# Patient Record
Sex: Female | Born: 1960
Health system: Southern US, Community
[De-identification: ages and names within clinical notes are randomized; demographics above are authoritative.]

## PROBLEM LIST (undated history)

## (undated) DIAGNOSIS — J302 Other seasonal allergic rhinitis: Secondary | ICD-10-CM

## (undated) DIAGNOSIS — K589 Irritable bowel syndrome without diarrhea: Secondary | ICD-10-CM

## (undated) DIAGNOSIS — T7840XA Allergy, unspecified, initial encounter: Secondary | ICD-10-CM

## (undated) DIAGNOSIS — N289 Disorder of kidney and ureter, unspecified: Secondary | ICD-10-CM

## (undated) DIAGNOSIS — C4490 Unspecified malignant neoplasm of skin, unspecified: Secondary | ICD-10-CM

## (undated) DIAGNOSIS — Z8709 Personal history of other diseases of the respiratory system: Secondary | ICD-10-CM

## (undated) DIAGNOSIS — I10 Essential (primary) hypertension: Secondary | ICD-10-CM

## (undated) DIAGNOSIS — F329 Major depressive disorder, single episode, unspecified: Secondary | ICD-10-CM

## (undated) DIAGNOSIS — R030 Elevated blood-pressure reading, without diagnosis of hypertension: Secondary | ICD-10-CM

## (undated) DIAGNOSIS — Z8742 Personal history of other diseases of the female genital tract: Secondary | ICD-10-CM

## (undated) DIAGNOSIS — R7989 Other specified abnormal findings of blood chemistry: Secondary | ICD-10-CM

## (undated) DIAGNOSIS — S83281A Other tear of lateral meniscus, current injury, right knee, initial encounter: Secondary | ICD-10-CM

## (undated) DIAGNOSIS — Q613 Polycystic kidney, unspecified: Secondary | ICD-10-CM

## (undated) DIAGNOSIS — G35 Multiple sclerosis: Secondary | ICD-10-CM

## (undated) DIAGNOSIS — M7989 Other specified soft tissue disorders: Secondary | ICD-10-CM

## (undated) DIAGNOSIS — K219 Gastro-esophageal reflux disease without esophagitis: Secondary | ICD-10-CM

## (undated) DIAGNOSIS — F419 Anxiety disorder, unspecified: Secondary | ICD-10-CM

## (undated) DIAGNOSIS — E785 Hyperlipidemia, unspecified: Secondary | ICD-10-CM

## (undated) DIAGNOSIS — Z8619 Personal history of other infectious and parasitic diseases: Secondary | ICD-10-CM

## (undated) DIAGNOSIS — G4730 Sleep apnea, unspecified: Secondary | ICD-10-CM

## (undated) DIAGNOSIS — M1990 Unspecified osteoarthritis, unspecified site: Secondary | ICD-10-CM

## (undated) DIAGNOSIS — F32A Depression, unspecified: Secondary | ICD-10-CM

## (undated) DIAGNOSIS — G35D Multiple sclerosis, unspecified: Secondary | ICD-10-CM

## (undated) DIAGNOSIS — G4700 Insomnia, unspecified: Secondary | ICD-10-CM

## (undated) DIAGNOSIS — K760 Fatty (change of) liver, not elsewhere classified: Secondary | ICD-10-CM

## (undated) DIAGNOSIS — M25569 Pain in unspecified knee: Secondary | ICD-10-CM

## (undated) DIAGNOSIS — E559 Vitamin D deficiency, unspecified: Secondary | ICD-10-CM

## (undated) HISTORY — DX: Unspecified malignant neoplasm of skin, unspecified: C44.90

## (undated) HISTORY — DX: Allergy, unspecified, initial encounter: T78.40XA

## (undated) HISTORY — DX: Pain in unspecified knee: M25.569

## (undated) HISTORY — DX: Vitamin D deficiency, unspecified: E55.9

## (undated) HISTORY — DX: Other specified soft tissue disorders: M79.89

## (undated) HISTORY — DX: Gastro-esophageal reflux disease without esophagitis: K21.9

## (undated) HISTORY — DX: Multiple sclerosis, unspecified: G35.D

## (undated) HISTORY — DX: Unspecified osteoarthritis, unspecified site: M19.90

## (undated) HISTORY — DX: Other seasonal allergic rhinitis: J30.2

## (undated) HISTORY — PX: COSMETIC SURGERY: SHX468

## (undated) HISTORY — PX: WISDOM TOOTH EXTRACTION: SHX21

## (undated) HISTORY — DX: Elevated blood-pressure reading, without diagnosis of hypertension: R03.0

## (undated) HISTORY — DX: Polycystic kidney, unspecified: Q61.3

## (undated) HISTORY — PX: COLONOSCOPY: SHX174

## (undated) HISTORY — DX: Hyperlipidemia, unspecified: E78.5

## (undated) HISTORY — DX: Personal history of other diseases of the respiratory system: Z87.09

## (undated) HISTORY — DX: Essential (primary) hypertension: I10

## (undated) HISTORY — DX: Insomnia, unspecified: G47.00

## (undated) HISTORY — DX: Multiple sclerosis: G35

## (undated) HISTORY — DX: Disorder of kidney and ureter, unspecified: N28.9

## (undated) HISTORY — DX: Fatty (change of) liver, not elsewhere classified: K76.0

## (undated) HISTORY — DX: Anxiety disorder, unspecified: F41.9

## (undated) HISTORY — DX: Personal history of other infectious and parasitic diseases: Z86.19

## (undated) HISTORY — DX: Sleep apnea, unspecified: G47.30

## (undated) HISTORY — DX: Irritable bowel syndrome, unspecified: K58.9

## (undated) HISTORY — DX: Personal history of other diseases of the female genital tract: Z87.42

## (undated) HISTORY — PX: UPPER GI ENDOSCOPY: SHX6162

## (undated) HISTORY — PX: OTHER SURGICAL HISTORY: SHX169

---
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
## 1983-01-21 HISTORY — PX: UPPER GI ENDOSCOPY: SHX6162

## 1996-01-21 HISTORY — PX: OTHER SURGICAL HISTORY: SHX169

## 1998-01-20 HISTORY — PX: NASAL SINUS SURGERY: SHX719

## 1998-03-01 ENCOUNTER — Ambulatory Visit (HOSPITAL_COMMUNITY): Admission: RE | Admit: 1998-03-01 | Discharge: 1998-03-01 | Payer: Self-pay | Admitting: Otolaryngology

## 1998-03-01 ENCOUNTER — Encounter: Payer: Self-pay | Admitting: Otolaryngology

## 1998-04-06 ENCOUNTER — Ambulatory Visit (HOSPITAL_BASED_OUTPATIENT_CLINIC_OR_DEPARTMENT_OTHER): Admission: RE | Admit: 1998-04-06 | Discharge: 1998-04-06 | Payer: Self-pay | Admitting: Otolaryngology

## 1999-10-30 ENCOUNTER — Encounter: Payer: Self-pay | Admitting: *Deleted

## 1999-10-30 ENCOUNTER — Ambulatory Visit (HOSPITAL_COMMUNITY): Admission: RE | Admit: 1999-10-30 | Discharge: 1999-10-30 | Payer: Self-pay | Admitting: *Deleted

## 2000-04-23 ENCOUNTER — Other Ambulatory Visit: Admission: RE | Admit: 2000-04-23 | Discharge: 2000-04-23 | Payer: Self-pay | Admitting: Internal Medicine

## 2000-05-08 ENCOUNTER — Encounter: Admission: RE | Admit: 2000-05-08 | Discharge: 2000-05-08 | Payer: Self-pay | Admitting: Internal Medicine

## 2000-05-08 ENCOUNTER — Encounter: Payer: Self-pay | Admitting: Internal Medicine

## 2000-07-01 ENCOUNTER — Encounter: Payer: Self-pay | Admitting: Psychiatry

## 2000-07-01 ENCOUNTER — Ambulatory Visit (HOSPITAL_COMMUNITY): Admission: RE | Admit: 2000-07-01 | Discharge: 2000-07-01 | Payer: Self-pay | Admitting: Psychiatry

## 2001-03-09 ENCOUNTER — Encounter: Payer: Self-pay | Admitting: Psychiatry

## 2001-03-09 ENCOUNTER — Ambulatory Visit (HOSPITAL_COMMUNITY): Admission: RE | Admit: 2001-03-09 | Discharge: 2001-03-09 | Payer: Self-pay | Admitting: Psychiatry

## 2001-04-07 ENCOUNTER — Encounter: Admission: RE | Admit: 2001-04-07 | Discharge: 2001-04-07 | Payer: Self-pay | Admitting: Otolaryngology

## 2001-04-07 ENCOUNTER — Encounter: Payer: Self-pay | Admitting: Otolaryngology

## 2001-05-11 ENCOUNTER — Other Ambulatory Visit: Admission: RE | Admit: 2001-05-11 | Discharge: 2001-05-11 | Payer: Self-pay | Admitting: Internal Medicine

## 2001-11-09 ENCOUNTER — Encounter: Admission: RE | Admit: 2001-11-09 | Discharge: 2001-11-09 | Payer: Self-pay | Admitting: Internal Medicine

## 2001-11-09 ENCOUNTER — Encounter: Payer: Self-pay | Admitting: Internal Medicine

## 2002-05-11 ENCOUNTER — Other Ambulatory Visit: Admission: RE | Admit: 2002-05-11 | Discharge: 2002-05-11 | Payer: Self-pay | Admitting: Internal Medicine

## 2004-01-18 ENCOUNTER — Ambulatory Visit (HOSPITAL_COMMUNITY): Admission: RE | Admit: 2004-01-18 | Discharge: 2004-01-18 | Payer: Self-pay | Admitting: Urology

## 2004-12-31 ENCOUNTER — Encounter: Admission: RE | Admit: 2004-12-31 | Discharge: 2004-12-31 | Payer: Self-pay | Admitting: Internal Medicine

## 2005-10-21 ENCOUNTER — Other Ambulatory Visit: Admission: RE | Admit: 2005-10-21 | Discharge: 2005-10-21 | Payer: Self-pay | Admitting: Internal Medicine

## 2005-10-22 ENCOUNTER — Encounter: Admission: RE | Admit: 2005-10-22 | Discharge: 2005-10-22 | Payer: Self-pay | Admitting: Internal Medicine

## 2006-01-06 ENCOUNTER — Ambulatory Visit (HOSPITAL_BASED_OUTPATIENT_CLINIC_OR_DEPARTMENT_OTHER): Admission: RE | Admit: 2006-01-06 | Discharge: 2006-01-06 | Payer: Self-pay | Admitting: Urology

## 2007-01-21 HISTORY — PX: BLADDER SURGERY: SHX569

## 2008-01-21 HISTORY — PX: VAGINAL HYSTERECTOMY: SUR661

## 2008-01-27 ENCOUNTER — Encounter (HOSPITAL_COMMUNITY): Payer: Self-pay | Admitting: Obstetrics and Gynecology

## 2008-01-27 ENCOUNTER — Inpatient Hospital Stay (HOSPITAL_COMMUNITY): Admission: RE | Admit: 2008-01-27 | Discharge: 2008-01-28 | Payer: Self-pay | Admitting: Obstetrics and Gynecology

## 2008-10-08 ENCOUNTER — Ambulatory Visit (HOSPITAL_BASED_OUTPATIENT_CLINIC_OR_DEPARTMENT_OTHER): Admission: RE | Admit: 2008-10-08 | Discharge: 2008-10-08 | Payer: Self-pay | Admitting: Otolaryngology

## 2008-10-14 ENCOUNTER — Ambulatory Visit: Payer: Self-pay | Admitting: Internal Medicine

## 2009-07-10 ENCOUNTER — Encounter: Admission: RE | Admit: 2009-07-10 | Discharge: 2009-07-10 | Payer: Self-pay | Admitting: Psychiatry

## 2009-07-31 ENCOUNTER — Encounter: Admission: RE | Admit: 2009-07-31 | Discharge: 2009-07-31 | Payer: Self-pay | Admitting: Occupational Medicine

## 2009-09-07 ENCOUNTER — Ambulatory Visit (HOSPITAL_BASED_OUTPATIENT_CLINIC_OR_DEPARTMENT_OTHER): Admission: RE | Admit: 2009-09-07 | Discharge: 2009-09-07 | Payer: Self-pay | Admitting: Orthopedic Surgery

## 2010-02-10 ENCOUNTER — Encounter: Payer: Self-pay | Admitting: Internal Medicine

## 2010-05-06 LAB — CBC
HCT: 31.5 % — ABNORMAL LOW (ref 36.0–46.0)
HCT: 38.8 % (ref 36.0–46.0)
Hemoglobin: 13 g/dL (ref 12.0–15.0)
MCHC: 33.5 g/dL (ref 30.0–36.0)
MCV: 86 fL (ref 78.0–100.0)
Platelets: 179 10*3/uL (ref 150–400)
RBC: 4.51 MIL/uL (ref 3.87–5.11)
RDW: 13.3 % (ref 11.5–15.5)
RDW: 13.7 % (ref 11.5–15.5)
WBC: 14.5 10*3/uL — ABNORMAL HIGH (ref 4.0–10.5)

## 2010-05-06 LAB — COMPREHENSIVE METABOLIC PANEL
ALT: 36 U/L — ABNORMAL HIGH (ref 0–35)
BUN: 15 mg/dL (ref 6–23)
CO2: 26 mEq/L (ref 19–32)
Calcium: 9 mg/dL (ref 8.4–10.5)
Creatinine, Ser: 0.7 mg/dL (ref 0.4–1.2)
GFR calc non Af Amer: >60 mL/min (ref >60)
Glucose, Bld: 97 mg/dL (ref 70–99)
Total Protein: 6.7 g/dL (ref 6.0–8.3)

## 2010-05-06 LAB — ABO/RH: ABO/RH(D): O POS

## 2010-05-06 LAB — TYPE AND SCREEN
ABO/RH(D): O POS
Antibody Screen: NEGATIVE

## 2010-06-04 NOTE — Op Note (Signed)
Emily Davenport, Emily Davenport              ACCOUNT NO.:  [id number]   MEDICAL RECORD NO.:  [id number]          PATIENT TYPE:  INP   LOCATION:  9316                          FACILITY:  [facility]   PHYSICIAN:  Zelphia Cairo, MD    DATE OF BIRTH:  02/28/1960   DATE OF PROCEDURE:  01/27/2008  DATE OF DISCHARGE:                               OPERATIVE REPORT   PREOPERATIVE DIAGNOSES:  1. Menorrhagia  2. Pelvic pain.   POSTOPERATIVE DIAGNOSES:  1. Menorrhagia  2. Pelvic pain.  3. Endometriosis.   PROCEDURE:  Laparoscopic-assisted vaginal hysterectomy and bilateral  salpingo-oophorectomy.   SURGEON:  Zelphia Cairo, MD   ASSISTANT:  Freddy Finner, MD   ESTIMATED BLOOD LOSS:  250 mL.   COMPLICATIONS:  None.   FINDINGS:  Endometriosis implants noted on anterior surface of the bowel  and patient's left ovarian fossa.  Significant evidence of inflammation  consistent with history of endometriosis was noted throughout the lower  pelvis.   CONDITION:  Stable to recovery room.   URINE OUTPUT:  200 mL of clear urine.   PROCEDURE IN DETAIL:  Emily Davenport was taken to the operating room where  general anesthesia was found to be adequate.  She was placed in the  dorsal lithotomy position using Allen stirrups.  She was prepped and  draped in the sterile fashion and a Foley catheter was inserted  sterilely.  A bivalve speculum was placed in the vagina and a single-  tooth tenaculum was placed on the anterior lip of the cervix.  Hulka  clamp was placed for uterine manipulation.  Speculum and tenaculum were  removed and our attention was then turned to the abdomen.  An  infraumbilical skin incision was made with the scalpel and extended to  the level of the fascia using a Kelly clamp bluntly.  Optical trocar was  used to penetrate the fascia and peritoneum under direct visualization.  Once intraperitoneal placement was confirmed, CO2 was turned on and a  survey of the abdomen and pelvis were performed.   Liver and gallbladder  appeared normal.  Bilateral ovaries appeared normal.  She did have  evidence of endometriosis with implants noted along the bowel and left  ovarian fossa.  The tissue within the pelvis also appeared somewhat  thickened consistent with a history of endometriosis and inflammation.  A suprapubic incision was made with a scalpel and a 5-mm trocar was  inserted under direct visualization.  A blunt probe was placed and the  patient was placed in Trendelenburg position.  The bowel was swept free  and the cul-de-sac was found to be clear.  Our attention was turned to  the right adnexa.  A blunt grasper was used to grab the right adnexa and  tented medially.  Right ureter was visualized and free from our  operative field.  The EnSeal device was used to cauterize and cut the  infundibulopelvic ligament.  Hemostasis was assured.  Mesosalpinx and  broad ligament were dissected staying just adjacent to the fallopian  tube and ovary down to the level of the round ligament.  The round  ligament was grasped, cut, and cauterized using the EnSeal device.  Once  hemostasis was assured, our attention was then turned to the left  adnexa.  The left adnexa was grasped and tented upward and medially.  Infundibulopelvic ligament was grasped, cauterized, and cut using the  EnSeal.  This was extended down the level of the mesosalpinx and broad  ligament to the level of the round ligament.  The round ligament was  then grasped, cauterized, and cut using EnSeal.  Hemostasis was assured  and our attention was then turned to the vagina.  CO2 was turned off and  all instruments were removed from the abdomen.   Hulka clamp was removed.  A weighted speculum was placed in the  posterior cul-de-sac and a Deaver was placed in the anterior vagina.  The cervix was grasped with a toothed tenaculum and tented upwards.  The  posterior cul-de-sac was entered sharply using Mayo scissors.  A long  weighted  speculum was then placed in the posterior cul-de-sac.  A  circumferential incision was then made around the cervix.  Bilateral  uterosacral ligaments were clamped with curved Heaney clamps, cut with  Mayo scissors, and suture ligated using 0 Vicryl.  Once hemostasis was  assured, cardinal ligaments were then clamped, transected, and suture  ligated bilaterally.  The uterine arteries and broad ligaments were then  serially clamped with the Heaney clamps, transected and suture ligated.  Anterior cul-de-sac was entered sharply using Mayo scissors.  The Deaver  was placed anteriorly.  The uterus was grasped with a thyroid tenaculum  and delivered through the posterior cul-de-sac.  Cornua were clamped  with Heaney clamps, transected, and the uterus was delivered and passed  off to be sent to Pathology.  Vicryl was used to suture ligate the  remaining pedicles.  Clamps were removed.  The posterior vaginal cuff  was then reapproximated using a running locked stitch of Vicryl.  The  remainder of the vaginal cuff was closed with figure-of-eight stitches  using 0 Monocryl in an interrupted fashion.  Once hemostasis was  assured, our attention was then returned to the abdomen.   Hysteroscope was reinserted into the abdomen.  All pedicles were  inspected and found to be hemostatic.  The pelvis was irrigated and  again vaginal cuff and pedicles appeared hemostatic.  All trocars and  instruments were then removed from the abdomen.  The fascia of the  infraumbilical incision was reapproximated with a figure-of-eight stitch  of 0 Vicryl.  The skin was reapproximated with 3-0 Vicryl.  The patient  was extubated and taken to the recovery room in stable condition.      Zelphia Cairo, MD  Electronically Signed     GA/MEDQ  D:  01/27/2008  T:  01/28/2008  Job:  [id number]

## 2010-06-07 NOTE — Op Note (Signed)
[name], Emily Davenport              ACCOUNT NO.:  [id number]   MEDICAL RECORD NO.:  [id number]          PATIENT TYPE:  AMB   LOCATION:  [facility]C                         FACILITY:  [facility]   PHYSICIAN:  Jamison Neighbor, M.D.  DATE OF BIRTH:  06/02/1960   DATE OF PROCEDURE:  01/06/2006  DATE OF DISCHARGE:                               OPERATIVE REPORT   SERVICE:  Urology.   PREOPERATIVE DIAGNOSIS:  Stress urinary incontinence.   POSTOPERATIVE DIAGNOSIS:  Stress urinary incontinence.   PROCEDURE:  Cystoscopy and transobturator sling Conservation officer, historic buildings).   SURGEON:  Dr. Marcelyn Bruins.   ANESTHESIA:  General.   COMPLICATIONS:  None.   DRAINS:  None.   BRIEF HISTORY:  This 50 year old female underwent urodynamic testing  because of urinary incontinence. She has clear cut stress incontinence  without any evidence of urgency or frequency.  The patient had no  evidence of cystocele or significant rectocele requiring additional  reconstruction. She is now to undergo placement of a sling.  The patient  understands the risks and benefits of the procedure and gave full  informed consent.   DESCRIPTION OF PROCEDURE:  After successful induction of general  anesthesia, the patient was placed in the dorsal position, prepped with  Betadine and draped in the usual sterile fashion.  A Foley catheter was  inserted and the bladder was placed to straight drainage.  A weighted  vaginal speculum was placed.  The area over the mid urethra was  infiltrated with local anesthetic and an incision was made over the mid  urethra extending back towards the bladder neck. Flaps were raised  bilaterally going back to but not through the endopelvic fascia. An  incision was made in the groin crease at the level of the clitoris and a  curved needle was passed from the skin incision to the vaginal opening  bilaterally.  Care was taken to ensure that this did not penetrate the  posterior sulcus. Cystoscopy was  performed first with the 70 degree lens  and then with the 12 degree which showed there was no injury to the  bladder.  The sling was then placed and brought out through the two  incisions. The tension was set by keeping a right angled clamp  underneath the sling. Inspection showed that the bladder neck was well  coapted and with a full bladder and Crede maneuver, there was a straight  flow of urine without deviation. The sheath was cut away and the sling  tension was set. The ends were trimmed at that the skin and the skin  incisions were closed with Dermabond.  The vaginal area was irrigated  with antibiotic solution and then closed with a 2-0 Vicryl stitch. The  patient tolerated the procedure well and was taken to the recovery room  in good condition.  Packing was left in place, Foley catheter  was removed.  The patient will be given the opportunity to urinate and  if she urinates normally she will be sent home without a catheter.  She  has a prescription for Tylox and doxycycline. She will return to the  office in 2-3 weeks time.           ______________________________  Jamison Neighbor, M.D.  Electronically Signed     RJE/MEDQ  D:  01/06/2006  T:  01/06/2006  Job:  [id number]

## 2010-06-14 ENCOUNTER — Encounter: Payer: Self-pay | Admitting: Family Medicine

## 2010-06-14 ENCOUNTER — Ambulatory Visit (INDEPENDENT_AMBULATORY_CARE_PROVIDER_SITE_OTHER): Payer: Worker's Compensation | Admitting: Family Medicine

## 2010-06-14 DIAGNOSIS — S93401A Sprain of unspecified ligament of right ankle, initial encounter: Secondary | ICD-10-CM

## 2010-06-14 DIAGNOSIS — M7671 Peroneal tendinitis, right leg: Secondary | ICD-10-CM | POA: Insufficient documentation

## 2010-06-14 DIAGNOSIS — S93409A Sprain of unspecified ligament of unspecified ankle, initial encounter: Secondary | ICD-10-CM

## 2010-06-14 DIAGNOSIS — M7750 Other enthesopathy of unspecified foot: Secondary | ICD-10-CM

## 2010-06-14 DIAGNOSIS — M25571 Pain in right ankle and joints of right foot: Secondary | ICD-10-CM | POA: Insufficient documentation

## 2010-06-14 MED ORDER — NITROGLYCERIN 0.2 MG/HR TD PT24: MEDICATED_PATCH | TRANSDERMAL | Status: DC

## 2010-06-14 NOTE — Assessment & Plan Note (Signed)
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 - Reviewed home exercise program as noted above and initiated nitroglycerin protocol - Should use her ASO brace for added support with activity. Feel her work boots will give her ankle reasonable stability. - Followup with [place] in 6 weeks for reevaluation.

## 2010-06-14 NOTE — Assessment & Plan Note (Signed)
Chronic right peroneal tendinitis status post right inversion ankle sprain 06/30/09 - MSK ultrasound today showed significant amount of fluid surrounding the peroneal tendons, but no visible signs of tendon tear. Does also appear to have small tear in syndesmosis consistent with a high ankle sprain which likely occurred during injury. - Given chronic nature of symptoms will start her on nitroglycerin protocol - explain common side effects to patient and she expressed understanding. - Reviewed home exercise program to focus on strengthening - handout given, should do these exercises daily. - Would avoid any heavy running or impact activities for the next 3-4 weeks, it is okay to use stationary bike. - Should ice after activity. - May continue work without any restrictions. - Followup with [place] in 6 weeks for reevaluation

## 2010-06-14 NOTE — Progress Notes (Signed)
  Subjective:    Patient ID: Emily Davenport, female    DOB: 06-06-1960, 50 y.o.   MRN: [id number]  HPI 50 year old female referred to the office by Dr. Lucia Gaskins for evaluation of right ankle pain. 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  She had immediate pain and swelling and this has persisted for the past several months. Initial x-rays from 07/31/09 showed no signs of fracture or other abnormality. Completed extensive physical therapy without significant improvement. She has returned to work at a full duty capacity but notices increasing pain and lateral ankle swelling at the end of the day. She will have occasional snapping and popping in the anterior lateral aspect of her ankle. He recently completed 6-8 sessions of physical therapy with home exercise program, range of motion, balance exercises, but she was not making any progress. She's not currently taking any medications. Was initially using an ASO brace, but is not doing so at this time. Denies any previous injury to her foot/ankle.  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  All records from [facility] reviewed during visit today & will be scanned into patient chart.  Review of Systems  per history of present illness, otherwise negative     Objective:   Physical Exam  GENERAL: Alert and oriented x3, no acute chest, pleasant, well-developed, well-nourished SKIN: No rashes or lesions MSK:  - Ankle: Right ankle with full range of motion with mild pain with resisted inversion and eversion. Noticeable soft tissue swelling anterior laterally over the ATFL and peroneal tendons posterior to the lateral malleolus. She is tender to palpation over the  ATFL, syndesmosis, peroneal tendons posterior to the lateral malleolus. She has no tenderness over the lateral or medial malleolus or the navicular. 1+ anterior drawer and 1+ talar tilt. Positive Kleiger, negative Squeeze.  +4/5 weakness of the ankle with inversion, eversion, dorsiflexion, plantar flexion. No tenderness over the calcaneus or the Achilles. Left ankle with full range of motion without pain, swelling, tenderness, weakness, instability. - Gait: Normal gait without a limp  Neurovascularly intact distally   MSK ultrasound: Right ankle with normal-appearing medial and lateral talar domes. Normal-appearing ATFL. Appears to have hypoechoic change with in syndesmosis consistent with small tear in the syndesmosis.  Peroneal tendons are well visualized in long and transverse views with large amounts of hypoechoic fluid surrounding tendons and area posterior to lateral malleolus extending to the insertion on the base of the fifth metatarsal. No signs of peroneal tearing. No significant increased Doppler flow appreciated. Images saved.        Assessment & Plan:

## 2010-06-14 NOTE — Patient Instructions (Addendum)
1) You have chronic peroneal tendonitis & small tear of your ankle syndesmosis. 2) Start using Nitroglycerin patch apply 1/4 patch to right ankle daily.  Change every 24-hours.  Most common side effects are rash and/or headache.  If headache occurs will usually be within 1st 2-weeks, but should respond to tylenol or ibuprofen.  Call with any questions. 3) Ice at end of the day 4) Ok to use stationary bike 5) Do exercises as shown - stretches, heel raises, balance, & strengthening exercises 6) Follow-up in 6-weeks for repeat ultrasound 7) Call with any questions/concerns.

## 2010-07-26 ENCOUNTER — Ambulatory Visit: Payer: Worker's Compensation | Admitting: Family Medicine

## 2010-09-16 ENCOUNTER — Other Ambulatory Visit: Payer: Self-pay | Admitting: Psychiatry

## 2010-09-16 DIAGNOSIS — G35 Multiple sclerosis: Secondary | ICD-10-CM

## 2010-09-24 ENCOUNTER — Ambulatory Visit
Admission: RE | Admit: 2010-09-24 | Discharge: 2010-09-24 | Disposition: A | Discharge status: Home / Self Care | Payer: 59 | Source: Ambulatory Visit | Attending: Psychiatry | Admitting: Psychiatry

## 2010-09-24 DIAGNOSIS — G35 Multiple sclerosis: Secondary | ICD-10-CM

## 2010-09-24 MED ORDER — GADOBENATE DIMEGLUMINE 529 MG/ML IV SOLN
20.0000 mL | Freq: Once | INTRAVENOUS | Status: AC | PRN
  Administered 2010-09-24: 20 mL via INTRAVENOUS

## 2010-10-02 ENCOUNTER — Encounter: Payer: 59 | Admitting: Sports Medicine

## 2010-10-08 ENCOUNTER — Encounter: Payer: Self-pay | Admitting: Sports Medicine

## 2010-10-08 ENCOUNTER — Ambulatory Visit (INDEPENDENT_AMBULATORY_CARE_PROVIDER_SITE_OTHER): Payer: 59 | Admitting: Sports Medicine

## 2010-10-08 VITALS — BP 120/70

## 2010-10-08 DIAGNOSIS — M25579 Pain in unspecified ankle and joints of unspecified foot: Secondary | ICD-10-CM

## 2010-10-08 DIAGNOSIS — M25571 Pain in right ankle and joints of right foot: Secondary | ICD-10-CM

## 2010-10-08 MED ORDER — MELOXICAM 15 MG PO TABS: 15.0000 mg | ORAL_TABLET | Freq: Every day | ORAL | Status: AC

## 2010-10-08 NOTE — Progress Notes (Addendum)
  Subjective:    Patient ID: Emily Davenport, female    DOB: [birth year], 50 y.o.   MRN: [id number]  HPI The patient comes in with complaints of right ankle pain that she has had since June 11 of 2011. Her employer is [employer] city [employer]. She initially remembers an inversion injury, while at work, and initial x-rays were negative. Since then she has been undergoing extensive physical therapy, has been seen here once in the past, and was prescribed nitroglycerin patches. Ultrasound at that point showed a peroneal tendinitis. She returns today, approximately 4 months after her last visit, no better. She has been doing the rehabilitation exercises, as well as using nitroglycerin as prescribed. She describes the pain or dominantly over the medial and lateral shoulders of the tibialis on the right ankle. She gets swelling, catching, freezing, and popping.     Review of Systems    no fevers, chills, night sweats, weight loss. Objective:   Physical Exam General: Well-developed, overweight Caucasian female in no acute distress. Skin: Warm and dry. Neuro: Alert and oriented x3, extraocular muscles intact. Respiratory: No use of accessory muscles. MSK: Right Ankle: No visible erythema and minimal swelling. Range of motion is full in all directions. Strength is 5/5 in all directions. She does have some pain with resisted eversion Stable lateral and medial ligaments; squeeze test and kleiger test unremarkable; Talar dome very tender to palpation over medial and lateral shoulders No pain at base of 5th MT; No tenderness over cuboid; No tenderness over N spot or navicular prominence No tenderness on posterior aspects of lateral and medial malleolus No sign of peroneal tendon subluxations or tenderness to palpation Negative tarsal tunnel tinel's Able to walk 4 steps.  MSK ultrasound: Imaged the posterior tibial tendons, peroneal tendons, extensor tendons, ankle joint. I still noted some fluid  around her peroneal tendons, however I was able to follow them down to their insertions. There were no signs of the tear noted and there is no tenderness on palpation of the tendons. Posterior tibialis, flexor hallucis, flexor digitorum tendons were all intact without halos. There was a small amount of fluid in the ankle joint itself. The lateral talar dome was unremarkable, however the medial talar dome did show some cortical irregularity.  Real-time Ultrasound Guided Injection of: Right Ankle joint Verbal Informed Consent obtained. Time-out conducted. Noted no overlying erythema, induration, or other signs of local infection. Prepped in a sterile fashion with alcohol. Local anesthesia: None With sterile technique and under real time ultrasound guidance: The needle was seen in a short axis advancing into the joint space. I took an image of the needle in the joint space, as well as after injection of medication. Completed without difficulty Meds:  1 cc Depo-Medrol, 2 cc lidocaine 1%. Needle: 25 g Pain almost completely resolved. Advised to call if fevers/chills, erythema, induration, drainage, or persistent bleeding.                    Assessment & Plan:

## 2010-10-08 NOTE — Assessment & Plan Note (Addendum)
I suspect that her peroneal tendinitis has resolved. The nitroglycerin patches were ineffective in this patient and will be discontinued. With her symptoms of catching, as well as with pain at the talar domes, as well as a cortical irregularity on ultrasound I do have a worry of a chondral lesion of the talar dome. Ultrasound guided corticosteroid injection of the ankle joint was performed today for diagnostic as well as therapeutic benefit. She did have a significant improvement in her pain after the injection suggesting an intra-articular process. She will take it easy, with no walking or standing more than 30 minutes for a week. She will also take meloxicam 15 mg daily for pain. I will see her back in a period of 2 weeks, and if better we'll release her, if no better will MRI ankle.

## 2010-10-23 ENCOUNTER — Encounter: Payer: 59 | Admitting: Sports Medicine

## 2010-11-05 ENCOUNTER — Ambulatory Visit (INDEPENDENT_AMBULATORY_CARE_PROVIDER_SITE_OTHER): Payer: Self-pay | Admitting: Sports Medicine

## 2010-11-05 VITALS — BP 124/84

## 2010-11-05 DIAGNOSIS — M25571 Pain in right ankle and joints of right foot: Secondary | ICD-10-CM

## 2010-11-05 DIAGNOSIS — M25579 Pain in unspecified ankle and joints of unspecified foot: Secondary | ICD-10-CM

## 2010-11-05 NOTE — Patient Instructions (Signed)
Great to see you, I am glad you are feeling significantly better. Continue wearing sports insoles. We will do an MRI of your ankle. I will let you know what the results are, and what we need to do after that. Come back to see me in 6 weeks.  Ihor Austin. Benjamin Stain, M.D. [facility] [address], [place] [address] [phone]

## 2010-11-05 NOTE — Assessment & Plan Note (Addendum)
Improved significantly since intra-articular ankle injection at last visit.  However the persistent pain and last visit's US findings make me concerned for a chondral defect of the medial talar dome .  Will order an MRI of the ankle to confirm any chondral defect. If Chondral defect found, will likely need ankle arthroscopy. Patient is to continue to tame Meloxicam for the pain.  If MRI negative, patient likely meets MMI.    I agree with the above documentation and evaluation by the resident. Ihor Austin. Benjamin Stain, M.D.

## 2010-11-05 NOTE — Progress Notes (Signed)
  Subjective:    Patient ID: Emily Davenport, female    DOB: 11-13-1960, 50 y.o.   MRN: [id number]  HPI  Emily Davenport comes in for followup of her right ankle injury. She says that since the injection she here ankle it is feeling 75% better. She says that she had that pain in the middle of her ankle as well as on the medial side, and it feels better in the middle, where the injection was placed. She feels like on a medial side it is still a little swollen. She says that it hurts particularly when she tries to wear high heels and when she does her home exercises inversion exercises hurt.  She is taking meloxicam for the pain, which she says helps. She has also been using the green sports and soles and says they have helped a lot. She is still doing a home exercise program for her ankle.  She is working her regular job right now, and she is a Emergency planning/management officer at [employer].    Review of Systems  per history of present illness.     Objective:   Physical Exam BP 124/84 General appearance: alert, cooperative and no distress MSK: Right ankle tenderness anterior to the medial malleolus Right ankle pain with inversion and resistance Negative drawer test of right ankle and no ligamentous laxity noted.       Assessment & Plan:

## 2010-11-05 NOTE — Progress Notes (Signed)
Of note she is significant tenderness to palpation of the medial talar dome. Is no signs of an ankle effusion. She no longer has tenderness to palpation over her peroneal muscles. Negative Kleiger, negative squeeze, negative anterior drawer. She ambulates with a nonantalgic gait.

## 2010-11-19 ENCOUNTER — Encounter: Payer: Self-pay | Admitting: Sports Medicine

## 2010-11-19 ENCOUNTER — Ambulatory Visit (INDEPENDENT_AMBULATORY_CARE_PROVIDER_SITE_OTHER): Payer: Worker's Compensation | Admitting: Sports Medicine

## 2010-11-19 VITALS — BP 130/84

## 2010-11-19 DIAGNOSIS — M25571 Pain in right ankle and joints of right foot: Secondary | ICD-10-CM

## 2010-11-19 DIAGNOSIS — M25579 Pain in unspecified ankle and joints of unspecified foot: Secondary | ICD-10-CM

## 2010-11-19 NOTE — Progress Notes (Signed)
  Subjective:    Patient ID: Emily Davenport, female    DOB: 01/30/1960, 50 y.o.   MRN: [id number]  HPI R ankle injected 6 wks ago. 75% better, however this seems to have plateaued. MRI was obtained of the ankle, which shows some sinus tarsi syndrome, as well as a small amount of fluid in the joint. Otherwise negative. We did however notice what may be a small synovial cyst at the medial tibiotalar joint.  Social history: No alcohol, tobacco, drugs. Works as a Emergency planning/management officer. Review of Systems    fevers, chills, night sweats, weight loss. Objective:   Physical Exam General: Well-developed, overweight Caucasian female in no acute distress. Skin: Warm and dry. Neuro: Alert and oriented x3, extraocular muscles intact. Respiratory: No use of accessory muscles. MSK: Right Ankle: No visible erythema and minimal swelling. Range of motion is full in all directions. Strength is 5/5 in all directions. She does have some pain with resisted eversion Stable lateral and medial ligaments; squeeze test and kleiger test unremarkable; Talar dome very tender to palpation over medial shoulder No pain at base of 5th MT; No tenderness over cuboid; No tenderness over N spot or navicular prominence No tenderness on posterior aspects of lateral and medial malleolus No sign of peroneal tendon subluxations or tenderness to palpation Negative tarsal tunnel tinel's Able to walk 4 steps.  Real-time Ultrasound Guided Injection of: Right Ankle joint Verbal Informed Consent obtained. Time-out conducted. Noted no overlying erythema, induration, or other signs of local infection. Prepped in a sterile fashion with alcohol. Local anesthesia: None With sterile technique and under real time ultrasound guidance: The needle was seen in a short axis advancing into the joint space medially. I took an image of the needle in the joint space, as well as after injection of medication. Completed without difficulty Meds:  1 cc  Depo-Medrol, 1 cc lidocaine 1%. Needle: 25 g Pain almost completely resolved. Advised to call if fevers/chills, erythema, induration, drainage, or persistent bleeding.     Assessment & Plan:   1. Ankle pain: Status post injection #2 today. I would like her to call us back in about a week and a half to 2 weeks, if better great, if no better it would be time to proceed with ankle arthroscopy and we would do a referral at that time to [facility] for orthopedics, Dr. Luiz Blare.

## 2010-11-27 ENCOUNTER — Encounter: Payer: Self-pay | Admitting: Sports Medicine

## 2010-12-17 ENCOUNTER — Encounter: Payer: 59 | Admitting: Sports Medicine

## 2010-12-17 ENCOUNTER — Telehealth: Payer: Self-pay | Admitting: Sports Medicine

## 2010-12-17 DIAGNOSIS — M25579 Pain in unspecified ankle and joints of unspecified foot: Secondary | ICD-10-CM

## 2010-12-17 NOTE — Telephone Encounter (Addendum)
Please see prior notes, spoke with patient she has not improved despite 2 injections, and has a negative MRI. She needs an ankle arthroscopy. This necessitates a referral to [facility], Dr. Luiz Blare.

## 2010-12-18 NOTE — Telephone Encounter (Signed)
Spoke with Noralee Chars @ city [facility] [place].  Faxed over records, and recommendations from Dr. Karie Schwalbe for referral to Dr. Luiz Blare @ [facility].  She states she will arrange appt and let us know.

## 2012-01-21 HISTORY — PX: COLONOSCOPY: SHX174

## 2012-01-21 LAB — HM COLONOSCOPY: HM Colonoscopy: NORMAL

## 2012-05-31 ENCOUNTER — Other Ambulatory Visit: Payer: Self-pay | Admitting: Internal Medicine

## 2012-05-31 ENCOUNTER — Ambulatory Visit
Admission: RE | Admit: 2012-05-31 | Discharge: 2012-05-31 | Disposition: A | Discharge status: Home / Self Care | Payer: Self-pay | Source: Ambulatory Visit | Attending: Internal Medicine | Admitting: Internal Medicine

## 2012-05-31 DIAGNOSIS — M549 Dorsalgia, unspecified: Secondary | ICD-10-CM

## 2012-06-09 ENCOUNTER — Other Ambulatory Visit: Payer: Self-pay | Admitting: Internal Medicine

## 2012-06-09 DIAGNOSIS — R194 Change in bowel habit: Secondary | ICD-10-CM

## 2012-06-09 DIAGNOSIS — R109 Unspecified abdominal pain: Secondary | ICD-10-CM

## 2012-06-09 DIAGNOSIS — R5383 Other fatigue: Secondary | ICD-10-CM

## 2012-06-09 DIAGNOSIS — R634 Abnormal weight loss: Secondary | ICD-10-CM

## 2012-06-10 ENCOUNTER — Ambulatory Visit
Admission: RE | Admit: 2012-06-10 | Discharge: 2012-06-10 | Disposition: A | Discharge status: Home / Self Care | Payer: 59 | Source: Ambulatory Visit | Attending: Internal Medicine | Admitting: Internal Medicine

## 2012-06-10 ENCOUNTER — Other Ambulatory Visit: Payer: Self-pay | Admitting: Internal Medicine

## 2012-06-10 DIAGNOSIS — R109 Unspecified abdominal pain: Secondary | ICD-10-CM

## 2012-06-10 DIAGNOSIS — R634 Abnormal weight loss: Secondary | ICD-10-CM

## 2012-06-10 DIAGNOSIS — R194 Change in bowel habit: Secondary | ICD-10-CM

## 2012-06-10 DIAGNOSIS — R5383 Other fatigue: Secondary | ICD-10-CM

## 2012-06-10 MED ORDER — IOHEXOL 300 MG/ML  SOLN
100.0000 mL | Freq: Once | INTRAMUSCULAR | Status: AC | PRN
  Administered 2012-06-10: 100 mL via INTRAVENOUS

## 2012-09-10 ENCOUNTER — Telehealth: Payer: Self-pay | Admitting: Hematology & Oncology

## 2012-09-10 NOTE — Telephone Encounter (Signed)
Pt called wants to see Dr. Myna Hidalgo. She said her MD yesterday told her she may have multiple myeloma. I told her to have her MD fax notes and labs. She said she would

## 2012-09-10 NOTE — Telephone Encounter (Signed)
Pt aware Dr. Myna Hidalgo reviewed her chart and said PCP should work her up and that he felt he didn't need to see her at this time.

## 2012-09-13 ENCOUNTER — Telehealth: Payer: Self-pay | Admitting: Hematology & Oncology

## 2012-09-13 NOTE — Telephone Encounter (Signed)
Pt called was seen in ER. She faxed those results. MD look at chart again and said she needs to see PCP. Pt is aware.

## 2012-09-17 ENCOUNTER — Telehealth: Payer: Self-pay | Admitting: Hematology & Oncology

## 2012-09-17 NOTE — Telephone Encounter (Signed)
Pt aware of 9-15 appointment and that she have to wait an hour before she see's MD

## 2012-09-22 ENCOUNTER — Telehealth: Payer: Self-pay | Admitting: Hematology & Oncology

## 2012-09-22 NOTE — Telephone Encounter (Signed)
Pt left message checking if we have any openings to be seen sooner. I left message that we dont at this time.

## 2012-10-04 ENCOUNTER — Ambulatory Visit: Payer: 59

## 2012-10-04 ENCOUNTER — Ambulatory Visit (HOSPITAL_BASED_OUTPATIENT_CLINIC_OR_DEPARTMENT_OTHER): Payer: 59 | Admitting: Hematology & Oncology

## 2012-10-04 ENCOUNTER — Other Ambulatory Visit (HOSPITAL_BASED_OUTPATIENT_CLINIC_OR_DEPARTMENT_OTHER): Payer: 59 | Admitting: Lab

## 2012-10-04 DIAGNOSIS — M549 Dorsalgia, unspecified: Secondary | ICD-10-CM

## 2012-10-04 DIAGNOSIS — D649 Anemia, unspecified: Secondary | ICD-10-CM

## 2012-10-04 LAB — CBC WITH DIFFERENTIAL (CANCER CENTER ONLY)
BASO%: 0.3 % (ref 0.0–2.0)
EOS%: 0.6 % (ref 0.0–7.0)
Eosinophils Absolute: 0.1 10*3/uL (ref 0.0–0.5)
LYMPH%: 34.9 % (ref 14.0–48.0)
MCH: 29.4 pg (ref 26.0–34.0)
MCHC: 33.5 g/dL (ref 32.0–36.0)
MCV: 88 fL (ref 81–101)
MONO%: 6.6 % (ref 0.0–13.0)
NEUT#: 4.6 10*3/uL (ref 1.5–6.5)
Platelets: 235 10*3/uL (ref 145–400)
RDW: 13.1 % (ref 11.1–15.7)

## 2012-10-04 LAB — CHCC SATELLITE - SMEAR

## 2012-10-04 LAB — CMP (CANCER CENTER ONLY)
ALT(SGPT): 15 U/L (ref 10–47)
BUN, Bld: 14 mg/dL (ref 7–22)
CO2: 30 mEq/L (ref 18–33)
Creat: 1 mg/dl (ref 0.6–1.2)
Total Bilirubin: 0.7 mg/dl (ref 0.20–1.60)

## 2012-10-04 NOTE — Progress Notes (Signed)
This office note has been dictated.

## 2012-10-06 LAB — KAPPA/LAMBDA LIGHT CHAINS
Kappa free light chain: 1.05 mg/dL (ref 0.33–1.94)
Kappa:Lambda Ratio: 1.31 (ref 0.26–1.65)
Lambda Free Lght Chn: 0.8 mg/dL (ref 0.57–2.63)

## 2012-10-06 LAB — PROTEIN ELECTROPHORESIS, SERUM, WITH REFLEX
Albumin ELP: 55.9 % (ref 55.8–66.1)
Alpha-1-Globulin: 7.2 % — ABNORMAL HIGH (ref 2.9–4.9)
Alpha-2-Globulin: 9.5 % (ref 7.1–11.8)
Beta 2: 4.5 % (ref 3.2–6.5)
Beta Globulin: 6.1 % (ref 4.7–7.2)
Gamma Globulin: 16.8 % (ref 11.1–18.8)
Total Protein, Serum Electrophoresis: 7.2 g/dL (ref 6.0–8.3)

## 2012-10-06 LAB — IGG, IGA, IGM
IgA: 189 mg/dL (ref 69–380)
IgG (Immunoglobin G), Serum: 1340 mg/dL (ref 690–1700)
IgM, Serum: 81 mg/dL (ref 52–322)

## 2012-10-06 NOTE — Progress Notes (Signed)
I left a message that the blood work dose NOT show any myeloma at all!!  I told her not to worry!!!  Cindee Lame

## 2012-10-13 ENCOUNTER — Other Ambulatory Visit: Payer: Self-pay | Admitting: Hematology & Oncology

## 2012-10-13 DIAGNOSIS — R300 Dysuria: Secondary | ICD-10-CM

## 2012-10-14 ENCOUNTER — Other Ambulatory Visit: Payer: 59 | Admitting: Lab

## 2012-10-18 ENCOUNTER — Other Ambulatory Visit: Payer: Self-pay | Admitting: Lab

## 2012-10-18 DIAGNOSIS — R300 Dysuria: Secondary | ICD-10-CM

## 2012-10-19 NOTE — Progress Notes (Signed)
CC:   Emily Housekeeper, MD  DIAGNOSIS:  Hypercalcemia -- transient.  HISTORY OF PRESENT ILLNESS:  Emily Davenport is a very nice 52 year old white female.  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  She, unfortunately, suffers from severe anxiety.  She is on a few medications for this.  She has been having some issues over the summertime.  She is having some abdominal pain.  There is some weight loss.  She is having some nausea.  She subsequently underwent an MRI of the abdomen.  She was told that she may have __________ pancreatic cancer.  A CA19-9 was normal at 11.  She had an MRI of the abdomen.  This was done in August.  She had a CT scan previously in May.  The MRI shows some hepatomegaly.  The pancreas looked normal.  There was no lymphadenopathy.  An addendum was reported on the MRI and CT scan.  The addendum showed no evidence of any lytic lesions within the viewed bones.  Apparently, the CT of the chest, done back in I think May, again did into show anything that was unremarkable.  She has had some issues with some kidney stones.  She has had some blood in her urine.  She stated that she sees a urologist.  She was then told that she may have myeloma.  From what she said, this is based on a calcium level that was high.  From the labs that I have seen, her calcium level really was not high at all.  The highest that I saw her calcium level was 10.7.  However, her albumin was 4.4, so when corrected, her calcium was normal.  She is worried about myeloma.  She said that she is suffering immensely, not knowing if she has myeloma or not. Other calcium levels that she has had have been normal.  She has had no problems with anemia.  There has been no change in bowel or bladder habits.  There has been no cough.  She has had no headache. There has been no visual issues.  She has not noted any kind of rashes.  Again, she just is incredibly  anxious and worried that she has myeloma.  PAST MEDICAL HISTORY:  Remarkable for: 1. Nephrolithiasis. 2. GERD. 3. Insomnia. 4. Anxiety. 5. Hysterectomy.  ALLERGIES:  Penicillin.  MEDICATIONS:  Catapres 0.1 mg p.o. q. day.  Vitamin B12, 1 mg p.o. q. day.  Lexapro 30 mg p.o. q. day.  Vivelle patch daily.  I think Lunesta 3 mg p.o. q.h.s.  Ativan 0.5 mg p.o. t.i.d.  Remeron 15 mg p.o. q.h.s. Vitamin B6 100 mg p.o. q. day. Vitamin E daily.  Iron daily.  SOCIAL HISTORY:  Negative for tobacco use.  She has rare alcohol use. There are no obvious occupational exposures.  FAMILY HISTORY:  Unremarkable for any type of blood disorder.  There is no cancer.  REVIEW OF SYSTEMS:  Is as stated in the history of present illness.  No additional findings are noted on a 12-system review.  PHYSICAL EXAMINATION:  General:  This is a well-developed, well- nourished white female in no obvious distress.  Vital Signs: Temperature of 97.7, pulse 70, respiratory rate 14, blood pressure 142/92.  Weight is 162 pounds.  Head/Neck:  Shows a normocephalic, atraumatic skull.  There are no ocular or oral lesions.  She has no scleral icterus.  There is no adenopathy in the neck.  Her thyroid is  nonpalpable.  Her pupils react appropriately.  Lungs:  Clear bilaterally.  Cardiac:  Regular rate and rhythm with a normal S1 and S2. There are no murmurs, rubs or bruits.  Abdomen:  Soft.  She has good bowel sounds.  There is no fluid wave.  There is no palpable abdominal mass.  There is no palpable hepatosplenomegaly.  Back:  Exam shows no kyphosis or osteoporotic changes.  There is no tenderness over the spine, ribs or hips.  Extremities:  Show no clubbing, cyanosis or edema. She has good range of motion of her joints.  She has good muscle strength bilaterally.  No venous cord is noted in her legs.  Skin: Shows no rashes, ecchymosis or petechia.  Neurological:  Shows no focal neurological deficits.  LABORATORY  STUDIES:  Show a white cell count of 7.9, hemoglobin 13.4, hematocrit 40, platelet count 235.  MCV is 88.  Total protein is 7.7 with an albumin of 3.9.  Alkaline phosphatase is 104.  Sodium is 141, potassium 3.8, BUN 14, creatinine 1.0.  Calcium is 9.7.  Peripheral smear, which I reviewed, shows a normochromic, normocytic population of red blood cells.  There are no nucleated red blood cells. There are no teardrop cells.  I see no rouleaux formation.  White cells appear normal in morphology and maturation.  There are no immature myeloid or lymphoid forms.  There are no atypical lymphocytes.  I see no blasts.  There are no hypersegmented polys.  Platelets are adequate in number and size.  IMPRESSION:  Emily Davenport is a 52 year old white female.  She had transient hypercalcemia.  The calcium really was not even high when corrected for her albumin.  I just do not see any lab results that would suggest that she has myeloma.  She does not have a low albumin.  Her protein is not high. She is not anemic.  She has had an MRI and CAT scans, which did not show any lytic bone lesions.  Emily Davenport suffers from severe anxiety.  Her life has been taken over by the fear of her having myeloma. I spent a good 1-1/2 hours with her, trying to convince her that I just did not see any evidence that she had myeloma.  Again, I still am awaiting her actual protein studies.  She could not figure out or understand why her lab work would vary.  I told that it depends on the lab that ran the tests and that variants can be normal.  I told her that the ultimate test for myeloma would be a bone marrow biopsy.  However, I just do not see that she would need this.  I suppose that she may have a monoclonal gammopathies of undetermined significance.  If so, this would not be surprising given her health issues and stress, and medications.  I told her that I would call her when I get the results  back. Hopefully, we can give her some peace of mind knowing that she does not have any kind of blood problem.  Again, from the lab work that I have seen, I just do not see any evidence of any kind of marrow disorder.  Hopefully, we will not have to see Emily Davenport back in the office.  She is very nice.  She probably will be the healthiest person I see all week.  I prayed with her in the office.  She wanted me to.  She does have a strong faith, but this is being tested right  now.  Again, I will call her when I have the rest of the lab results back.  I do not see that we need to do a bone survey, a 24-hour urine or a bone marrow biopsy.    ______________________________ Josph Macho, M.D. PRE/MEDQ  D:  10/04/2012  T:  10/19/2012  Job:  [id number]

## 2012-10-20 LAB — UIFE/LIGHT CHAINS/TP QN, 24-HR UR
Albumin, U: DETECTED
Free Lambda Excretion/Day: 0.9 mg/d
Free Lambda Lt Chains,Ur: <0.03 mg/dL (ref 0.02–0.67)
Total Protein, Urine-Ur/day: 30 mg/d (ref 10–140)

## 2012-10-21 ENCOUNTER — Encounter: Payer: Self-pay | Admitting: Hematology & Oncology

## 2012-10-21 ENCOUNTER — Telehealth: Payer: Self-pay | Admitting: *Deleted

## 2012-10-21 NOTE — Telephone Encounter (Signed)
I left a message for her and told her that her 24hr urine was negative for Bence-Jones protein and that she was not putting out any abnormal level of protein!!! (transcribed from Message from Dr. Myna Hidalgo)

## 2012-10-21 NOTE — Telephone Encounter (Signed)
Message copied by Anselm Jungling on Thu Oct 21, 2012 11:12 AM ------      Message from: Arlan Organ R      Created: Wed Oct 20, 2012 12:38 PM       I left a message for her and told her that her 24hr urine was negative for Bence-Jones protein and that she was not putting out any abnormal level of protein!!!  Cindee Lame ------

## 2012-11-25 ENCOUNTER — Other Ambulatory Visit: Payer: Self-pay

## 2012-12-27 NOTE — Progress Notes (Signed)
CC:   Emily Housekeeper, MD  DIAGNOSIS:  Hypercalcemia -- transient.  HISTORY OF PRESENT ILLNESS:  Emily Davenport is a very nice 52 year old white female.  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  She, unfortunately, suffers from severe anxiety.  She is on a few medications for this.  She has been having some issues over the summertime.  She is having some abdominal pain.  There is some weight loss.  She is having some nausea.  She subsequently underwent an MRI of the abdomen.  She was told that she may have pancreatic cancer.  CA 19-9 was normal at 11.  She had an MRI of the abdomen.  This was done in August.  She had a CT scan previously in May.  The MRI showed some hepatomegaly.  Pancreas looked normal.  There was no lymphadenopathy.  An addendum was reported on the MRI and CT scan.  The addendum showed no evidence of any lytic lesions within the viewed bones.  CT of the chest done back in, I think May, again did not show anything that was unremarkable.  She has had some issues with some kidney stones.  She has had some blood in the urine.  She states she sees a urologist.  She was then told that she may have myeloma.  From what she says, this was based on a calcium level that was high.  From the labs that I have seen, her calcium level really was not high at all.  The highest that I saw her calcium level was 10.7.  However, her albumin was 4.4, so when corrected, her calcium was normal.  She is worried about myeloma.  She said that she is suffering immensely, not knowing if she has myeloma or not.  Other calcium levels that she has had have been normal.  She has had no problems with anemia.  There has been no change in bowel or bladder habits.  There has been no cough.  She has had no headache. There have been no visual issues.  She does not have any kind of rashes.  Again, she just is incredibly anxious and worried that she has  myeloma.  PAST MEDICAL HISTORY:  Remarkable for: 1. Nephrolithiasis. 2. GERD. 3. Insomnia. 4. Anxiety. 5. Hysterectomy.  ALLERGIES:  Penicillin.  MEDICATIONS:  Catapres 0.1 mg p.o. daily, vitamin B12 at 1 mg p.o. daily, Lexapro 30 mg p.o. daily, Vivelle patch daily, Lunesta 3 mg p.o. at bedtime, Ativan 0.5 mg p.o. t.i.d., Remeron 15 mg p.o. at bedtime, vitamin B6 at 100 mg p.o. daily, vitamin E daily, iron daily.  SOCIAL HISTORY:  Negative for tobacco use.  She has rare alcohol use. There are no obvious occupational exposures.  FAMILY HISTORY:  Unremarkable for any type of blood disorder.  There is no cancer.  REVIEW OF SYSTEMS:  As stated in history of present illness.  No additional findings are noted on a 12-system review.  PHYSICAL EXAMINATION:  General:  This is a well-developed, well- nourished white female, in no obvious distress.  Vital Signs: Temperature of 97.7, pulse 70, respiratory rate 14, blood pressure 142/92, weight is 162 pounds.  Head and Neck:  Normocephalic, atraumatic skull.  There are no ocular or oral lesions.  She has no scleral icterus.  There is no adenopathy in the neck.  Her thyroid is nonpalpable.  Her pupils react appropriately.  Lungs:  Clear bilaterally.  Cardiac:  Regular rate  and rhythm with a normal S1 and S2. There are no murmurs, rubs, or bruits.  Abdomen:  Soft.  She has good bowel sounds.  There is no fluid wave.  There is no palpable abdominal mass.  There is no palpable hepatosplenomegaly.  Back:  No kyphosis or osteoporotic changes.  There is no tenderness over the spine, ribs, or hips.  Extremities:  No clubbing, cyanosis, or edema.  She has good range motion of her joints.  She has good muscle strength bilaterally. No venous cord is noted in her legs.  Skin:  No rashes, ecchymoses, or petechiae.  Neurologic:  No focal neurological deficits.  LABORATORY STUDIES:  White cell count is 7.9, hemoglobin 13.4, hematocrit 40, platelet  count 235, MCV is 88.  Total protein is 7.7, with an albumin of 3.9, alkaline phosphatase is 104.  Sodium is 141, potassium 3.8, BUN 14, creatinine 1.0.  Calcium is 9.7.  Peripheral smear, which I reviewed, shows a normochromic, normocytic population of red blood cells.  There are no nucleated red blood cells. There are no teardrop cells.  I see no rouleaux formation.  White cells appear normal in morphology and maturation.  There are no immature myeloid or lymphoid forms.  There are no atypical lymphocytes.  I see no blasts.  There are no hypersegmented polys.  Platelets are adequate in number and size.  IMPRESSION:  Emily Davenport is a 52 year old white female.  She had transient hypercalcemia.  The calcium really was not even high, when corrected for her albumin.  I just do not see any lab results that would suggest that she has myeloma.  She does not have a low albumin.  Her protein is not high. She is not anemic.  She has had  MRI and CAT scans which did not show any lytic bone lesions.  Emily Davenport suffers from severe anxiety.  Her life has been taken over by the fear of her having myeloma.  I spent a good hour and a half with her, trying to convince her that I just did not see any evidence that she had myeloma.  Again, I still am awaiting her actual protein studies.  She could not figure out or understand why her lab work would vary.  I told her that it depends on the lab that ran the tests and that variance could be normal.  I told her that the ultimate test for myeloma would be a bone-marrow biopsy.  However, I just do not see that she would need this.  I suppose that she may have a monoclonal gammopathy of unknown significance.  If so, this would not be surprising, given her health issues and stress and medications.  I told her that I would call her when I get the results back. Hopefully, we can give her some peace of mind, knowing that she does not have any kind of  blood problem.  Again from the lab work that I have seen, I just do not see any evidence of any kind of marrow disorder.  Hopefully, we will not have to see Emily Davenport back in the office.  She is very nice.  She probably will be the healthiest person that I see all week.  I prayed with her in the office.  She wanted me to.  She does have a strong faith, but this is being tested right now.  Again, I will call her when I have the rest of the lab results back.  I do not  see that we need to do a bone survey, a 24-hour urine, or a bone marrow biopsy.    ______________________________ Josph Macho, M.D. PRE/MEDQ  D:  10/04/2012  T:  12/26/2012  Job:  [id number]

## 2013-02-16 ENCOUNTER — Other Ambulatory Visit: Payer: Self-pay | Admitting: Gastroenterology

## 2013-02-16 ENCOUNTER — Ambulatory Visit
Admission: RE | Admit: 2013-02-16 | Discharge: 2013-02-16 | Disposition: A | Discharge status: Home / Self Care | Payer: 59 | Source: Ambulatory Visit | Attending: Gastroenterology | Admitting: Gastroenterology

## 2013-02-16 DIAGNOSIS — R1011 Right upper quadrant pain: Secondary | ICD-10-CM

## 2013-03-15 ENCOUNTER — Ambulatory Visit: Payer: 59 | Admitting: Internal Medicine

## 2013-03-28 ENCOUNTER — Encounter: Payer: Self-pay | Admitting: *Deleted

## 2013-03-28 ENCOUNTER — Ambulatory Visit (INDEPENDENT_AMBULATORY_CARE_PROVIDER_SITE_OTHER): Payer: 59 | Admitting: Internal Medicine

## 2013-03-28 ENCOUNTER — Encounter: Payer: Self-pay | Admitting: Internal Medicine

## 2013-03-28 VITALS — BP 120/81 | HR 89 | Temp 97.6°F | Resp 18 | Ht 64.5 in | Wt 187.0 lb

## 2013-03-28 DIAGNOSIS — Z90710 Acquired absence of both cervix and uterus: Secondary | ICD-10-CM

## 2013-03-28 DIAGNOSIS — G35 Multiple sclerosis: Secondary | ICD-10-CM | POA: Insufficient documentation

## 2013-03-28 DIAGNOSIS — N809 Endometriosis, unspecified: Secondary | ICD-10-CM

## 2013-03-28 DIAGNOSIS — E785 Hyperlipidemia, unspecified: Secondary | ICD-10-CM

## 2013-03-28 DIAGNOSIS — N200 Calculus of kidney: Secondary | ICD-10-CM

## 2013-03-28 DIAGNOSIS — K589 Irritable bowel syndrome without diarrhea: Secondary | ICD-10-CM

## 2013-03-28 DIAGNOSIS — F411 Generalized anxiety disorder: Secondary | ICD-10-CM

## 2013-03-28 DIAGNOSIS — Z8742 Personal history of other diseases of the female genital tract: Secondary | ICD-10-CM | POA: Insufficient documentation

## 2013-03-28 DIAGNOSIS — K5790 Diverticulosis of intestine, part unspecified, without perforation or abscess without bleeding: Secondary | ICD-10-CM | POA: Insufficient documentation

## 2013-03-28 DIAGNOSIS — K5730 Diverticulosis of large intestine without perforation or abscess without bleeding: Secondary | ICD-10-CM

## 2013-03-28 DIAGNOSIS — Z9079 Acquired absence of other genital organ(s): Secondary | ICD-10-CM

## 2013-03-28 DIAGNOSIS — K219 Gastro-esophageal reflux disease without esophagitis: Secondary | ICD-10-CM

## 2013-03-28 DIAGNOSIS — G35D Multiple sclerosis, unspecified: Secondary | ICD-10-CM

## 2013-03-28 DIAGNOSIS — F419 Anxiety disorder, unspecified: Secondary | ICD-10-CM | POA: Insufficient documentation

## 2013-03-28 DIAGNOSIS — Z90722 Acquired absence of ovaries, bilateral: Secondary | ICD-10-CM

## 2013-03-28 HISTORY — DX: Personal history of other diseases of the female genital tract: Z87.42

## 2013-03-28 NOTE — Progress Notes (Signed)
Subjective:    Patient ID: Emily Davenport, female    DOB: [birth year], 53 y.o.   MRN: [id number]  HPI  Emily Davenport is a new pt here for first visit to establish primary care.   PMH of severe anxiety, GERD, Hyperlipidemia, IBS, Multiple sclerosis (diagnosed in 2002 - no progression),  Transient Hypercalcemia,  Renal Calculus, hematuria, and endometriosis.  Emily Davenport has seen 8 different specialists for various reasons and has had multiple diagnostic MRI, CT's and Ultrasound.    When I asked her to prioritize her concerns  pt states  "I think I'm dying"    She knows she has anxiety but tells me she is constantly on Internet searching for a new medical diagnosis.   Most recently had see Dr. Marin Olp for concern of MM but he felt she had transient minimal hypercalcemia.  Someone mentioned she had hepatomegaly on imaging and she went home to look this up on computer and is now convinced she has amyloidosis.   She is worried that her ALk phos is 118  - an normal level  Of note her U/S of 02/16/2013 and abd CT of 06/10/2012 makes no mention of hepatomegaly.  For her anxiety she takes Ativan TID.   She tells me she has been on Remeron  In the past.  Her psychiatrist is Dr. Corena Pilgrim in [place].   She repeatedly states during interview  "Please don't think I am crazy."  Of note her most recent labs done1/28/2015 are all normal with normal calcium and normal  Hepatic profile  See scanned labs Dr. Lorenda Hatchet  Allergies  Allergen Reactions  . Augmentin [Amoxicillin-Pot Clavulanate]     diarrhea   Past Medical History  Diagnosis Date  . Insomnia   . Anxiety   . Multiple sclerosis    Past Surgical History  Procedure Laterality Date  . Bladder surgery    . Nasal sinus surgery    . Rhinoplasty    . Abdominal hysterectomy    . Planterfaciitis     History   Social History  . Marital Status: Married    Spouse Name: N/A    Number of Children: N/A  . Years of Education: N/A   Occupational History    . Not on file.   Social History Main Topics  . Smoking status: Never Smoker   . Smokeless tobacco: Never Used  . Alcohol Use: Yes     Comment: ocasionally  . Drug Use: No  . Sexual Activity: Yes   Other Topics Concern  . Not on file   Social History Narrative  . No narrative on file   Family History  Problem Relation Age of Onset  . Kidney disease Mother   . Osteoporosis Mother   . Hypertension Father   . Cancer Father     throat  . Hypertension Sister    Patient Active Problem List   Diagnosis Date Noted  . Anxiety 03/28/2013  . GERD (gastroesophageal reflux disease) 03/28/2013  . Renal calculi 03/28/2013  . Hyperlipidemia 03/28/2013  . IBS (irritable bowel syndrome) 03/28/2013  . Endometriosis 03/28/2013  . S/P total hysterectomy and BSO (bilateral salpingo-oophorectomy) 03/28/2013  . Multiple sclerosis 03/28/2013  . Diverticulosis 03/28/2013  . Right ankle pain 06/14/2010   Current Outpatient Prescriptions on File Prior to Visit  Medication Sig Dispense Refill  . cloNIDine (CATAPRES) 0.1 MG tablet Take 0.1 mg by mouth every morning.      . Cyanocobalamin (VITAMIN B 12 PO) Take 1,000 mg by  mouth every morning.      . Estradiol (VIVELLE-DOT TD) Place onto the skin every morning.       . Eszopiclone 3 MG TABS Take 3 mg by mouth at bedtime. Take immediately before bedtime      . LORazepam (ATIVAN) 0.5 MG tablet Take 0.5 mg by mouth 3 (three) times daily.        Marland Kitchen pyridOXINE (VITAMIN B-6) 100 MG tablet Take 100 mg by mouth daily.      . Vitamin E 200 UNITS TABS Take by mouth every morning.      Marland Kitchen FERROUS GLUCONATE PO Take by mouth every morning.      . Melatonin 10 MG TABS Take by mouth every morning.       No current facility-administered medications on file prior to visit.      Review of Systems See HPI    Objective:   Physical Exam Physical Exam  Nursing note and vitals reviewed.  Constitutional: She is oriented to person, place, and time. She appears  well-developed and well-nourished.  HENT:  Head: Normocephalic and atraumatic.  Cardiovascular: Normal rate and regular rhythm. Exam reveals no gallop and no friction rub.  No murmur heard.  Pulmonary/Chest: Breath sounds normal. She has no wheezes. She has no rales.  Neurological: She is alert and oriented to person, place, and time.  Skin: Skin is warm and dry.  Psychiatric: She has a normal mood and affect. Her behavior is normal.          Assessment & Plan:  Anxiety  /  Probable OCD/ Probable hypochondriasis or other psychiatric disorder:   She compulsively uses Internet to search for medical illnesses and I do not think Ativan is controlling this very well.   She has been on Remeron in the past.  Will need old records.    Renal calculus left kidney  Multiple sclerosis   See Brain MRI  Initially diagnosed in 2002 but most recent MRI's do not show any progression.   She sees Dr. Gorden Harms  ?? Hepatomegaly  I see no mention of hepatomegaly on most recent CT and U/S (01/2013)  I told pt I see no clinical evidence that she has amyloidosis.  She  IBS  GERD  See me in 2-3 weeks

## 2013-03-28 NOTE — Patient Instructions (Signed)
30 minute appt in 2 weeks

## 2013-03-29 ENCOUNTER — Telehealth: Payer: Self-pay | Admitting: Internal Medicine

## 2013-03-29 NOTE — Telephone Encounter (Signed)
  Emily Davenport  Call pt and let her know that I do not think she has amyloidosis . Advise her to keep her next appt with me and bring her husband with her.   I will check her cholesterol at that visit.

## 2013-03-31 NOTE — Telephone Encounter (Signed)
Notified pt of lab results and Dr DDS findings

## 2013-04-01 ENCOUNTER — Other Ambulatory Visit: Payer: Self-pay | Admitting: *Deleted

## 2013-04-11 ENCOUNTER — Ambulatory Visit (INDEPENDENT_AMBULATORY_CARE_PROVIDER_SITE_OTHER): Payer: 59 | Admitting: Internal Medicine

## 2013-04-11 ENCOUNTER — Encounter: Payer: Self-pay | Admitting: Internal Medicine

## 2013-04-11 VITALS — BP 123/83 | HR 78 | Temp 97.4°F | Resp 16 | Wt 184.0 lb

## 2013-04-11 DIAGNOSIS — G4700 Insomnia, unspecified: Secondary | ICD-10-CM | POA: Insufficient documentation

## 2013-04-11 DIAGNOSIS — F419 Anxiety disorder, unspecified: Secondary | ICD-10-CM

## 2013-04-11 DIAGNOSIS — F411 Generalized anxiety disorder: Secondary | ICD-10-CM

## 2013-04-11 MED ORDER — ESZOPICLONE 3 MG PO TABS: 3.0000 mg | ORAL_TABLET | Freq: Every day | ORAL | Status: DC

## 2013-04-11 MED ORDER — CLONAZEPAM 1 MG PO TABS: 1.0000 mg | ORAL_TABLET | Freq: Two times a day (BID) | ORAL | Status: DC

## 2013-04-11 MED ORDER — CLONIDINE HCL 0.1 MG PO TABS: ORAL_TABLET | ORAL | Status: DC

## 2013-04-11 NOTE — Progress Notes (Signed)
Subjective:    Patient ID: Emily Davenport, female    DOB: May 10, 1960, 53 y.o.   MRN: [id number]  HPI Emily Davenport is here for follow up .  She is with her husband Emily Davenport.    She still reports lots of anxiety .  Pt reports she "wakes up every day and wishes the day was over so she could go back to sleep"  Denies depression but has obssesive thougts about potential illness.  Husband states if she is not worried about her health she will start on the children's health.    Using Ativan 3-4 times a day and Lunesta at nigh to help her sleep.  She denies hallucinations, delusional thinking or other psychotic features.   Pt.  Never been hospitalized for psychiatric reasons  Allergies  Allergen Reactions  . Augmentin [Amoxicillin-Pot Clavulanate]     diarrhea   Past Medical History  Diagnosis Date  . Insomnia   . Anxiety   . Multiple sclerosis    Past Surgical History  Procedure Laterality Date  . Bladder surgery    . Nasal sinus surgery    . Rhinoplasty    . Abdominal hysterectomy    . Planterfaciitis     History   Social History  . Marital Status: Married    Spouse Name: N/A    Number of Children: N/A  . Years of Education: N/A   Occupational History  . Not on file.   Social History Main Topics  . Smoking status: Never Smoker   . Smokeless tobacco: Never Used  . Alcohol Use: Yes     Comment: ocasionally  . Drug Use: No  . Sexual Activity: Yes   Other Topics Concern  . Not on file   Social History Narrative  . No narrative on file   Family History  Problem Relation Age of Onset  . Kidney disease Mother   . Osteoporosis Mother   . Hypertension Father   . Cancer Father     throat  . Hypertension Sister    Patient Active Problem List   Diagnosis Date Noted  . Anxiety 03/28/2013  . GERD (gastroesophageal reflux disease) 03/28/2013  . Renal calculi 03/28/2013  . Hyperlipidemia 03/28/2013  . IBS (irritable bowel syndrome) 03/28/2013  . Endometriosis 03/28/2013  .  S/P total hysterectomy and BSO (bilateral salpingo-oophorectomy) 03/28/2013  . Multiple sclerosis 03/28/2013  . Diverticulosis 03/28/2013  . Right ankle pain 06/14/2010   Current Outpatient Prescriptions on File Prior to Visit  Medication Sig Dispense Refill  . Cyanocobalamin (VITAMIN B 12 PO) Take 1,000 mg by mouth every morning.      . Estradiol (VIVELLE-DOT TD) Place onto the skin every morning.       Marland Kitchen. FERROUS GLUCONATE PO Take by mouth every morning.      Marland Kitchen. LORazepam (ATIVAN) 0.5 MG tablet Take 0.5 mg by mouth 3 (three) times daily.        . Melatonin 10 MG TABS Take by mouth every morning.      . pyridOXINE (VITAMIN B-6) 100 MG tablet Take 100 mg by mouth daily.      Marland Kitchen. saccharomyces boulardii (FLORASTOR) 250 MG capsule Take 250 mg by mouth daily.      . Vitamin E 200 UNITS TABS Take by mouth every morning.       No current facility-administered medications on file prior to visit.      Review of Systems    see HPI Objective:   Physical Exam  Physical  Exam  Nursing note and vitals reviewed.   Tearful during interview.   Constitutional: She is oriented to person, place, and time. She appears well-developed and well-nourished.  HENT:  Head: Normocephalic and atraumatic.  Cardiovascular: Normal rate and regular rhythm. Exam reveals no gallop and no friction rub.  No murmur heard.  Pulmonary/Chest: Breath sounds normal. She has no wheezes. She has no rales.  Neurological: She is alert and oriented to person, place, and time.  Skin: Skin is warm and dry.  Psychiatric: She has a normal mood and affect. Her behavior is normal.       Assessment & Plan:  GAD/?? OCD/ hypochondriasis:     I gave pt. Number to Dr. Nolen Mu and Dr. Evelene Croon for diagnostic eval and treamment.  I also gave number to two therapists  C.W.  And N.G    Will give longer acting  BZP.  Klonopin 1 mg bid .  Hold Ativan. When starting klonopin.  Advised not to drive when starting klonopin  If too much drowsiness  she is to call me in office  HTN  Refill clonidine 0.1 mg bid  INsomnia due to above  Refill lunesta OK to take nightly

## 2013-04-11 NOTE — Patient Instructions (Signed)
Dr.  Emerson Monte:  [phone] psychiatrist  Dr. Fae Pippin :  [phone]  Psychiatrist  Therapist  Quentin Mulling Cristela Felt  Therapist:  Vonna Kotyk    Take Klonopin  Every twelve hours beginning in am.  HOld Ativan after starting Klonopin  -  Call me if too sleepy  See me in 4 weeks   30 mins

## 2013-04-13 ENCOUNTER — Ambulatory Visit: Payer: 59 | Admitting: Internal Medicine

## 2013-04-13 ENCOUNTER — Encounter: Payer: Self-pay | Admitting: Internal Medicine

## 2013-04-15 ENCOUNTER — Other Ambulatory Visit: Payer: Self-pay | Admitting: *Deleted

## 2013-04-15 ENCOUNTER — Telehealth: Payer: Self-pay | Admitting: *Deleted

## 2013-04-15 NOTE — Telephone Encounter (Signed)
Sent My Chart message to pt regarding her request for advice about appt with psychiatrist

## 2013-04-27 ENCOUNTER — Encounter: Payer: Self-pay | Admitting: Internal Medicine

## 2013-04-28 ENCOUNTER — Other Ambulatory Visit: Payer: Self-pay | Admitting: *Deleted

## 2013-04-28 ENCOUNTER — Telehealth: Payer: Self-pay | Admitting: *Deleted

## 2013-04-28 DIAGNOSIS — E785 Hyperlipidemia, unspecified: Secondary | ICD-10-CM

## 2013-04-28 NOTE — Telephone Encounter (Signed)
Notified pt that referral for nutrition has been completed and that she should be receiving a phone call from them

## 2013-05-09 ENCOUNTER — Ambulatory Visit: Payer: 59 | Admitting: Internal Medicine

## 2013-06-08 ENCOUNTER — Telehealth: Payer: Self-pay | Admitting: *Deleted

## 2013-06-16 ENCOUNTER — Encounter: Payer: Self-pay | Admitting: Dietician

## 2013-06-16 ENCOUNTER — Encounter: Payer: 59 | Attending: Internal Medicine | Admitting: Dietician

## 2013-06-16 VITALS — Ht 64.0 in | Wt 203.5 lb

## 2013-06-16 DIAGNOSIS — E669 Obesity, unspecified: Secondary | ICD-10-CM | POA: Insufficient documentation

## 2013-06-16 DIAGNOSIS — Z6834 Body mass index (BMI) 34.0-34.9, adult: Secondary | ICD-10-CM | POA: Insufficient documentation

## 2013-06-16 DIAGNOSIS — E785 Hyperlipidemia, unspecified: Secondary | ICD-10-CM | POA: Insufficient documentation

## 2013-06-16 DIAGNOSIS — Z713 Dietary counseling and surveillance: Secondary | ICD-10-CM | POA: Insufficient documentation

## 2013-06-16 NOTE — Patient Instructions (Signed)
Try to walk 10-15 minutes 2 x week to start.  Plan to eat something small in the morning with protein and carbohydrate (yogurt, boiled egg or cheese and toast or fruit and nuts).  Aim to add vegetables to lunch and dinner (pre-washed veggies or microwave). If you use canned vegetables rinse off the salt.  Limit saturated fats (high fat meat, high fat dairy, butter) and trans fats (pastries, fried foods, packaged cookies). When going out to eat, consider boxing up half of your meal at the beginning and choose one starch with meal (load up on vegetables). Pay attention to hunger/fullness feelings. Eat a snack if you hungry. Don't eat at night if you aren't hungry.

## 2013-06-16 NOTE — Progress Notes (Signed)
  Medical Nutrition Therapy:  Appt start time: 1115 end time:  1215.   Assessment:  Primary concerns today: Emily Davenport is here today since her cholesterol is high and she is concerned about her weight. In 01/2013 total cholesterol was 256 mg/dl and LDL was 485 mg/dl. Triglycerides and HDL are normal range. Not currently taking medication for cholesterol. Had lost weight (50 lbs) last year due to stress and regained the same amount back since she had her anxiety medication adjusted. States she lost weight since she was not eating. Not feeling as much stress as she was before.  Would like to be at "normal" weight for her height but does not have a specific weight loss goal in mind. Lives with her husband and son and states that she splits the food preparation with her husband. [name] in not currently working.   Working on not skipping breakfast, but is not hungry in the morning. Skips 4-5 x week. Eats about 5-6 meals per week.  Feels more hungry after dinner and may have "junk". States she is not a good planner of meals. Working on using less condiments. Stays up late and gets up around 9:30 AM.   States that she doesn't like to do physical activity. Has some issues with her knees. May be willing to walk.   Preferred Learning Style:   No preference indicated   Learning Readiness:   Ready  MEDICATIONS: see list   DIETARY INTAKE:  Usual eating pattern includes 2 meals and 1-2 snacks per day.  24-hr recall:  B ( AM): skips 5-6 times per week, 2 pieces of cheese toast or 2 pop tarts  Snk ( AM): none L ( PM): sandwich with ham and cheese or [place] or peanut butter Snk ( PM): sometimes may have a yogurt D ( PM): variety of foods, eats out a lot, may have cheeseburgers, chicken Snk ( PM): yogurt or swiss rolls Beverages: water with flavor packet, water, Diet Coke at [employer], or half sweet tea  Usual physical activity: occaisionally will walk dogs  Estimated energy needs: 1600 calories 180 g  carbohydrates 120 g protein 44 g fat  Progress Towards Goal(s):  In progress.   Nutritional Diagnosis:  Arroyo-3.3 Overweight/obesity As related to hx of meal skipping and high intake of fast foods.  As evidenced by BMI of 34.9 and hyperlipidemia.    Intervention:  Nutrition counseling provided. Plan: Try to walk 10-15 minutes 2 x week to start.  Plan to eat something small in the morning with protein and carbohydrate (yogurt, boiled egg or cheese and toast or fruit and nuts).  Aim to add vegetables to lunch and dinner (pre-washed veggies or microwave). If you use canned vegetables rinse off the salt.  Limit saturated fats (high fat meat, high fat dairy, butter) and trans fats (pastries, fried foods, packaged cookies). When going out to eat, consider boxing up half of your meal at the beginning and choose one starch with meal (load up on vegetables). Pay attention to hunger/fullness feelings. Eat a snack if you hungry. Don't eat at night if you aren't hungry.   Teaching Method Utilized:  Visual Auditory Hands on  Handouts given during visit include:  MyPlate Handout  15 g CHO Snacks  Nutrition label  Barriers to learning/adherence to lifestyle change: doesn't like to do physical activity  Demonstrated degree of understanding via:  Teach Back   Monitoring/Evaluation:  Dietary intake, exercise, and body weight in 2 month(s).

## 2013-06-21 ENCOUNTER — Telehealth: Payer: Self-pay | Admitting: *Deleted

## 2013-06-21 NOTE — Telephone Encounter (Signed)
Pt calls stating that she is taking the clonidine for sleep. And not hypertension. She would like this noted on the rx instructions due to life insurance.

## 2013-08-12 ENCOUNTER — Encounter (HOSPITAL_BASED_OUTPATIENT_CLINIC_OR_DEPARTMENT_OTHER): Payer: Self-pay | Admitting: *Deleted

## 2013-08-12 NOTE — Progress Notes (Signed)
No cardiac or resp problems-anxiety causes mild htn-takes meds

## 2013-08-15 ENCOUNTER — Encounter (HOSPITAL_BASED_OUTPATIENT_CLINIC_OR_DEPARTMENT_OTHER): Payer: Self-pay | Admitting: Physician Assistant

## 2013-08-15 ENCOUNTER — Ambulatory Visit (HOSPITAL_BASED_OUTPATIENT_CLINIC_OR_DEPARTMENT_OTHER): Payer: 59 | Admitting: Certified Registered"

## 2013-08-15 ENCOUNTER — Encounter (HOSPITAL_BASED_OUTPATIENT_CLINIC_OR_DEPARTMENT_OTHER)
Admission: RE | Disposition: A | Discharge status: Home / Self Care | Payer: Self-pay | Source: Ambulatory Visit | Attending: Orthopedic Surgery

## 2013-08-15 ENCOUNTER — Ambulatory Visit (HOSPITAL_BASED_OUTPATIENT_CLINIC_OR_DEPARTMENT_OTHER)
Admission: RE | Admit: 2013-08-15 | Discharge: 2013-08-15 | Disposition: A | Discharge status: Home / Self Care | Payer: 59 | Source: Ambulatory Visit | Attending: Orthopedic Surgery | Admitting: Orthopedic Surgery

## 2013-08-15 ENCOUNTER — Encounter (HOSPITAL_BASED_OUTPATIENT_CLINIC_OR_DEPARTMENT_OTHER): Payer: 59 | Admitting: Certified Registered"

## 2013-08-15 DIAGNOSIS — F411 Generalized anxiety disorder: Secondary | ICD-10-CM | POA: Insufficient documentation

## 2013-08-15 DIAGNOSIS — K589 Irritable bowel syndrome without diarrhea: Secondary | ICD-10-CM | POA: Insufficient documentation

## 2013-08-15 DIAGNOSIS — N200 Calculus of kidney: Secondary | ICD-10-CM | POA: Diagnosis not present

## 2013-08-15 DIAGNOSIS — S83281A Other tear of lateral meniscus, current injury, right knee, initial encounter: Secondary | ICD-10-CM

## 2013-08-15 DIAGNOSIS — G35 Multiple sclerosis: Secondary | ICD-10-CM | POA: Insufficient documentation

## 2013-08-15 DIAGNOSIS — M659 Unspecified synovitis and tenosynovitis, unspecified site: Secondary | ICD-10-CM | POA: Insufficient documentation

## 2013-08-15 DIAGNOSIS — F419 Anxiety disorder, unspecified: Secondary | ICD-10-CM | POA: Diagnosis present

## 2013-08-15 DIAGNOSIS — K5790 Diverticulosis of intestine, part unspecified, without perforation or abscess without bleeding: Secondary | ICD-10-CM | POA: Diagnosis present

## 2013-08-15 DIAGNOSIS — M23359 Other meniscus derangements, posterior horn of lateral meniscus, unspecified knee: Secondary | ICD-10-CM | POA: Insufficient documentation

## 2013-08-15 DIAGNOSIS — G4700 Insomnia, unspecified: Secondary | ICD-10-CM | POA: Diagnosis present

## 2013-08-15 DIAGNOSIS — K5730 Diverticulosis of large intestine without perforation or abscess without bleeding: Secondary | ICD-10-CM | POA: Diagnosis not present

## 2013-08-15 DIAGNOSIS — E785 Hyperlipidemia, unspecified: Secondary | ICD-10-CM | POA: Diagnosis not present

## 2013-08-15 DIAGNOSIS — F329 Major depressive disorder, single episode, unspecified: Secondary | ICD-10-CM | POA: Insufficient documentation

## 2013-08-15 DIAGNOSIS — F3289 Other specified depressive episodes: Secondary | ICD-10-CM | POA: Diagnosis not present

## 2013-08-15 DIAGNOSIS — K219 Gastro-esophageal reflux disease without esophagitis: Secondary | ICD-10-CM | POA: Insufficient documentation

## 2013-08-15 DIAGNOSIS — M2240 Chondromalacia patellae, unspecified knee: Secondary | ICD-10-CM | POA: Diagnosis not present

## 2013-08-15 HISTORY — DX: Other tear of lateral meniscus, current injury, right knee, initial encounter: S83.281A

## 2013-08-15 HISTORY — DX: Major depressive disorder, single episode, unspecified: F32.9

## 2013-08-15 HISTORY — PX: KNEE ARTHROSCOPY WITH MEDIAL MENISECTOMY: SHX5651

## 2013-08-15 HISTORY — DX: Depression, unspecified: F32.A

## 2013-08-15 LAB — POCT HEMOGLOBIN-HEMACUE: HEMOGLOBIN: 14.2 g/dL (ref 12.0–15.0)

## 2013-08-15 SURGERY — ARTHROSCOPY, KNEE, WITH MEDIAL MENISCECTOMY
Anesthesia: General | Site: Knee | Laterality: Right

## 2013-08-15 MED ORDER — ONDANSETRON HCL 4 MG/2ML IJ SOLN: 4.0000 mg | Freq: Once | INTRAMUSCULAR | Status: DC | PRN

## 2013-08-15 MED ORDER — CEFAZOLIN SODIUM-DEXTROSE 2-3 GM-% IV SOLR
2.0000 g | INTRAVENOUS | Status: AC
Start: 2013-08-15 — End: 2013-08-15
  Administered 2013-08-15: 2 g via INTRAVENOUS

## 2013-08-15 MED ORDER — ONDANSETRON HCL 4 MG/2ML IJ SOLN
INTRAMUSCULAR | Status: DC | PRN
  Administered 2013-08-15: 4 mg via INTRAVENOUS

## 2013-08-15 MED ORDER — CEFAZOLIN SODIUM-DEXTROSE 2-3 GM-% IV SOLR
INTRAVENOUS | Status: AC
  Filled 2013-08-15: qty 50

## 2013-08-15 MED ORDER — HYDROMORPHONE HCL PF 1 MG/ML IJ SOLN
0.2500 mg | INTRAMUSCULAR | Status: DC | PRN
Start: 2013-08-15 — End: 2013-08-15
  Administered 2013-08-15 (×2): 0.5 mg via INTRAVENOUS

## 2013-08-15 MED ORDER — FENTANYL CITRATE 0.05 MG/ML IJ SOLN
INTRAMUSCULAR | Status: AC
  Filled 2013-08-15: qty 2

## 2013-08-15 MED ORDER — HYDROCODONE-ACETAMINOPHEN 5-325 MG PO TABS: ORAL_TABLET | ORAL | Status: DC

## 2013-08-15 MED ORDER — LIDOCAINE HCL (CARDIAC) 20 MG/ML IV SOLN
INTRAVENOUS | Status: DC | PRN
  Administered 2013-08-15: 50 mg via INTRAVENOUS

## 2013-08-15 MED ORDER — FENTANYL CITRATE 0.05 MG/ML IJ SOLN
INTRAMUSCULAR | Status: AC
  Filled 2013-08-15: qty 6

## 2013-08-15 MED ORDER — DEXAMETHASONE SODIUM PHOSPHATE 4 MG/ML IJ SOLN
INTRAMUSCULAR | Status: DC | PRN
  Administered 2013-08-15: 8 mg via INTRAVENOUS

## 2013-08-15 MED ORDER — MIDAZOLAM HCL 2 MG/2ML IJ SOLN
INTRAMUSCULAR | Status: AC
  Filled 2013-08-15: qty 2

## 2013-08-15 MED ORDER — BUPIVACAINE-EPINEPHRINE (PF) 0.25% -1:200000 IJ SOLN
INTRAMUSCULAR | Status: AC
  Filled 2013-08-15: qty 30

## 2013-08-15 MED ORDER — OXYCODONE HCL 5 MG PO TABS
5.0000 mg | ORAL_TABLET | Freq: Once | ORAL | Status: AC | PRN
  Administered 2013-08-15: 5 mg via ORAL

## 2013-08-15 MED ORDER — HYDROMORPHONE HCL PF 1 MG/ML IJ SOLN
INTRAMUSCULAR | Status: AC
  Filled 2013-08-15: qty 1

## 2013-08-15 MED ORDER — FENTANYL CITRATE 0.05 MG/ML IJ SOLN
50.0000 ug | INTRAMUSCULAR | Status: DC | PRN
  Administered 2013-08-15: 100 ug via INTRAVENOUS

## 2013-08-15 MED ORDER — CHLORHEXIDINE GLUCONATE 4 % EX LIQD: 60.0000 mL | Freq: Once | CUTANEOUS | Status: DC

## 2013-08-15 MED ORDER — MIDAZOLAM HCL 2 MG/2ML IJ SOLN
1.0000 mg | INTRAMUSCULAR | Status: DC | PRN
  Administered 2013-08-15: 2 mg via INTRAVENOUS

## 2013-08-15 MED ORDER — MIDAZOLAM HCL 5 MG/5ML IJ SOLN
INTRAMUSCULAR | Status: DC | PRN
  Administered 2013-08-15: 2 mg via INTRAVENOUS

## 2013-08-15 MED ORDER — EPINEPHRINE HCL 1 MG/ML IJ SOLN
INTRAMUSCULAR | Status: AC
  Filled 2013-08-15: qty 1

## 2013-08-15 MED ORDER — OXYCODONE HCL 5 MG PO TABS
ORAL_TABLET | ORAL | Status: AC
  Filled 2013-08-15: qty 1

## 2013-08-15 MED ORDER — LACTATED RINGERS IV SOLN
INTRAVENOUS | Status: DC
  Administered 2013-08-15: 10 mL/h via INTRAVENOUS
  Administered 2013-08-15: 11:00:00 via INTRAVENOUS

## 2013-08-15 MED ORDER — PROPOFOL 10 MG/ML IV BOLUS
INTRAVENOUS | Status: DC | PRN
  Administered 2013-08-15: 200 mg via INTRAVENOUS

## 2013-08-15 MED ORDER — SODIUM CHLORIDE 0.9 % IR SOLN
Status: DC | PRN
Start: 2013-08-15 — End: 2013-08-15
  Administered 2013-08-15: 6000 mL

## 2013-08-15 MED ORDER — OXYCODONE HCL 5 MG/5ML PO SOLN: 5.0000 mg | Freq: Once | ORAL | Status: AC | PRN

## 2013-08-15 MED ORDER — FENTANYL CITRATE 0.05 MG/ML IJ SOLN
INTRAMUSCULAR | Status: DC | PRN
  Administered 2013-08-15: 100 ug via INTRAVENOUS

## 2013-08-15 MED ORDER — EPINEPHRINE HCL 1 MG/ML IJ SOLN
INTRAMUSCULAR | Status: DC | PRN
  Administered 2013-08-15: 1 mg

## 2013-08-15 SURGICAL SUPPLY — 43 items
BANDAGE ELASTIC 6 VELCRO ST LF (GAUZE/BANDAGES/DRESSINGS) ×3 IMPLANT
BLADE CUTTER GATOR 3.5 (BLADE) ×3 IMPLANT
BLADE GREAT WHITE 4.2 (BLADE) IMPLANT
BLADE GREAT WHITE 4.2MM (BLADE)
BLADE SURG 15 STRL LF DISP TIS (BLADE) IMPLANT
BLADE SURG 15 STRL SS (BLADE)
BNDG COHESIVE 4X5 TAN STRL (GAUZE/BANDAGES/DRESSINGS) IMPLANT
CANISTER SUCT 3000ML (MISCELLANEOUS) IMPLANT
DRAPE ARTHROSCOPY W/POUCH 90 (DRAPES) ×3 IMPLANT
DURAPREP 26ML APPLICATOR (WOUND CARE) ×3 IMPLANT
GAUZE SPONGE 4X4 12PLY STRL (GAUZE/BANDAGES/DRESSINGS) ×3 IMPLANT
GAUZE XEROFORM 1X8 LF (GAUZE/BANDAGES/DRESSINGS) ×3 IMPLANT
GLOVE BIO SURGEON STRL SZ7 (GLOVE) ×3 IMPLANT
GLOVE BIOGEL PI IND STRL 7.0 (GLOVE) ×2 IMPLANT
GLOVE BIOGEL PI IND STRL 7.5 (GLOVE) ×1 IMPLANT
GLOVE BIOGEL PI INDICATOR 7.0 (GLOVE) ×4
GLOVE BIOGEL PI INDICATOR 7.5 (GLOVE) ×2
GLOVE ECLIPSE 6.5 STRL STRAW (GLOVE) ×3 IMPLANT
GLOVE SS BIOGEL STRL SZ 7.5 (GLOVE) ×1 IMPLANT
GLOVE SUPERSENSE BIOGEL SZ 7.5 (GLOVE) ×2
GOWN STRL REUS W/ TWL LRG LVL3 (GOWN DISPOSABLE) ×3 IMPLANT
GOWN STRL REUS W/TWL LRG LVL3 (GOWN DISPOSABLE) ×9
HOLDER KNEE FOAM BLUE (MISCELLANEOUS) ×3 IMPLANT
KNEE WRAP E Z 3 GEL PACK (MISCELLANEOUS) ×3 IMPLANT
MANIFOLD NEPTUNE II (INSTRUMENTS) ×2 IMPLANT
NDL SAFETY ECLIPSE 18X1.5 (NEEDLE) ×1 IMPLANT
NEEDLE HYPO 18GX1.5 SHARP (NEEDLE) ×3
NEEDLE HYPO 22GX1.5 SAFETY (NEEDLE) IMPLANT
PACK ARTHROSCOPY DSU (CUSTOM PROCEDURE TRAY) ×3 IMPLANT
PACK BASIN DAY SURGERY FS (CUSTOM PROCEDURE TRAY) ×3 IMPLANT
PAD ALCOHOL SWAB (MISCELLANEOUS) IMPLANT
SET ARTHROSCOPY TUBING (MISCELLANEOUS) ×3
SET ARTHROSCOPY TUBING LN (MISCELLANEOUS) ×1 IMPLANT
SUCTION FRAZIER TIP 10 FR DISP (SUCTIONS) IMPLANT
SUT ETHILON 4 0 PS 2 18 (SUTURE) ×3 IMPLANT
SUT PROLENE 3 0 PS 2 (SUTURE) IMPLANT
SUT VIC AB 3-0 PS1 18 (SUTURE)
SUT VIC AB 3-0 PS1 18XBRD (SUTURE) IMPLANT
SYRINGE 20CC LL (MISCELLANEOUS) IMPLANT
SYRINGE 6CC (MISCELLANEOUS) ×3 IMPLANT
TOWEL OR 17X24 6PK STRL BLUE (TOWEL DISPOSABLE) ×3 IMPLANT
WAND STAR VAC 90 (SURGICAL WAND) IMPLANT
WATER STERILE IRR 1000ML POUR (IV SOLUTION) ×3 IMPLANT

## 2013-08-15 NOTE — Progress Notes (Signed)
Assisted Dr. Crews with right, knee block. Side rails up, monitors on throughout procedure. See vital signs in flow sheet. Tolerated Procedure well. 

## 2013-08-15 NOTE — Discharge Instructions (Addendum)
Arthroscopic Procedure, Knee °An arthroscopic procedure can find what is wrong with your knee. °PROCEDURE °Arthroscopy is a surgical technique that allows your orthopedic surgeon to diagnose and treat your knee injury with accuracy. They will look into your knee through a small instrument. This is almost like a small (pencil sized) telescope. Because arthroscopy affects your knee less than open knee surgery, you can anticipate a more rapid recovery. Taking an active role by following your caregiver's instructions will help with rapid and complete recovery. Use crutches, rest, elevation, ice, and knee exercises as instructed. The length of recovery depends on various factors including type of injury, age, physical condition, medical conditions, and your rehabilitation. °Your knee is the joint between the large bones (femur and tibia) in your leg. Cartilage covers these bone ends which are smooth and slippery and allow your knee to bend and move smoothly. Two menisci, thick, semi-lunar shaped pads of cartilage which form a rim inside the joint, help absorb shock and stabilize your knee. Ligaments bind the bones together and support your knee joint. Muscles move the joint, help support your knee, and take stress off the joint itself. Because of this all programs and physical therapy to rehabilitate an injured or repaired knee require rebuilding and strengthening your muscles. °AFTER THE PROCEDURE °· After the procedure, you will be moved to a recovery area until most of the effects of the medication have worn off. Your caregiver will discuss the test results with you. °· Only take over-the-counter or prescription medicines for pain, discomfort, or fever as directed by your caregiver. °SEEK MEDICAL CARE IF:  °· You have increased bleeding from your wounds. °· You see redness, swelling, or have increasing pain in your wounds. °· You have pus coming from your wound. °· You have an oral temperature above 102° F (38.9°  C). °· You notice a bad smell coming from the wound or dressing. °· You have severe pain with any motion of your knee. °SEEK IMMEDIATE MEDICAL CARE IF:  °· You develop a rash. °· You have difficulty breathing. °· You have any allergic problems. °Document Released: 01/04/2000 Document Revised: 03/31/2011 Document Reviewed: 07/28/2007 °[employer]® Patient Information ©2015 [employer]. This information is not intended to replace advice given to you by your health care provider. Make sure you discuss any questions you have with your health care provider. ° °Post Anesthesia Home Care Instructions ° °Activity: °Get plenty of rest for the remainder of the day. A responsible adult should stay with you for 24 hours following the procedure.  °For the next 24 hours, DO NOT: °-Drive a car °-Operate machinery °-Drink alcoholic beverages °-Take any medication unless instructed by your physician °-Make any legal decisions or sign important papers. ° °Meals: °Start with liquid foods such as gelatin or soup. Progress to regular foods as tolerated. Avoid greasy, spicy, heavy foods. If nausea and/or vomiting occur, drink only clear liquids until the nausea and/or vomiting subsides. Call your physician if vomiting continues. ° °Special Instructions/Symptoms: °Your throat may feel dry or sore from the anesthesia or the breathing tube placed in your throat during surgery. If this causes discomfort, gargle with warm salt water. The discomfort should disappear within 24 hours. ° °

## 2013-08-15 NOTE — H&P (Signed)
Emily PrimerRobyn S Davenport is an 53 y.o. female.   Chief Complaint: right knee pain  HPI: [id number] you female injured her right knee with repetat ive squatting while painting.  Right knee MRI show patella femoral chondromalacia with a lateral meniscus tear.  Past Medical History  Diagnosis Date  . Insomnia   . Anxiety   . Multiple sclerosis   . GERD (gastroesophageal reflux disease)   . Hyperlipidemia   . Depression   . Tear of lateral meniscus of right knee     Past Surgical History  Procedure Laterality Date  . Bladder surgery  2009    sling  . Nasal sinus surgery  2000  . Rhinoplasty  1979  . Planterfaciitis  1998    right  . Vaginal hysterectomy  2010  . Colonoscopy    . Upper gi endoscopy      Family History  Problem Relation Age of Onset  . Kidney disease Mother   . Osteoporosis Mother   . Hypertension Father   . Cancer Father     throat  . Hypertension Sister    Social History:  reports that she has never smoked. She has never used smokeless tobacco. She reports that she drinks alcohol. She reports that she does not use illicit drugs.  Allergies:  Allergies  Allergen Reactions  . Augmentin [Amoxicillin-Pot Clavulanate]     diarrhea    Medications Prior to Admission  Medication Sig Dispense Refill  . ranitidine (ZANTAC) 150 MG tablet Take 150 mg by mouth as needed for heartburn.      . clonazePAM (KLONOPIN) 1 MG tablet Take 1 tablet (1 mg total) by mouth 2 (two) times daily.  60 tablet  0  . cloNIDine (CATAPRES) 0.1 MG tablet Take one tablet twice a day  60 tablet  1  . Cyanocobalamin (VITAMIN B 12 PO) Take 1,000 mg by mouth every morning.      Marland Kitchen. doxepin (SINEQUAN) 75 MG capsule Take 75 mg by mouth at bedtime.       . Estradiol (VIVELLE-DOT TD) Place onto the skin every morning.       Marland Kitchen. FERROUS GLUCONATE PO Take by mouth every morning.      . Multiple Vitamins-Minerals (MULTIVITAMIN & MINERAL PO) Take by mouth.      . pyridOXINE (VITAMIN B-6) 100 MG tablet Take 100 mg by  mouth daily.      . Vitamin E 200 UNITS TABS Take by mouth every morning.        No results found for this or any previous visit (from the past 48 hour(s)). No results found.  Review of Systems  Constitutional: Negative.   HENT: Negative.   Eyes: Negative.   Respiratory: Negative.   Cardiovascular: Negative.   Gastrointestinal: Negative.   Genitourinary: Negative.   Musculoskeletal: Positive for joint pain.       Right knee  Skin: Negative.   Neurological: Negative.   Endo/Heme/Allergies: Negative.   Psychiatric/Behavioral: Negative.     Height 5\' 4"  (1.626 m), weight 92.08 kg (203 lb). Physical Exam  Constitutional: She is oriented to person, place, and time. She appears well-developed and well-nourished.  HENT:  Head: Normocephalic and atraumatic.  Mouth/Throat: Oropharynx is clear and moist.  Eyes: Conjunctivae and EOM are normal. Pupils are equal, round, and reactive to light.  Neck: Neck supple.  Cardiovascular: Normal rate.   Respiratory: Effort normal.  GI: Soft.  Genitourinary:  Not pertinent to current symptomatology therefore not examined.  Musculoskeletal:  Examination  of the right knee reveals pain laterally and in the retropatella region. 2+ crepitus. Positive lateral McMurray's 1+ effusion.  Full range of motion.  Knee is stable with normal patella tracking. Left knee has full range of motion without pain swelling or instability.  Neurological: She is alert and oriented to person, place, and time.  Skin: Skin is warm and dry.  Psychiatric: She has a normal mood and affect.     Assessment Principal Problem:   Tear of lateral meniscus of right knee Active Problems:   Anxiety   GERD (gastroesophageal reflux disease)   Renal calculi   Hyperlipidemia   IBS (irritable bowel syndrome)   Multiple sclerosis   Diverticulosis   Insomnia    Plan Right knee arthroscopy with partial lateral meniscectomy.  The risks, benefits, and possible complications of  the procedure were discussed in detail with the patient.  The patient is without question.  Buster Schueller J 08/15/2013, 9:33 AM

## 2013-08-15 NOTE — Anesthesia Postprocedure Evaluation (Signed)
  Anesthesia Post-op Note  Patient: Emily Davenport  Procedure(s) Performed: Procedure(s): RIGHT KNEE ARTHROSCOPY WITH DEBRIDEMENT/SHAVING (CHRONDRPLASTY), MEDIAL AND LATERAL MENISECTOMY (Right)  Patient Location: PACU  Anesthesia Type:General  Level of Consciousness: awake, alert  and oriented  Airway and Oxygen Therapy: Patient Spontanous Breathing  Post-op Pain: mild  Post-op Assessment: Post-op Vital signs reviewed  Post-op Vital Signs: Reviewed  Last Vitals:  Filed Vitals:   08/15/13 1303  BP: 116/70  Pulse: 61  Temp: 36.4 C  Resp: 18    Complications: No apparent anesthesia complications

## 2013-08-15 NOTE — Anesthesia Preprocedure Evaluation (Signed)
Anesthesia Evaluation  Patient identified by MRN, date of birth, ID band Patient awake    Reviewed: Allergy & Precautions, H&P , NPO status , Patient's Chart, lab work & pertinent test results  Airway Mallampati: I  TM Distance: >3 FB Neck ROM: Full    Dental  (+) Teeth Intact, Dental Advisory Given   Pulmonary  breath sounds clear to auscultation        Cardiovascular Rhythm:Regular Rate:Normal     Neuro/Psych    GI/Hepatic GERD-  Medicated and Controlled,  Endo/Other    Renal/GU      Musculoskeletal   Abdominal   Peds  Hematology   Anesthesia Other Findings   Reproductive/Obstetrics                             Anesthesia Physical Anesthesia Plan  ASA: II  Anesthesia Plan: General   Post-op Pain Management:    Induction: Intravenous  Airway Management Planned: LMA  Additional Equipment:   Intra-op Plan:   Post-operative Plan: Extubation in OR  Informed Consent: I have reviewed the patients History and Physical, chart, labs and discussed the procedure including the risks, benefits and alternatives for the proposed anesthesia with the patient or authorized representative who has indicated his/her understanding and acceptance.   Dental advisory given  Plan Discussed with: CRNA, Anesthesiologist and Surgeon  Anesthesia Plan Comments:         Anesthesia Quick Evaluation  

## 2013-08-15 NOTE — Anesthesia Procedure Notes (Addendum)
Procedure Name: LMA Insertion Performed by: Lance CoonWEBSTER, Emily Pre-anesthesia Checklist: Patient identified, Emergency Drugs available, Suction available and Patient being monitored Patient Re-evaluated:Patient Re-evaluated prior to inductionOxygen Delivery Method: Circle System Utilized Preoxygenation: Pre-oxygenation with 100% oxygen Intubation Type: IV induction Ventilation: Mask ventilation without difficulty LMA: LMA inserted LMA Size: 3.0 Number of attempts: 1 Airway Equipment and Method: bite block Placement Confirmation: positive ETCO2 Tube secured with: Tape Dental Injury: Teeth and Oropharynx as per pre-operative assessment     At Dr. Sherene SiresWainer's request I performed an injection of the Right Knee joint. Under sterile conditions, using a 22g needle,  20 ml of 0.5% Marcaine was given with 10 ml in each lower portal. Tolerated well.

## 2013-08-15 NOTE — Transfer of Care (Signed)
Immediate Anesthesia Transfer of Care Note  Patient: Emily Davenport  Procedure(s) Performed: Procedure(s): RIGHT KNEE ARTHROSCOPY WITH DEBRIDEMENT/SHAVING (CHRONDRPLASTY), MEDIAL AND LATERAL MENISECTOMY (Right)  Patient Location: PACU  Anesthesia Type:General  Level of Consciousness: sedated  Airway & Oxygen Therapy: Patient Spontanous Breathing and Patient connected to face mask oxygen  Post-op Assessment: Report given to PACU RN and Post -op Vital signs reviewed and stable  Post vital signs: Reviewed and stable  Complications: No apparent anesthesia complications

## 2013-08-15 NOTE — Interval H&P Note (Signed)
History and Physical Interval Note:  08/15/2013 10:57 AM  Emily Davenport  has presented today for surgery, with the diagnosis of right knee:Chondro,alcia patella, tear knee cartiklage NOS  The various methods of treatment have been discussed with the patient and family. After consideration of risks, benefits and other options for treatment, the patient has consented to  Procedure(s): RIGHT KNEE ARTHROSCOPY WITH DEBRIDEMENT/SHAVING (CHRONDRPLASTY), MEDIAL AND LATERAL MENISECTOMY (Right) as a surgical intervention .  The patient's history has been reviewed, patient examined, no change in status, stable for surgery.  I have reviewed the patient's chart and labs.  Questions were answered to the patient's satisfaction.     Salvatore Marvel A

## 2013-08-16 ENCOUNTER — Encounter (HOSPITAL_BASED_OUTPATIENT_CLINIC_OR_DEPARTMENT_OTHER): Payer: Self-pay | Admitting: Orthopedic Surgery

## 2013-08-17 NOTE — Op Note (Signed)
NAMAngie Fava:  Davenport, Emily Davenport              ACCOUNT NO.:  [id number]  MEDICAL RECORD NO.:  [id number]  LOCATION:                                 FACILITY:  PHYSICIAN:  Misael Mcgaha A. Thurston HoleWainer, M.D. DATE OF BIRTH:  02-26-1960  DATE OF PROCEDURE:  08/15/2013 DATE OF DISCHARGE:  08/15/2013                              OPERATIVE REPORT   PREOPERATIVE DIAGNOSES: 1. Right knee lateral meniscus tear. 2. Right knee tricompartmental chondromalacia with synovitis.  POSTOPERATIVE DIAGNOSES: 1. Right knee lateral meniscus tear. 2. Right knee tricompartmental chondromalacia with synovitis.  PROCEDURE: 1. Right knee examination under anesthesia followed by arthroscopic     partial lateral meniscectomy. 2. Right knee chondroplasty with partial synovectomy.  SURGEON:  Elana Almobert A. Thurston HoleWainer, M.D.  ASSISTANT:  Kirstin Shepperson, PA-C.  ANESTHESIA:  General.  OPERATIVE TIME:  40 minutes.  COMPLICATIONS:  None.  INDICATION FOR PROCEDURE:  Ms. Emily Davenport is a 53 year old woman who has had significant right knee pain for the past 3-4 months increasing in nature with exam and MRI documenting meniscal tearing with chondromalacia.  She has failed multiple conservative modalities and is now to undergo arthroscopy.  DESCRIPTION OF PROCEDURE:  Ms. Emily Davenport was brought to the operating room on August 15, 2013, after a knee block was placed in the holding room by Anesthesia.  She was placed on the operating table in supine position.  She received antibiotics preoperatively for prophylaxis. After being placed under general anesthesia, her right knee was examined.  She had full range of motion.  Knee was stable.  Ligamentous exam with normal patellar tracking.  Right leg was prepped using sterile DuraPrep and draped using sterile technique.  Time-out procedure was called and the correct right knee identified.  Initially, through an anterolateral portal, the arthroscope with a pump attached was placed into an anteromedial  portal and arthroscopic probe was placed.  On initial inspection of medial compartment, she was found to have 50-60% grade 3 chondromalacia, which was debrided.  Medial meniscus was intact. Intercondylar notch inspected.  Anterior and posterior cruciate ligaments were normal.  Lateral compartment showed 75% grade 3 chondromalacia, which was debrided.  Lateral meniscus showed tearing of the posterior and lateral horn of which 40-50% was resected back to a stable rim.  Patellofemoral joint showed 50% grade 3 chondromalacia on the patellofemoral groove and this was debrided.  The patella tracked normally.  Moderate synovitis in the medial and gutters were debrided and a medial plica band was excised, otherwise this was free of Pathology.  After this done, it was felt that all pathology had been satisfactorily addressed.  The instruments were removed.  Portals was closed with 3-0 nylon suture.  Sterile dressings were applied and the patient awakened and taken to recovery room in stable condition.  FOLLOWUP CARE:  Ms. Emily Davenport will be followed as an outpatient on Norco for pain.  Seen back in the office in a week for sutures out follow up.     Emily Davenport A. Thurston HoleWainer, M.D.     RAW/MEDQ  D:  08/15/2013  T:  08/16/2013  Job:  [id number]

## 2013-08-18 ENCOUNTER — Ambulatory Visit: Payer: 59 | Admitting: Dietician

## 2013-08-29 ENCOUNTER — Telehealth: Payer: Self-pay | Admitting: *Deleted

## 2013-08-29 NOTE — Telephone Encounter (Signed)
Received fax from [facility] for refill on LUNESTA which look's like was discontinued at last visit  04/11/13

## 2013-08-29 NOTE — Telephone Encounter (Signed)
Emily Davenport  Call pt and at her last visit with me I recommended she see a psychiatrist for her anxiety and sleeping problems.    Ask pt if she  Saw one of the psychiatrists I recommended  - I would prefer that the psychiatrist take care of her sleeping issue

## 2013-08-30 NOTE — Telephone Encounter (Signed)
Spoke w/ pt & gave her instructions which she agreed with .Said she was seeing someone & would discuss refill w/ them .

## 2013-11-21 ENCOUNTER — Encounter (HOSPITAL_BASED_OUTPATIENT_CLINIC_OR_DEPARTMENT_OTHER): Payer: Self-pay | Admitting: Orthopedic Surgery

## 2013-12-19 ENCOUNTER — Other Ambulatory Visit: Payer: Self-pay | Admitting: *Deleted

## 2013-12-19 DIAGNOSIS — Z0000 Encounter for general adult medical examination without abnormal findings: Secondary | ICD-10-CM

## 2013-12-20 ENCOUNTER — Encounter: Payer: Self-pay | Admitting: *Deleted

## 2013-12-20 ENCOUNTER — Encounter: Payer: Self-pay | Admitting: Internal Medicine

## 2013-12-20 ENCOUNTER — Ambulatory Visit (INDEPENDENT_AMBULATORY_CARE_PROVIDER_SITE_OTHER): Payer: 59 | Admitting: Internal Medicine

## 2013-12-20 VITALS — BP 110/82 | HR 92 | Temp 97.9°F | Resp 16 | Ht 63.75 in | Wt 218.0 lb

## 2013-12-20 DIAGNOSIS — R3129 Other microscopic hematuria: Secondary | ICD-10-CM

## 2013-12-20 DIAGNOSIS — R748 Abnormal levels of other serum enzymes: Secondary | ICD-10-CM

## 2013-12-20 DIAGNOSIS — Z0000 Encounter for general adult medical examination without abnormal findings: Secondary | ICD-10-CM

## 2013-12-20 DIAGNOSIS — N281 Cyst of kidney, acquired: Secondary | ICD-10-CM

## 2013-12-20 DIAGNOSIS — R7989 Other specified abnormal findings of blood chemistry: Secondary | ICD-10-CM

## 2013-12-20 DIAGNOSIS — Z0189 Encounter for other specified special examinations: Secondary | ICD-10-CM

## 2013-12-20 DIAGNOSIS — N289 Disorder of kidney and ureter, unspecified: Secondary | ICD-10-CM

## 2013-12-20 DIAGNOSIS — Q6102 Congenital multiple renal cysts: Secondary | ICD-10-CM

## 2013-12-20 DIAGNOSIS — R312 Other microscopic hematuria: Secondary | ICD-10-CM

## 2013-12-20 HISTORY — DX: Disorder of kidney and ureter, unspecified: N28.9

## 2013-12-20 LAB — COMPLETE METABOLIC PANEL WITH GFR
ALBUMIN: 4 g/dL (ref 3.5–5.2)
ALT: 74 U/L — ABNORMAL HIGH (ref 0–35)
AST: 37 U/L (ref 0–37)
Alkaline Phosphatase: 119 U/L — ABNORMAL HIGH (ref 39–117)
BUN: 19 mg/dL (ref 6–23)
CO2: 26 mEq/L (ref 19–32)
Calcium: 9.3 mg/dL (ref 8.4–10.5)
Chloride: 104 mEq/L (ref 96–112)
Creat: 1.05 mg/dL (ref 0.50–1.10)
GFR, Est African American: 70 mL/min
GFR, Est Non African American: 61 mL/min
Glucose, Bld: 109 mg/dL — ABNORMAL HIGH (ref 70–99)
POTASSIUM: 4.3 meq/L (ref 3.5–5.3)
Sodium: 140 mEq/L (ref 135–145)
Total Bilirubin: 0.5 mg/dL (ref 0.2–1.2)
Total Protein: 7 g/dL (ref 6.0–8.3)

## 2013-12-20 LAB — LIPID PANEL
CHOL/HDL RATIO: 4.5 ratio
CHOLESTEROL: 246 mg/dL — AB (ref 0–200)
HDL: 55 mg/dL (ref >39)
LDL Cholesterol: 156 mg/dL — ABNORMAL HIGH (ref 0–99)
Triglycerides: 176 mg/dL — ABNORMAL HIGH (ref <150)
VLDL: 35 mg/dL (ref 0–40)

## 2013-12-20 LAB — CBC WITH DIFFERENTIAL/PLATELET
BASOS ABS: 0 10*3/uL (ref 0.0–0.1)
BASOS PCT: 0 % (ref 0–1)
Eosinophils Absolute: 0.1 10*3/uL (ref 0.0–0.7)
Eosinophils Relative: 1 % (ref 0–5)
HCT: 40.3 % (ref 36.0–46.0)
Hemoglobin: 13.8 g/dL (ref 12.0–15.0)
Lymphocytes Relative: 23 % (ref 12–46)
Lymphs Abs: 2.2 10*3/uL (ref 0.7–4.0)
MCH: 28.1 pg (ref 26.0–34.0)
MCHC: 34.2 g/dL (ref 30.0–36.0)
MCV: 82.1 fL (ref 78.0–100.0)
MONO ABS: 0.7 10*3/uL (ref 0.1–1.0)
MPV: 9.8 fL (ref 9.4–12.4)
Monocytes Relative: 7 % (ref 3–12)
NEUTROS ABS: 6.5 10*3/uL (ref 1.7–7.7)
NEUTROS PCT: 69 % (ref 43–77)
Platelets: 320 10*3/uL (ref 150–400)
RBC: 4.91 MIL/uL (ref 3.87–5.11)
RDW: 14.4 % (ref 11.5–15.5)
WBC: 9.4 10*3/uL (ref 4.0–10.5)

## 2013-12-20 LAB — POCT URINALYSIS DIPSTICK
Bilirubin, UA: NEGATIVE
Blood, UA: NEGATIVE
GLUCOSE UA: NEGATIVE
Ketones, UA: NEGATIVE
Leukocytes, UA: NEGATIVE
NITRITE UA: NEGATIVE
Protein, UA: NEGATIVE
Spec Grav, UA: 1.01
Urobilinogen, UA: NEGATIVE
pH, UA: 6.5

## 2013-12-20 LAB — EKG 12-LEAD

## 2013-12-20 LAB — TSH: TSH: 0.702 u[IU]/mL (ref 0.350–4.500)

## 2013-12-20 NOTE — Progress Notes (Signed)
Subjective:    Patient ID: Emily Davenport, female    DOB: 12-24-1960, 53 y.o.   MRN: [id number]  HPI 03/2013 note Assessment & Plan:  Anxiety / Probable OCD/ Probable hypochondriasis or other psychiatric disorder:  She compulsively uses Internet to search for medical illnesses and I do not think Ativan is controlling this very well. She has been on Remeron in the past. Will need old records.   Renal calculus left kidney  Multiple sclerosis See Brain MRI Initially diagnosed in 2002 but most recent MRI's do not show any progression. She sees Dr. Lin Givens  ?? Hepatomegaly I see no mention of hepatomegaly on most recent CT and U/S (01/2013) I told pt I see no clinical evidence that she has amyloidosis. She  IBS  GERD  See me in 2-3 weeks   Today   Emily Davenport is here for CPE: HM:  S/P hysterectomy , mmdue in December,  She is a non-smoker  UTD with vaccines.   Since last visit she has seen her GI MD who is evaluating her for intermittant elevation of lfts felt to be secondary to fatty liver.  See scanned note.  Extensive imaging and biochemical profile all negative  She has also seen nephrologist for hematuria,  Renal calculs.  See scanned note  Overall doing well  Still quite anxious and worries incessantly about amyloidosis and if she is going to die.  She tells me this is better though.  She    Review of Systems  Respiratory: Negative for cough, choking, chest tightness, shortness of breath and wheezing.   Cardiovascular: Negative for chest pain, palpitations and leg swelling.  All other systems reviewed and are negative.     Objective:   Physical Exam Physical Exam  Vital signs and nursing note reviewed  Constitutional: She is oriented to person, place, and time. She appears well-developed and well-nourished. She is cooperative.  HENT:  Head: Normocephalic and atraumatic.  Right Ear: Tympanic membrane normal.  Left Ear: Tympanic membrane normal.  Nose:  Nose normal.  Mouth/Throat: Oropharynx is clear and moist and mucous membranes are normal. No oropharyngeal exudate or posterior oropharyngeal erythema.  Eyes: Conjunctivae and EOM are normal. Pupils are equal, round, and reactive to light.  Neck: Neck supple. No JVD present. Carotid bruit is not present. No mass and no thyromegaly present.  Cardiovascular: Regular rhythm, normal heart sounds, intact distal pulses and normal pulses.  Exam reveals no gallop and no friction rub.   No murmur heard. Pulses:      Dorsalis pedis pulses are 2+ on the right side, and 2+ on the left side.  Pulmonary/Chest: Breath sounds normal. She has no wheezes. She has no rhonchi. She has no rales. Right breast exhibits no mass, no nipple discharge and no skin change. Left breast exhibits no mass, no nipple discharge and no skin change.  Abdominal: Soft. Bowel sounds are normal. She exhibits no distension and no mass. There is no hepatosplenomegaly. There is no tenderness. There is no CVA tenderness.  Genitourinary: Rectum normal, vagina normal and uterus normal. Rectal exam shows no mass. Guaiac negative stool. No labial fusion. There is no lesion on the right labia. There is no lesion on the left labia. Cervix exhibits no motion tenderness. Right adnexum displays no mass, no tenderness and no fullness. Left adnexum displays no mass, no tenderness and no fullness. No erythema around the vagina.  Rectal no mass guaiac neg Musculoskeletal:       No active synovitis  to any joint.    Lymphadenopathy:       Right cervical: No superficial cervical adenopathy present.      Left cervical: No superficial cervical adenopathy present.       Right axillary: No pectoral and no lateral adenopathy present.       Left axillary: No pectoral and no lateral adenopathy present.      Right: No inguinal adenopathy present.       Left: No inguinal adenopathy present.  Neurological: She is alert and oriented to person, place, and time. She  has normal strength and normal reflexes. No cranial nerve deficit or sensory deficit. She displays a negative Romberg sign. Coordination and gait normal.  Skin: Skin is warm and dry. No abrasion, no bruising, no ecchymosis and no rash noted. No cyanosis. Nails show no clubbing.  Psychiatric: She has a normal mood and affect. Her speech is normal and behavior is normal.          Assessment & Plan:   HM:  Advised 3D mm in December,  UTD with vaccines. She is a non-smoker  Elevated lfts  Felt due to fatty liver  Followed by GI>  Microscopic hematuria/renal calculi/renal cyst  Followed by nephrology  Anxiety  Pt will make appt with new psychiatrist  Continue Klonopin   Hyperlipidemia re chec today  GERD continue meds    See me 6 months or sooner prn        Assessment & Plan:

## 2013-12-20 NOTE — Patient Instructions (Signed)
See me in 6 months or prn 

## 2013-12-21 LAB — VITAMIN D 25 HYDROXY (VIT D DEFICIENCY, FRACTURES): VIT D 25 HYDROXY: 16 ng/mL — AB (ref 30–100)

## 2013-12-22 ENCOUNTER — Encounter: Payer: Self-pay | Admitting: Internal Medicine

## 2013-12-22 NOTE — Progress Notes (Signed)
Spoke with Emily Davenport about her lab results and made her a follow up appointment for January. I also mailed her a copy of the labs-eh

## 2013-12-24 ENCOUNTER — Encounter: Payer: Self-pay | Admitting: Internal Medicine

## 2013-12-28 ENCOUNTER — Telehealth: Payer: Self-pay | Admitting: Internal Medicine

## 2013-12-28 NOTE — Telephone Encounter (Signed)
I spoke with Emily Davenport and let her know about her lab results. I also let her know that her labs did not suggest amyloidosis. I also mailed her a copy of the DASH diet-eh

## 2013-12-28 NOTE — Telephone Encounter (Signed)
I want to discuss her high cholesterol with her.  No hurry . She can see me at her convenience    Give copy of DASH diet and have her follow    No signs of any labs to suggest amyloidosis

## 2014-01-05 ENCOUNTER — Encounter: Payer: Self-pay | Admitting: *Deleted

## 2014-01-20 LAB — HM MAMMOGRAPHY: HM Mammogram: NORMAL (ref 0–4)

## 2014-01-31 ENCOUNTER — Encounter: Payer: Self-pay | Admitting: *Deleted

## 2014-02-05 NOTE — Progress Notes (Addendum)
Subjective:    Patient ID: Emily Davenport, female    DOB: 01-11-1961, 54 y.o.   MRN: [id number]  HPI  12/2013 visit HM: Advised 3D mm in December, UTD with vaccines. She is a non-smoker  Elevated lfts Felt due to fatty liver Followed by GI>  Microscopic hematuria/renal calculi/renal cyst Followed by nephrology  Anxiety Pt will make appt with new psychiatrist Continue Klonopin   Hyperlipidemia re chec today  GERD continue meds   See me 6 months or sooner prn   TODAY:  Emily Davenport returns for follow up.  She has started with a new therapist  Emily Davenport.    See scanned report from GI MD  He feels elevated lfts likely due to fatty liver.   Last report 1 month ago lfts improving.  He will see her again in April and if lfts still elevated will discuss liver biopsy.  Pt tells me she is still worried about amyloidosis.     She is back to compulsively searching the internet  And tells me she does not trust anyone    Severe anxiety/OCD:  I gave her the number to a new psychiatrist but pt states  " I am playing phone tag"   She reports  "difficult to close my mind down"    See lipids     See vitamin D  She does not want a prescribed dose for this    Allergies  Allergen Reactions  . Augmentin [Amoxicillin-Pot Clavulanate]     diarrhea   Past Medical History  Diagnosis Date  . Insomnia   . Anxiety   . Multiple sclerosis   . GERD (gastroesophageal reflux disease)   . Hyperlipidemia   . Depression   . Tear of lateral meniscus of right knee    Past Surgical History  Procedure Laterality Date  . Bladder surgery  2009    sling  . Nasal sinus surgery  2000  . Rhinoplasty  1979  . Planterfaciitis  1998    right  . Vaginal hysterectomy  2010  . Colonoscopy    . Upper gi endoscopy    . Knee arthroscopy with medial menisectomy Right 08/15/2013    Procedure: RIGHT KNEE ARTHROSCOPY WITH DEBRIDEMENT/SHAVING (CHRONDRPLASTY), MEDIAL AND LATERAL MENISECTOMY;  Surgeon: Nilda Simmer, MD;  Location: [facility];  Service: Orthopedics;  Laterality: Right;   History   Social History  . Marital Status: Married    Spouse Name: N/A    Number of Children: N/A  . Years of Education: N/A   Occupational History  . Not on file.   Social History Main Topics  . Smoking status: Never Smoker   . Smokeless tobacco: Never Used  . Alcohol Use: Yes     Comment: ocasionally  . Drug Use: No  . Sexual Activity: Yes   Other Topics Concern  . Not on file   Social History Narrative   Family History  Problem Relation Age of Onset  . Kidney disease Mother   . Osteoporosis Mother   . Hypertension Father   . Cancer Father     throat  . Hypertension Sister    Patient Active Problem List   Diagnosis Date Noted  . Elevated serum creatinine 12/20/2013  . Bilateral renal cysts 12/20/2013  . Microscopic hematuria 12/20/2013  . Tear of lateral meniscus of right knee   . Insomnia 04/11/2013  . Anxiety 03/28/2013  . GERD (gastroesophageal reflux disease) 03/28/2013  . Renal calculi 03/28/2013  .  Hyperlipidemia 03/28/2013  . IBS (irritable bowel syndrome) 03/28/2013  . Endometriosis 03/28/2013  . S/P total hysterectomy and BSO (bilateral salpingo-oophorectomy) 03/28/2013  . Multiple sclerosis 03/28/2013  . Diverticulosis 03/28/2013  . Right ankle pain 06/14/2010   Current Outpatient Prescriptions on File Prior to Visit  Medication Sig Dispense Refill  . clonazePAM (KLONOPIN) 1 MG tablet Take 1 tablet (1 mg total) by mouth 2 (two) times daily. 60 tablet 0  . cloNIDine (CATAPRES) 0.1 MG tablet Take one tablet twice a day 60 tablet 1  . Cyanocobalamin (VITAMIN B 12 PO) Take 1,000 mg by mouth every morning.    Marland Kitchen doxepin (SINEQUAN) 75 MG capsule Take 75 mg by mouth at bedtime.     Marland Kitchen FERROUS GLUCONATE PO Take by mouth every morning.    . Multiple Vitamins-Minerals (MULTIVITAMIN & MINERAL PO) Take by mouth.    . pyridOXINE (VITAMIN B-6) 100 MG tablet Take 100  mg by mouth daily.    Marland Kitchen VIVELLE-DOT 0.1 MG/24HR patch      No current facility-administered medications on file prior to visit.      Review of Systems See HPI    Objective:   Physical Exam  Physical Exam  Nursing note and vitals reviewed.  Constitutional: She is oriented to person, place, and time. She appears well-developed and well-nourished.  HENT:  Head: Normocephalic and atraumatic.  Cardiovascular: Normal rate and regular rhythm. Exam reveals no gallop and no friction rub.  No murmur heard.  Pulmonary/Chest: Breath sounds normal. She has no wheezes. She has no rales.  Neurological: She is alert and oriented to person, place, and time.  Skin: Skin is warm and dry.  Psychiatric: She has a normal mood and affect. Her behavior is normal.       Assessment & Plan:  Hyperlipdemia  DASH diet given  Statin relatively CI due to abnormal lfts  Elevated lfts  Being evaluated by Dr. Loletta Specter  GI  See scanned note of 1/12  Severe anxiety/OCD/   I again advised that she see a new psychiatrist as I do think her psychiatric symptoms are not controlled    See me in 3 months  She declines lab tests today .   I encouraged her to be sure to follow with GI MD.   Addendum:  Pt went to see Dr Ewing Schlein,  This is her 3rd different GI MD  See his scanned note of 04/17/2014

## 2014-02-06 ENCOUNTER — Encounter: Payer: Self-pay | Admitting: Internal Medicine

## 2014-02-06 ENCOUNTER — Ambulatory Visit (INDEPENDENT_AMBULATORY_CARE_PROVIDER_SITE_OTHER): Payer: 59 | Admitting: Internal Medicine

## 2014-02-06 VITALS — BP 118/79 | HR 96 | Resp 16 | Ht 63.75 in | Wt 210.0 lb

## 2014-02-06 DIAGNOSIS — R7989 Other specified abnormal findings of blood chemistry: Secondary | ICD-10-CM

## 2014-02-06 DIAGNOSIS — R945 Abnormal results of liver function studies: Secondary | ICD-10-CM

## 2014-02-06 DIAGNOSIS — F419 Anxiety disorder, unspecified: Secondary | ICD-10-CM

## 2014-02-06 DIAGNOSIS — E785 Hyperlipidemia, unspecified: Secondary | ICD-10-CM

## 2014-02-09 ENCOUNTER — Encounter: Payer: Self-pay | Admitting: Internal Medicine

## 2014-04-17 ENCOUNTER — Other Ambulatory Visit: Payer: Self-pay | Admitting: Gastroenterology

## 2014-04-17 DIAGNOSIS — R748 Abnormal levels of other serum enzymes: Secondary | ICD-10-CM

## 2014-04-21 ENCOUNTER — Other Ambulatory Visit: Payer: Self-pay | Admitting: Gastroenterology

## 2014-04-21 ENCOUNTER — Ambulatory Visit
Admission: RE | Admit: 2014-04-21 | Discharge: 2014-04-21 | Disposition: A | Discharge status: Home / Self Care | Payer: 59 | Source: Ambulatory Visit | Attending: Gastroenterology | Admitting: Gastroenterology

## 2014-04-21 DIAGNOSIS — R748 Abnormal levels of other serum enzymes: Secondary | ICD-10-CM

## 2014-05-09 ENCOUNTER — Encounter: Payer: Self-pay | Admitting: Internal Medicine

## 2014-05-09 ENCOUNTER — Encounter: Payer: Self-pay | Admitting: *Deleted

## 2014-05-09 ENCOUNTER — Ambulatory Visit (INDEPENDENT_AMBULATORY_CARE_PROVIDER_SITE_OTHER): Payer: 59 | Admitting: Internal Medicine

## 2014-05-09 VITALS — BP 115/78 | HR 86 | Resp 16 | Ht 63.75 in | Wt 196.0 lb

## 2014-05-09 DIAGNOSIS — R7989 Other specified abnormal findings of blood chemistry: Secondary | ICD-10-CM

## 2014-05-09 DIAGNOSIS — R945 Abnormal results of liver function studies: Principal | ICD-10-CM

## 2014-05-09 NOTE — Progress Notes (Signed)
Subjective:    Patient ID: Emily Davenport, female    DOB: 11-Aug-1960, 54 y.o.   MRN: [id number]  HPI  02/06/2014 note Assessment & Plan:  Hyperlipdemia DASH diet given Statin relatively CI due to abnormal lfts  Elevated lfts Being evaluated by Dr. Loletta Specter GI See scanned note of 1/12  Severe anxiety/OCD/ I again advised that she see a new psychiatrist as I do think her psychiatric symptoms are not controlled   See me in 3 months She declines lab tests today . I encouraged her to be sure to follow with GI MD.   Addendum:  Pt went to see Dr Ewing Schlein, This is her 3rd different GI MD See his scanned note of 04/17/2014         Revision History        TODAY  Emily Davenport is here for follow up.  Since last visit she has seen    She has seen  Dr Ewing Schlein 3/28  (Her third GI MD)   See scanned notes.  Emily Davenport tells me she is still worried over amyloidosis.  I note the Dr. Chales Abrahams has even offered liver BX but pt declined back in January.  Dr. Ewing Schlein has suggested a rectal biopsy at her next colonoscopy   Other than her concern for amyloid Emily Davenport has no complaints   Allergies  Allergen Reactions  . Augmentin [Amoxicillin-Pot Clavulanate]     diarrhea   Past Medical History  Diagnosis Date  . Insomnia   . Anxiety   . Multiple sclerosis   . GERD (gastroesophageal reflux disease)   . Hyperlipidemia   . Depression   . Tear of lateral meniscus of right knee    Past Surgical History  Procedure Laterality Date  . Bladder surgery  2009    sling  . Nasal sinus surgery  2000  . Rhinoplasty  1979  . Planterfaciitis  1998    right  . Vaginal hysterectomy  2010  . Colonoscopy    . Upper gi endoscopy    . Knee arthroscopy with medial menisectomy Right 08/15/2013    Procedure: RIGHT KNEE ARTHROSCOPY WITH DEBRIDEMENT/SHAVING (CHRONDRPLASTY), MEDIAL AND LATERAL MENISECTOMY;  Surgeon: Nilda Simmer, MD;  Location: [facility];  Service: Orthopedics;  Laterality:  Right;   History   Social History  . Marital Status: Married    Spouse Name: N/A  . Number of Children: N/A  . Years of Education: N/A   Occupational History  . Not on file.   Social History Main Topics  . Smoking status: Never Smoker   . Smokeless tobacco: Never Used  . Alcohol Use: Yes     Comment: ocasionally  . Drug Use: No  . Sexual Activity: Yes   Other Topics Concern  . Not on file   Social History Narrative   Family History  Problem Relation Age of Onset  . Kidney disease Mother   . Osteoporosis Mother   . Hypertension Father   . Cancer Father     throat  . Hypertension Sister    Patient Active Problem List   Diagnosis Date Noted  . Elevated LFTs 02/06/2014  . Elevated serum creatinine 12/20/2013  . Bilateral renal cysts 12/20/2013  . Microscopic hematuria 12/20/2013  . Tear of lateral meniscus of right knee   . Insomnia 04/11/2013  . Anxiety 03/28/2013  . GERD (gastroesophageal reflux disease) 03/28/2013  . Renal calculi 03/28/2013  . Hyperlipidemia 03/28/2013  . IBS (irritable bowel syndrome) 03/28/2013  .  Endometriosis 03/28/2013  . S/P total hysterectomy and BSO (bilateral salpingo-oophorectomy) 03/28/2013  . Multiple sclerosis 03/28/2013  . Diverticulosis 03/28/2013  . Right ankle pain 06/14/2010   Current Outpatient Prescriptions on File Prior to Visit  Medication Sig Dispense Refill  . clonazePAM (KLONOPIN) 1 MG tablet Take 1 tablet (1 mg total) by mouth 2 (two) times daily. 60 tablet 0  . cloNIDine (CATAPRES) 0.1 MG tablet Take one tablet twice a day 60 tablet 1  . Eszopiclone 3 MG TABS Take 3 mg by mouth at bedtime. Take immediately before bedtime    . FERROUS GLUCONATE PO Take by mouth every morning.    . Multiple Vitamins-Minerals (MULTIVITAMIN & MINERAL PO) Take by mouth.    Marland Kitchen VIVELLE-DOT 0.1 MG/24HR patch      No current facility-administered medications on file prior to visit.      Review of Systems See HPI    Objective:    Physical Exam Physical Exam  Nursing note and vitals reviewed.  Affect flat Constitutional: She is oriented to person, place, and time. She appears well-developed and well-nourished.  HENT:  Head: Normocephalic and atraumatic.  Cardiovascular: Normal rate and regular rhythm. Exam reveals no gallop and no friction rub.  No murmur heard.  Pulmonary/Chest: Breath sounds normal. She has no wheezes. She has no rales.  Neurological: She is alert and oriented to person, place, and time.  Skin: Skin is warm and dry.  Psychiatric: She has a normal mood and affect. Her behavior is normal.       Assessment & Plan:  Elevated lfts  Extensive work up with multiple GI MDs.  pts continued concern is over amyloidosis.   I stressed to pt that the gold standard for diagnosis of amyloidosis is a biopsy.  Dr. Chales Abrahams has offered this and she is advised to see him about this option.  Will check hepatic panel today   Anxiety /OCD  She is seeing her therapist today. I have offered referral to psychiatrist but pt declines  Hyperlipidemia  She delcines any RX meds.  May be relatively CI due to transaminase elevation  Renal calculi  Pt has received letter regarding my departure and advised to make appt  With new PCP  She voices understanding

## 2014-05-13 LAB — COMPREHENSIVE METABOLIC PANEL
ALT: 45 U/L — ABNORMAL HIGH (ref 0–35)
AST: 28 U/L (ref 0–37)
Albumin: 4 g/dL (ref 3.5–5.2)
Alkaline Phosphatase: 103 U/L (ref 39–117)
BUN: 14 mg/dL (ref 6–23)
CALCIUM: 9.8 mg/dL (ref 8.4–10.5)
CO2: 26 mEq/L (ref 19–32)
CREATININE: 0.84 mg/dL (ref 0.50–1.10)
Chloride: 102 mEq/L (ref 96–112)
Glucose, Bld: 91 mg/dL (ref 70–99)
POTASSIUM: 4.3 meq/L (ref 3.5–5.3)
Sodium: 139 mEq/L (ref 135–145)
Total Bilirubin: 0.5 mg/dL (ref 0.2–1.2)
Total Protein: 7.1 g/dL (ref 6.0–8.3)

## 2014-05-15 ENCOUNTER — Encounter: Payer: Self-pay | Admitting: *Deleted

## 2014-05-15 ENCOUNTER — Telehealth: Payer: Self-pay | Admitting: *Deleted

## 2014-05-15 NOTE — Telephone Encounter (Signed)
-----   Message from Kendrick Ranch, MD sent at 05/15/2014  7:18 AM EDT ----- Labs on your desk to mail   Please fax copy to Dr. Vida Rigger  GI  And Dr. Lynann Bologna of [facility]  thanks

## 2014-05-15 NOTE — Telephone Encounter (Signed)
Emily Davenport is aware of her lab results.

## 2014-05-22 ENCOUNTER — Encounter: Payer: Self-pay | Admitting: *Deleted

## 2014-06-21 ENCOUNTER — Ambulatory Visit: Payer: 59 | Admitting: Internal Medicine

## 2014-06-28 ENCOUNTER — Encounter: Payer: Self-pay | Admitting: *Deleted

## 2015-05-10 ENCOUNTER — Encounter: Payer: Self-pay | Admitting: Behavioral Health

## 2015-05-10 ENCOUNTER — Telehealth: Payer: Self-pay | Admitting: Behavioral Health

## 2015-05-10 NOTE — Telephone Encounter (Signed)
Pre-Visit Call completed with patient and chart updated.   Pre-Visit Info documented in Specialty Comments under [employer].    

## 2015-05-11 ENCOUNTER — Ambulatory Visit (INDEPENDENT_AMBULATORY_CARE_PROVIDER_SITE_OTHER): Payer: 59 | Admitting: Family Medicine

## 2015-05-11 ENCOUNTER — Encounter: Payer: Self-pay | Admitting: Family Medicine

## 2015-05-11 VITALS — BP 100/68 | HR 86 | Temp 97.9°F | Ht 64.0 in | Wt 188.0 lb

## 2015-05-11 DIAGNOSIS — E785 Hyperlipidemia, unspecified: Secondary | ICD-10-CM

## 2015-05-11 DIAGNOSIS — K589 Irritable bowel syndrome without diarrhea: Secondary | ICD-10-CM

## 2015-05-11 DIAGNOSIS — K529 Noninfective gastroenteritis and colitis, unspecified: Secondary | ICD-10-CM | POA: Insufficient documentation

## 2015-05-11 DIAGNOSIS — Z8709 Personal history of other diseases of the respiratory system: Secondary | ICD-10-CM

## 2015-05-11 DIAGNOSIS — Z0000 Encounter for general adult medical examination without abnormal findings: Secondary | ICD-10-CM

## 2015-05-11 DIAGNOSIS — Z8742 Personal history of other diseases of the female genital tract: Secondary | ICD-10-CM

## 2015-05-11 DIAGNOSIS — R945 Abnormal results of liver function studies: Secondary | ICD-10-CM

## 2015-05-11 DIAGNOSIS — G35 Multiple sclerosis: Secondary | ICD-10-CM

## 2015-05-11 DIAGNOSIS — G4700 Insomnia, unspecified: Secondary | ICD-10-CM

## 2015-05-11 DIAGNOSIS — J302 Other seasonal allergic rhinitis: Secondary | ICD-10-CM

## 2015-05-11 DIAGNOSIS — S83281S Other tear of lateral meniscus, current injury, right knee, sequela: Secondary | ICD-10-CM

## 2015-05-11 DIAGNOSIS — K219 Gastro-esophageal reflux disease without esophagitis: Secondary | ICD-10-CM | POA: Diagnosis not present

## 2015-05-11 DIAGNOSIS — R748 Abnormal levels of other serum enzymes: Secondary | ICD-10-CM

## 2015-05-11 DIAGNOSIS — R7989 Other specified abnormal findings of blood chemistry: Secondary | ICD-10-CM

## 2015-05-11 DIAGNOSIS — Z8619 Personal history of other infectious and parasitic diseases: Secondary | ICD-10-CM

## 2015-05-11 HISTORY — DX: Personal history of other diseases of the respiratory system: Z87.09

## 2015-05-11 HISTORY — DX: Personal history of other infectious and parasitic diseases: Z86.19

## 2015-05-11 HISTORY — DX: Other seasonal allergic rhinitis: J30.2

## 2015-05-11 MED ORDER — ESZOPICLONE 3 MG PO TABS: 3.0000 mg | ORAL_TABLET | Freq: Every day | ORAL | Status: DC

## 2015-05-11 MED ORDER — LORAZEPAM 1 MG PO TABS: 1.0000 mg | ORAL_TABLET | ORAL | Status: DC | PRN

## 2015-05-11 NOTE — Progress Notes (Signed)
Pre visit review using our clinic review tool, if applicable. No additional management support is needed unless otherwise documented below in the visit note. 

## 2015-05-11 NOTE — Patient Instructions (Addendum)
BRAT diet , Krill Oil NOW company at [facility], NOW Probiotic Vitamin available at [employer].   Food Choices to Help Relieve Diarrhea, Adult When you have diarrhea, the foods you eat and your eating habits are very important. Choosing the right foods and drinks can help relieve diarrhea. Also, because diarrhea can last up to 7 days, you need to replace lost fluids and electrolytes (such as sodium, potassium, and chloride) in order to help prevent dehydration.  WHAT GENERAL GUIDELINES DO I NEED TO FOLLOW?  Slowly drink 1 cup (8 oz) of fluid for each episode of diarrhea. If you are getting enough fluid, your urine will be clear or pale yellow.  Eat starchy foods. Some good choices include white rice, white toast, pasta, low-fiber cereal, baked potatoes (without the skin), saltine crackers, and bagels.  Avoid large servings of any cooked vegetables.  Limit fruit to two servings per day. A serving is  cup or 1 small piece.  Choose foods with less than 2 g of fiber per serving.  Limit fats to less than 8 tsp (38 g) per day.  Avoid fried foods.  Eat foods that have probiotics in them. Probiotics can be found in certain dairy products.  Avoid foods and beverages that may increase the speed at which food moves through the stomach and intestines (gastrointestinal tract). Things to avoid include:  High-fiber foods, such as dried fruit, raw fruits and vegetables, nuts, seeds, and whole grain foods.  Spicy foods and high-fat foods.  Foods and beverages sweetened with high-fructose corn syrup, honey, or sugar alcohols such as xylitol, sorbitol, and mannitol. WHAT FOODS ARE RECOMMENDED? Grains White rice. White, [place], or pita breads (fresh or toasted), including plain rolls, buns, or bagels. White pasta. Saltine, soda, or graham crackers. Pretzels. Low-fiber cereal. Cooked cereals made with water (such as cornmeal, farina, or cream cereals). Plain muffins. Matzo. Melba toast.  Zwieback.  Vegetables Potatoes (without the skin). Strained tomato and vegetable juices. Most well-cooked and canned vegetables without seeds. Tender lettuce. Fruits Cooked or canned applesauce, apricots, cherries, fruit cocktail, grapefruit, peaches, pears, or plums. Fresh bananas, apples without skin, cherries, grapes, cantaloupe, grapefruit, peaches, oranges, or plums.  Meat and Other Protein Products Baked or boiled chicken. Eggs. Tofu. Fish. Seafood. Smooth peanut butter. Ground or well-cooked tender beef, ham, veal, lamb, pork, or poultry.  Dairy Plain yogurt, kefir, and unsweetened liquid yogurt. Lactose-free milk, buttermilk, or soy milk. Plain hard cheese. Beverages Sport drinks. Clear broths. Diluted fruit juices (except prune). Regular, caffeine-free sodas such as ginger ale. Water. Decaffeinated teas. Oral rehydration solutions. Sugar-free beverages not sweetened with sugar alcohols. Other Bouillon, broth, or soups made from recommended foods.  The items listed above may not be a complete list of recommended foods or beverages. Contact your dietitian for more options. WHAT FOODS ARE NOT RECOMMENDED? Grains Whole grain, whole wheat, bran, or rye breads, rolls, pastas, crackers, and cereals. Wild or brown rice. Cereals that contain more than 2 g of fiber per serving. Corn tortillas or taco shells. Cooked or dry oatmeal. Granola. Popcorn. Vegetables Raw vegetables. Cabbage, broccoli, Brussels sprouts, artichokes, baked beans, beet greens, corn, kale, legumes, peas, sweet potatoes, and yams. Potato skins. Cooked spinach and cabbage. Fruits Dried fruit, including raisins and dates. Raw fruits. Stewed or dried prunes. Fresh apples with skin, apricots, mangoes, pears, raspberries, and strawberries.  Meat and Other Protein Products Chunky peanut butter. Nuts and seeds. Beans and lentils. Tomasa Blase.  Dairy High-fat cheeses. Milk, chocolate milk, and beverages made  with milk, such as milk  shakes. Cream. Ice cream. Sweets and Desserts Sweet rolls, doughnuts, and sweet breads. Pancakes and waffles. Fats and Oils Butter. Cream sauces. Margarine. Salad oils. Plain salad dressings. Olives. Avocados.  Beverages Caffeinated beverages (such as coffee, tea, soda, or energy drinks). Alcoholic beverages. Fruit juices with pulp. Prune juice. Soft drinks sweetened with high-fructose corn syrup or sugar alcohols. Other Coconut. Hot sauce. Chili powder. Mayonnaise. Gravy. Cream-based or milk-based soups.  The items listed above may not be a complete list of foods and beverages to avoid. Contact your dietitian for more information. WHAT SHOULD I DO IF I BECOME DEHYDRATED? Diarrhea can sometimes lead to dehydration. Signs of dehydration include dark urine and dry mouth and skin. If you think you are dehydrated, you should rehydrate with an oral rehydration solution. These solutions can be purchased at pharmacies, retail stores, or online.  Drink -1 cup (120-240 mL) of oral rehydration solution each time you have an episode of diarrhea. If drinking this amount makes your diarrhea worse, try drinking smaller amounts more often. For example, drink 1-3 tsp (5-15 mL) every 5-10 minutes.  A general rule for staying hydrated is to drink 1-2 L of fluid per day. Talk to your health care provider about the specific amount you should be drinking each day. Drink enough fluids to keep your urine clear or pale yellow.   This information is not intended to replace advice given to you by your health care provider. Make sure you discuss any questions you have with your health care provider.   Document Released: 03/29/2003 Document Revised: 01/27/2014 Document Reviewed: 11/29/2012 Elsevier Interactive Patient Education [employer]

## 2015-05-11 NOTE — Assessment & Plan Note (Signed)
Triggered by certain foods, start probiotics

## 2015-05-11 NOTE — Assessment & Plan Note (Signed)
Has seen Dr Chales Abrahams at [facility] and second opinion with Dr Ewing Schlein

## 2015-05-11 NOTE — Assessment & Plan Note (Addendum)
Occasional symptoms, controlled with good diet. Avoid offending foods

## 2015-05-11 NOTE — Progress Notes (Signed)
Subjective:    Patient ID: Emily Davenport, female    DOB: 03/23/1960, 55 y.o.   MRN: [id number]  Chief Complaint  Patient presents with  . Establish Care    HPI Patient is in today to establish care as a new patient. {MH consists of hi cholesterol. Reflux, hematuria, allergies, anxiety, kidney disease and elevated LFTs. Patient has some occasional heartburn and history of endometrius, but has no present symptoms going on now.  Patient does have some current insomnia, struggles more with staying asleep. Denies CP/palp/SOB/HA/congestion/fevers/GU c/o. Taking meds as prescribed   Past Medical History  Diagnosis Date  . Insomnia   . Anxiety   . Multiple sclerosis (HCC)   . GERD (gastroesophageal reflux disease)   . Hyperlipidemia   . Depression   . Tear of lateral meniscus of right knee   . Hx of endometriosis 03/28/2013  . History of chicken pox 05/11/2015  . H/O sinusitis 05/11/2015  . History of chicken pox 05/11/2015  . Seasonal allergies 05/11/2015    Past Surgical History  Procedure Laterality Date  . Bladder surgery  2009    sling  . Nasal sinus surgery  2000  . Rhinoplasty  1979  . Planterfaciitis  1998    right  . Vaginal hysterectomy  2010  . Colonoscopy    . Upper gi endoscopy    . Knee arthroscopy with medial menisectomy Right 08/15/2013    Procedure: RIGHT KNEE ARTHROSCOPY WITH DEBRIDEMENT/SHAVING (CHRONDRPLASTY), MEDIAL AND LATERAL MENISECTOMY;  Surgeon: Nilda Simmer, MD;  Location: [facility];  Service: Orthopedics;  Laterality: Right;    Family History  Problem Relation Age of Onset  . Kidney disease Mother   . Osteoporosis Mother   . COPD Mother   . Hypertension Mother   . Hypertension Father   . Cancer Father     throat  . Kidney disease Father     kidney stones and cancer  . Alzheimer's disease Father   . Hypertension Sister   . Prostate cancer Maternal Grandfather   . Alzheimer's disease Paternal Grandmother   . Drug abuse  Paternal Grandmother   . Heart disease Paternal Grandfather   . Drug abuse Paternal Grandfather     Social History   Social History  . Marital Status: Married    Spouse Name: N/A  . Number of Children: N/A  . Years of Education: N/A   Occupational History  . Not on file.   Social History Main Topics  . Smoking status: Never Smoker   . Smokeless tobacco: Never Used  . Alcohol Use: Yes     Comment: ocasionally  . Drug Use: No  . Sexual Activity: Yes   Other Topics Concern  . Not on file   Social History Narrative   Retired Hotel manager, lives with husband    Outpatient Prescriptions Prior to Visit  Medication Sig Dispense Refill  . clonazePAM (KLONOPIN) 1 MG tablet Take 1 tablet (1 mg total) by mouth 2 (two) times daily. 60 tablet 0  . zolpidem (AMBIEN) 10 MG tablet Take 10 mg by mouth at bedtime.    . cloNIDine (CATAPRES) 0.1 MG tablet Take one tablet twice a day (Patient taking differently: 0.2 mg daily. Take one tablet twice a day) 60 tablet 1   No facility-administered medications prior to visit.    Allergies  Allergen Reactions  . Augmentin [Amoxicillin-Pot Clavulanate]     diarrhea    Review of Systems  Constitutional: Positive for  malaise/fatigue. Negative for fever.  HENT: Negative for congestion.   Eyes: Negative for blurred vision.  Respiratory: Negative for shortness of breath.   Cardiovascular: Negative for chest pain, palpitations and leg swelling.  Gastrointestinal: Positive for nausea, vomiting and abdominal pain. Negative for blood in stool.  Genitourinary: Negative for dysuria and frequency.  Musculoskeletal: Negative for falls.  Skin: Negative for rash.  Neurological: Negative for dizziness, loss of consciousness and headaches.  Endo/Heme/Allergies: Negative for environmental allergies.  Psychiatric/Behavioral: Negative for depression. The patient is not nervous/anxious.        Objective:    Physical Exam    Constitutional: She is oriented to person, place, and time. She appears well-developed and well-nourished. No distress.  HENT:  Head: Normocephalic and atraumatic.  Eyes: Conjunctivae are normal.  Neck: Neck supple. No thyromegaly present.  Cardiovascular: Normal rate, regular rhythm and normal heart sounds.   No murmur heard. Pulmonary/Chest: Effort normal and breath sounds normal. No respiratory distress.  Abdominal: Soft. Bowel sounds are normal. She exhibits no distension and no mass. There is no tenderness.  Musculoskeletal: She exhibits no edema.  Lymphadenopathy:    She has no cervical adenopathy.  Neurological: She is alert and oriented to person, place, and time.  Skin: Skin is warm and dry.  Psychiatric: She has a normal mood and affect. Her behavior is normal.    BP 100/68 mmHg  Pulse 86  Temp(Src) 97.9 F (36.6 C) (Oral)  Ht  (1.626 m)  Wt 188 lb (85.276 kg)  BMI 32.25 kg/m2  SpO2 97% Wt Readings from Last 3 Encounters:  05/11/15 188 lb (85.276 kg)  05/09/14 196 lb (88.905 kg)  02/06/14 210 lb (95.255 kg)     Lab Results  Component Value Date   WBC 9.4 12/19/2013   HGB 13.8 12/19/2013   HCT 40.3 12/19/2013   PLT 320 12/19/2013   GLUCOSE 91 05/12/2014   CHOL 246* 12/19/2013   TRIG 176* 12/19/2013   HDL 55 12/19/2013   LDLCALC 156* 12/19/2013   ALT 45* 05/12/2014   AST 28 05/12/2014   NA 139 05/12/2014   K 4.3 05/12/2014   CL 102 05/12/2014   CREATININE 0.84 05/12/2014   BUN 14 05/12/2014   CO2 26 05/12/2014   TSH 0.702 12/19/2013    Lab Results  Component Value Date   TSH 0.702 12/19/2013   Lab Results  Component Value Date   WBC 9.4 12/19/2013   HGB 13.8 12/19/2013   HCT 40.3 12/19/2013   MCV 82.1 12/19/2013   PLT 320 12/19/2013   Lab Results  Component Value Date   NA 139 05/12/2014   K 4.3 05/12/2014   CO2 26 05/12/2014   GLUCOSE 91 05/12/2014   BUN 14 05/12/2014   CREATININE 0.84 05/12/2014   BILITOT 0.5 05/12/2014    ALKPHOS 103 05/12/2014   AST 28 05/12/2014   ALT 45* 05/12/2014   PROT 7.1 05/12/2014   ALBUMIN 4.0 05/12/2014   CALCIUM 9.8 05/12/2014   Lab Results  Component Value Date   CHOL 246* 12/19/2013   Lab Results  Component Value Date   HDL 55 12/19/2013   Lab Results  Component Value Date   LDLCALC 156* 12/19/2013   Lab Results  Component Value Date   TRIG 176* 12/19/2013   Lab Results  Component Value Date   CHOLHDL 4.5 12/19/2013   No results found for: HGBA1C     Assessment & Plan:   Problem List Items Addressed This Visit  Elevated LFTs    Has seen Dr Chales Abrahams at [facility] and second opinion with Dr Ewing Schlein      Relevant Orders   CBC   TSH   Lipid panel   Comprehensive metabolic panel   Elevated serum creatinine    Follows with [facility], Dr Charlaine Dalton      Gastroenteritis    24 hours of clear fluids, then BRAT diet and advance as tolerated.       Relevant Orders   CBC   TSH   Lipid panel   Comprehensive metabolic panel   GERD (gastroesophageal reflux disease)    Occasional symptoms, controlled with good diet. Avoid offending foods      Relevant Orders   CBC   TSH   Lipid panel   Comprehensive metabolic panel   H/O sinusitis   Relevant Orders   CBC   TSH   Lipid panel   Comprehensive metabolic panel   History of chicken pox   Relevant Orders   CBC   TSH   Lipid panel   Comprehensive metabolic panel   Hx of endometriosis - Primary   Relevant Orders   CBC   TSH   Lipid panel   Comprehensive metabolic panel   Hyperlipidemia    Encouraged heart healthy diet, increase exercise, avoid trans fats, consider a krill oil cap daily      Relevant Medications   cloNIDine (CATAPRES) 0.2 MG tablet   IBS (irritable bowel syndrome)    Triggered by certain foods, start probiotics      Relevant Orders   CBC   TSH   Lipid panel   Comprehensive metabolic panel   Insomnia    Chronic x 20 years. Lunesta worked for many years then  stopped, on Ambien now but does not like it. Falls asleep but has trouble staying asleep. Encouraged good sleep hygiene such as dark, quiet room. No blue/green glowing lights such as computer screens in bedroom. No alcohol or stimulants in evening. Cut down on caffeine as able. Regular exercise is helpful but not just prior to bed time.       Relevant Orders   CBC   TSH   Lipid panel   Comprehensive metabolic panel   Multiple sclerosis (HCC)    Went through a years worth of testing, lesions are atypical on MRI, never active or changing. Dx by Harriette Bouillon at [facility], 98% certainty, spinal tap normal in 1999 and 2000, took Beta Seron, Avenex, not taking, doctor moved to [place]. Her symptoms that got her to neurology were jaw/face on right was numb (ultimately dx as TMJ and treatment resolved symptoms), MRI showed lesions at that time. No recurrent symptoms since then.      Relevant Orders   CBC   TSH   Lipid panel   Comprehensive metabolic panel   Seasonal allergies   Relevant Orders   CBC   TSH   Lipid panel   Comprehensive metabolic panel   Tear of lateral meniscus of right knee    Arthritic changes and swelling but pain improved. Has followed with Dr Thurston Hole.      Relevant Orders   CBC   TSH   Lipid panel   Comprehensive metabolic panel    Other Visit Diagnoses    Preventative health care        Relevant Orders    CBC    TSH    Lipid panel    Comprehensive metabolic panel      Pre-Visit  Info 05/10/2015 3:05 PM  Medication: Reviewed & UTD with the patient.  Preferred Pharmacy and which med where: [facility] [place] - [address] [address]  Allergies verified: UTD  Immunization Status: Flu vaccine-- 10/21/14; patient reported Tdap-- Unknown; patient does not recall when this vaccine was last given. PNA-- NA Shingles-- NA  A/P:  Changes to FH, PSH or Personal Hx: UTD Pap-- past history of hysterectomy; patient reported that  she no longer get pap smears. MMG-- 01/20/14 w/ [facility]; normal; patient reported Bone Density-- patient voiced that it was done many years ago, but she does not recall a specific date or year. CCS-- 01/21/12 w/ Dr. Lynann Bologna at [facility]; normal; patient reported.  Care Teams Updated: There are other providers/specialists that the patient sees, but she would like to discuss who they are with PCP.  ED/Hospital/Urgent Care Visits: Per the patient, no recent visits to the ED/Hospital or Urgent Care.  To Discuss with Provider: No concerns at the time of call.        I have discontinued Ms. Soller's zolpidem. I have also changed her LORazepam. Additionally, I am having her start on Eszopiclone. Lastly, I am having her maintain her clonazePAM and cloNIDine.  Meds ordered this encounter  Medications  . cloNIDine (CATAPRES) 0.2 MG tablet    Sig: Take 0.2 mg by mouth 2 (two) times daily.  Marland Kitchen DISCONTD: LORazepam (ATIVAN) 1 MG tablet    Sig: Take 1 mg by mouth as needed for anxiety.  . Eszopiclone (ESZOPICLONE) 3 MG TABS    Sig: Take 1 tablet (3 mg total) by mouth at bedtime. Take immediately before bedtime    Dispense:  30 tablet    Refill:  2  . LORazepam (ATIVAN) 1 MG tablet    Sig: Take 1 tablet (1 mg total) by mouth as needed for anxiety.    Dispense:  30 tablet    Refill:  1     Danise Edge, MD

## 2015-05-11 NOTE — Assessment & Plan Note (Signed)
Chronic x 20 years. Lunesta worked for many years then stopped, on Ambien now but does not like it. Falls asleep but has trouble staying asleep. Encouraged good sleep hygiene such as dark, quiet room. No blue/green glowing lights such as computer screens in bedroom. No alcohol or stimulants in evening. Cut down on caffeine as able. Regular exercise is helpful but not just prior to bed time.

## 2015-05-11 NOTE — Assessment & Plan Note (Signed)
Went through a years worth of testing, lesions are atypical on MRI, never active or changing. Dx by Harriette Bouillon at [facility], 98% certainty, spinal tap normal in 1999 and 2000, took Beta Seron, Avenex, not taking, doctor moved to [place]. Her symptoms that got her to neurology were jaw/face on right was numb (ultimately dx as TMJ and treatment resolved symptoms), MRI showed lesions at that time. No recurrent symptoms since then.

## 2015-05-11 NOTE — Assessment & Plan Note (Addendum)
Arthritic changes and swelling but pain improved. Has followed with Dr Thurston Hole.

## 2015-05-20 NOTE — Assessment & Plan Note (Signed)
Encouraged heart healthy diet, increase exercise, avoid trans fats, consider a krill oil cap daily 

## 2015-05-20 NOTE — Assessment & Plan Note (Signed)
Follows with [facility], Dr Charlaine Dalton

## 2015-05-20 NOTE — Assessment & Plan Note (Signed)
24 hours of clear fluids, then BRAT diet and advance as tolerated.

## 2015-07-05 ENCOUNTER — Other Ambulatory Visit: Payer: Self-pay

## 2015-07-05 ENCOUNTER — Telehealth: Payer: Self-pay | Admitting: Family Medicine

## 2015-07-05 NOTE — Telephone Encounter (Signed)
Spoke with pt. She states thinks medication was entered incorrectly on her medication list. States previous Provider was prescribing her ativan 1mg  three times a day and we documented 1mg  once a day. Pt is requesting to have this corrected and states she has been trying to make due with once a day but wants this changed in the future.  Please advise?

## 2015-07-05 NOTE — Telephone Encounter (Signed)
Please clarify with patient what her concerns are.

## 2015-07-05 NOTE — Telephone Encounter (Signed)
Called patient regarding this. States she will call back on Monday to clarify order with provider.

## 2015-07-05 NOTE — Telephone Encounter (Signed)
°  Relationship to patient: Self  Can be reached: [phone]     Reason for call: Request call back about Ativan refill. States she needs to speak with someone before it is approved for refill. Patient states that pharmacy is sending a request for refill.

## 2015-07-05 NOTE — Telephone Encounter (Signed)
Covering for Dr. Abner Greenspan who is out of office -- reviewed  controlled substance database to see how medication was being dispensed. Does seem Dr. Evelene Croon (psychiatry) was previously writing for Ativan 1 mg TID. However it seems she has been prescribed both Ativan and Klonopin at the same time by Dr. Evelene Croon. Will leave this note as FYI for Dr. Abner Greenspan to review to determine how she wants to proceed.

## 2015-07-06 NOTE — Telephone Encounter (Signed)
Please clarify with her that she understands she can only get a benzodiazepine from one doc and which one she wants to continue and then I will help

## 2015-07-09 MED ORDER — LORAZEPAM 1 MG PO TABS: 1.0000 mg | ORAL_TABLET | Freq: Three times a day (TID) | ORAL | Status: DC | PRN

## 2015-07-09 NOTE — Telephone Encounter (Signed)
OK I will prescribe please d/c Clonopin from her list and keep ativan dose the same but rewrite sig to 1 tab po tid prn and disp #40 with 2 rf but confirm that is enough for her. That amount will only work if she is taking it infrequently. If she is actually taking tid routinely am willing to write #90 with 1 but she would need to come back in to discuss more before that ran out.

## 2015-07-09 NOTE — Addendum Note (Signed)
Addended by: Scharlene Gloss B on: 07/09/2015 01:46 PM   Modules accepted: Orders, Medications

## 2015-07-09 NOTE — Telephone Encounter (Signed)
Spoke to patient and she does not take klonopin. She would like PCP to refill ativan (please read Clover Mealy PA info her got from [facility] database on her ativan and that is prescribed previously to take tid) Please [facility].

## 2015-07-09 NOTE — Telephone Encounter (Signed)
Spoke to the patient and she takes tid prn so #40 is ok. Printed and faxed to [facility] as PCP instructed

## 2015-08-03 ENCOUNTER — Other Ambulatory Visit: Payer: Self-pay | Admitting: Family Medicine

## 2015-08-03 MED ORDER — ESZOPICLONE 3 MG PO TABS: 3.0000 mg | ORAL_TABLET | Freq: Every day | ORAL | Status: DC

## 2015-08-06 ENCOUNTER — Other Ambulatory Visit: Payer: 59

## 2015-08-06 NOTE — Telephone Encounter (Signed)
Faxed hardcopy to [facility] (Eszopiclone)

## 2015-08-10 ENCOUNTER — Ambulatory Visit: Payer: 59 | Admitting: Family Medicine

## 2015-08-15 ENCOUNTER — Other Ambulatory Visit: Payer: 59

## 2015-08-21 ENCOUNTER — Ambulatory Visit: Payer: 59 | Admitting: Family Medicine

## 2015-09-13 ENCOUNTER — Other Ambulatory Visit: Payer: 59

## 2015-09-18 ENCOUNTER — Encounter: Payer: Self-pay | Admitting: Family Medicine

## 2015-09-18 ENCOUNTER — Ambulatory Visit (INDEPENDENT_AMBULATORY_CARE_PROVIDER_SITE_OTHER): Payer: 59 | Admitting: Family Medicine

## 2015-09-18 VITALS — BP 142/110 | HR 122 | Temp 98.6°F | Ht 65.0 in | Wt 200.4 lb

## 2015-09-18 DIAGNOSIS — E785 Hyperlipidemia, unspecified: Secondary | ICD-10-CM | POA: Diagnosis not present

## 2015-09-18 DIAGNOSIS — R7989 Other specified abnormal findings of blood chemistry: Secondary | ICD-10-CM

## 2015-09-18 DIAGNOSIS — G4700 Insomnia, unspecified: Secondary | ICD-10-CM

## 2015-09-18 DIAGNOSIS — G35 Multiple sclerosis: Secondary | ICD-10-CM | POA: Diagnosis not present

## 2015-09-18 DIAGNOSIS — K219 Gastro-esophageal reflux disease without esophagitis: Secondary | ICD-10-CM | POA: Diagnosis not present

## 2015-09-18 DIAGNOSIS — R748 Abnormal levels of other serum enzymes: Secondary | ICD-10-CM | POA: Diagnosis not present

## 2015-09-18 DIAGNOSIS — I10 Essential (primary) hypertension: Secondary | ICD-10-CM

## 2015-09-18 DIAGNOSIS — Z0000 Encounter for general adult medical examination without abnormal findings: Secondary | ICD-10-CM

## 2015-09-18 DIAGNOSIS — F419 Anxiety disorder, unspecified: Secondary | ICD-10-CM

## 2015-09-18 MED ORDER — METOPROLOL SUCCINATE ER 25 MG PO TB24: 25.0000 mg | ORAL_TABLET | Freq: Every day | ORAL | 3 refills | Status: DC

## 2015-09-18 MED ORDER — ESCITALOPRAM OXALATE 10 MG PO TABS
10.0000 mg | ORAL_TABLET | Freq: Every day | ORAL | 3 refills | Status: DC
Start: 2015-09-18 — End: 2018-08-16

## 2015-09-18 NOTE — Patient Instructions (Signed)
Hypertension Hypertension, commonly called high blood pressure, is when the force of blood pumping through your arteries is too strong. Your arteries are the blood vessels that carry blood from your heart throughout your body. A blood pressure reading consists of a higher number over a lower number, such as 110/72. The higher number (systolic) is the pressure inside your arteries when your heart pumps. The lower number (diastolic) is the pressure inside your arteries when your heart relaxes. Ideally you want your blood pressure below 120/80. Hypertension forces your heart to work harder to pump blood. Your arteries may become narrow or stiff. Having untreated or uncontrolled hypertension can cause heart attack, stroke, kidney disease, and other problems. RISK FACTORS Some risk factors for high blood pressure are controllable. Others are not.  Risk factors you cannot control include:   Race. You may be at higher risk if you are African American.  Age. Risk increases with age.  Gender. Men are at higher risk than women before age 45 years. After age 65, women are at higher risk than men. Risk factors you can control include:  Not getting enough exercise or physical activity.  Being overweight.  Getting too much fat, sugar, calories, or salt in your diet.  Drinking too much alcohol. SIGNS AND SYMPTOMS Hypertension does not usually cause signs or symptoms. Extremely high blood pressure (hypertensive crisis) may cause headache, anxiety, shortness of breath, and nosebleed. DIAGNOSIS To check if you have hypertension, your health care provider will measure your blood pressure while you are seated, with your arm held at the level of your heart. It should be measured at least twice using the same arm. Certain conditions can cause a difference in blood pressure between your right and left arms. A blood pressure reading that is higher than normal on one occasion does not mean that you need treatment. If  it is not clear whether you have high blood pressure, you may be asked to return on a different day to have your blood pressure checked again. Or, you may be asked to monitor your blood pressure at home for 1 or more weeks. TREATMENT Treating high blood pressure includes making lifestyle changes and possibly taking medicine. Living a healthy lifestyle can help lower high blood pressure. You may need to change some of your habits. Lifestyle changes may include:  Following the DASH diet. This diet is high in fruits, vegetables, and whole grains. It is low in salt, red meat, and added sugars.  Keep your sodium intake below 2,300 mg per day.  Getting at least 30-45 minutes of aerobic exercise at least 4 times per week.  Losing weight if necessary.  Not smoking.  Limiting alcoholic beverages.  Learning ways to reduce stress. Your health care provider may prescribe medicine if lifestyle changes are not enough to get your blood pressure under control, and if one of the following is true:  You are 18-59 years of age and your systolic blood pressure is above 140.  You are 60 years of age or older, and your systolic blood pressure is above 150.  Your diastolic blood pressure is above 90.  You have diabetes, and your systolic blood pressure is over 140 or your diastolic blood pressure is over 90.  You have kidney disease and your blood pressure is above 140/90.  You have heart disease and your blood pressure is above 140/90. Your personal target blood pressure may vary depending on your medical conditions, your age, and other factors. HOME CARE INSTRUCTIONS    Have your blood pressure rechecked as directed by your health care provider.   Take medicines only as directed by your health care provider. Follow the directions carefully. Blood pressure medicines must be taken as prescribed. The medicine does not work as well when you skip doses. Skipping doses also puts you at risk for  problems.  Do not smoke.   Monitor your blood pressure at home as directed by your health care provider. SEEK MEDICAL CARE IF:   You think you are having a reaction to medicines taken.  You have recurrent headaches or feel dizzy.  You have swelling in your ankles.  You have trouble with your vision. SEEK IMMEDIATE MEDICAL CARE IF:  You develop a severe headache or confusion.  You have unusual weakness, numbness, or feel faint.  You have severe chest or abdominal pain.  You vomit repeatedly.  You have trouble breathing. MAKE SURE YOU:   Understand these instructions.  Will watch your condition.  Will get help right away if you are not doing well or get worse.   This information is not intended to replace advice given to you by your health care provider. Make sure you discuss any questions you have with your health care provider.   Document Released: 01/06/2005 Document Revised: 05/23/2014 Document Reviewed: 10/29/2012 Elsevier Interactive Patient Education 2016 Elsevier Inc.  

## 2015-09-18 NOTE — Progress Notes (Signed)
Pre visit review using our clinic review tool, if applicable. No additional management support is needed unless otherwise documented below in the visit note. 

## 2015-09-30 ENCOUNTER — Encounter: Payer: Self-pay | Admitting: Family Medicine

## 2015-09-30 DIAGNOSIS — I10 Essential (primary) hypertension: Secondary | ICD-10-CM

## 2015-09-30 HISTORY — DX: Essential (primary) hypertension: I10

## 2015-09-30 NOTE — Assessment & Plan Note (Signed)
Add Metoprolol. Poorly controlled will alter medications, encouraged DASH diet, minimize caffeine and obtain adequate sleep. Report concerning symptoms and follow up as directed and as needed

## 2015-09-30 NOTE — Assessment & Plan Note (Signed)
Avoid offending foods, start probiotics. Do not eat large meals in late evening and consider raising head of bed.  

## 2015-09-30 NOTE — Assessment & Plan Note (Signed)
Following with psychiatry, Dr Lafayette Dragonarr. Doing ell

## 2015-09-30 NOTE — Assessment & Plan Note (Signed)
Encouraged heart healthy diet, increase exercise, avoid trans fats, consider a krill oil cap daily 

## 2015-09-30 NOTE — Assessment & Plan Note (Signed)
Following with neurology. Doing well

## 2015-09-30 NOTE — Progress Notes (Signed)
Patient ID: Emily Davenport, female   DOB: 1960/09/04, 55 y.o.   MRN: [id number]   Subjective:    Patient ID: Emily Davenport, female    DOB: 1960/09/04, 55 y.o.   MRN: [id number]  Chief Complaint  Patient presents with  . Follow-up    HPI Patient is in today for follow up. Continues to struggle with high levels of anxiety and is following with psychiatry. No recent illness. No hospitalization. Denies CP/palp/SOB/HA/congestion/fevers/GI or GU c/o. Taking meds as prescribed  Past Medical History:  Diagnosis Date  . Anxiety   . Benign essential HTN 09/30/2015  . Depression   . GERD (gastroesophageal reflux disease)   . H/O sinusitis 05/11/2015  . History of chicken pox 05/11/2015  . History of chicken pox 05/11/2015  . Hx of endometriosis 03/28/2013  . Hyperlipidemia   . Insomnia   . Multiple sclerosis (HCC)   . Seasonal allergies 05/11/2015  . Tear of lateral meniscus of right knee     Past Surgical History:  Procedure Laterality Date  . BLADDER SURGERY  2009   sling  . COLONOSCOPY    . KNEE ARTHROSCOPY WITH MEDIAL MENISECTOMY Right 08/15/2013   Procedure: RIGHT KNEE ARTHROSCOPY WITH DEBRIDEMENT/SHAVING (CHRONDRPLASTY), MEDIAL AND LATERAL MENISECTOMY;  Surgeon: Nilda Simmerobert A Wainer, MD;  Location: [facility];  Service: Orthopedics;  Laterality: Right;  . NASAL SINUS SURGERY  2000  . planterfaciitis  1998   right  . RHINOPLASTY  1979  . UPPER GI ENDOSCOPY    . VAGINAL HYSTERECTOMY  2010    Family History  Problem Relation Age of Onset  . Kidney disease Mother   . Osteoporosis Mother   . COPD Mother   . Hypertension Mother   . Hypertension Father   . Cancer Father     throat  . Kidney disease Father     kidney stones and cancer  . Alzheimer's disease Father   . Hypertension Sister   . Prostate cancer Maternal Grandfather   . Alzheimer's disease Paternal Grandmother   . Drug abuse Paternal Grandmother   . Heart disease Paternal Grandfather   . Drug abuse  Paternal Grandfather     Social History   Social History  . Marital status: Married    Spouse name: N/A  . Number of children: N/A  . Years of education: N/A   Occupational History  . Not on file.   Social History Main Topics  . Smoking status: Never Smoker  . Smokeless tobacco: Never Used  . Alcohol use Yes     Comment: ocasionally  . Drug use: No  . Sexual activity: Yes   Other Topics Concern  . Not on file   Social History Narrative   [place] [employer], lives with husband    Outpatient Medications Prior to Visit  Medication Sig Dispense Refill  . cloNIDine (CATAPRES) 0.2 MG tablet Take 0.2 mg by mouth 2 (two) times daily.    Marland Kitchen. LORazepam (ATIVAN) 1 MG tablet Take 1 tablet (1 mg total) by mouth 3 (three) times daily as needed for anxiety. 40 tablet 1  . Eszopiclone (ESZOPICLONE) 3 MG TABS Take 1 tablet (3 mg total) by mouth at bedtime. Take immediately before bedtime 30 tablet 2   No facility-administered medications prior to visit.     Allergies  Allergen Reactions  . Augmentin [Amoxicillin-Pot Clavulanate]     diarrhea    Review of Systems  Constitutional: Negative for chills, fever and malaise/fatigue.  HENT: Negative  for congestion and hearing loss.   Eyes: Negative for discharge.  Respiratory: Negative for cough, sputum production and shortness of breath.   Cardiovascular: Negative for chest pain, palpitations and leg swelling.  Gastrointestinal: Negative for abdominal pain, blood in stool, constipation, diarrhea, heartburn, nausea and vomiting.  Genitourinary: Negative for dysuria, frequency, hematuria and urgency.  Musculoskeletal: Negative for back pain, falls and myalgias.  Skin: Negative for rash.  Neurological: Negative for dizziness, sensory change, loss of consciousness, weakness and headaches.  Endo/Heme/Allergies: Negative for environmental allergies. Does not bruise/bleed easily.  Psychiatric/Behavioral: Negative for  depression and suicidal ideas. The patient is nervous/anxious. The patient does not have insomnia.        Objective:    Physical Exam  Constitutional: She is oriented to person, place, and time. She appears well-developed and well-nourished. No distress.  HENT:  Head: Normocephalic and atraumatic.  Nose: Nose normal.  Eyes: Right eye exhibits no discharge. Left eye exhibits no discharge.  Neck: Normal range of motion. Neck supple.  Cardiovascular: Normal rate and regular rhythm.   No murmur heard. Pulmonary/Chest: Effort normal and breath sounds normal.  Abdominal: Soft. Bowel sounds are normal. There is no tenderness.  Musculoskeletal: She exhibits no edema.  Neurological: She is alert and oriented to person, place, and time.  Skin: Skin is warm and dry.  Psychiatric: She has a normal mood and affect.  Nursing note and vitals reviewed.   BP (!) 142/110 (BP Location: Left Arm, Patient Position: Sitting, Cuff Size: Large)   Pulse (!) 122   Temp 98.6 F (37 C) (Oral)   Ht 5\' 5"  (1.651 m)   Wt 200 lb 6 oz (90.9 kg)   SpO2 97%   BMI 33.34 kg/m  Wt Readings from Last 3 Encounters:  09/18/15 200 lb 6 oz (90.9 kg)  05/11/15 188 lb (85.3 kg)  05/09/14 196 lb (88.9 kg)     Lab Results  Component Value Date   WBC 9.4 12/19/2013   HGB 13.8 12/19/2013   HCT 40.3 12/19/2013   PLT 320 12/19/2013   GLUCOSE 91 05/12/2014   CHOL 246 (H) 12/19/2013   TRIG 176 (H) 12/19/2013   HDL 55 12/19/2013   LDLCALC 156 (H) 12/19/2013   ALT 45 (H) 05/12/2014   AST 28 05/12/2014   NA 139 05/12/2014   K 4.3 05/12/2014   CL 102 05/12/2014   CREATININE 0.84 05/12/2014   BUN 14 05/12/2014   CO2 26 05/12/2014   TSH 0.702 12/19/2013    Lab Results  Component Value Date   TSH 0.702 12/19/2013   Lab Results  Component Value Date   WBC 9.4 12/19/2013   HGB 13.8 12/19/2013   HCT 40.3 12/19/2013   MCV 82.1 12/19/2013   PLT 320 12/19/2013   Lab Results  Component Value Date   NA 139  05/12/2014   K 4.3 05/12/2014   CO2 26 05/12/2014   GLUCOSE 91 05/12/2014   BUN 14 05/12/2014   CREATININE 0.84 05/12/2014   BILITOT 0.5 05/12/2014   ALKPHOS 103 05/12/2014   AST 28 05/12/2014   ALT 45 (H) 05/12/2014   PROT 7.1 05/12/2014   ALBUMIN 4.0 05/12/2014   CALCIUM 9.8 05/12/2014   Lab Results  Component Value Date   CHOL 246 (H) 12/19/2013   Lab Results  Component Value Date   HDL 55 12/19/2013   Lab Results  Component Value Date   LDLCALC 156 (H) 12/19/2013   Lab Results  Component Value Date  TRIG 176 (H) 12/19/2013   Lab Results  Component Value Date   CHOLHDL 4.5 12/19/2013   No results found for: HGBA1C     Assessment & Plan:   Problem List Items Addressed This Visit    Anxiety    Following with psychiatry, Dr Lafayette Dragon. Doing ell      Relevant Medications   traZODone (DESYREL) 100 MG tablet   escitalopram (LEXAPRO) 10 MG tablet   GERD (gastroesophageal reflux disease)    Avoid offending foods, start probiotics. Do not eat large meals in late evening and consider raising head of bed.       Relevant Orders   TSH   CBC   Hyperlipidemia    Encouraged heart healthy diet, increase exercise, avoid trans fats, consider a krill oil cap daily      Relevant Medications   metoprolol succinate (TOPROL-XL) 25 MG 24 hr tablet   Other Relevant Orders   Lipid panel   Multiple sclerosis (HCC) - Primary    Following with neurology. Doing well      Insomnia   Relevant Orders   TSH   Elevated serum creatinine   Relevant Orders   Comprehensive metabolic panel   Benign essential HTN    Add Metoprolol. Poorly controlled will alter medications, encouraged DASH diet, minimize caffeine and obtain adequate sleep. Report concerning symptoms and follow up as directed and as needed      Relevant Medications   metoprolol succinate (TOPROL-XL) 25 MG 24 hr tablet    Other Visit Diagnoses    Preventative health care       Relevant Orders   TSH   CBC    Hepatitis C antibody   Lipid panel   Comprehensive metabolic panel      I have discontinued Ms. Guia's Eszopiclone. I am also having her start on metoprolol succinate and escitalopram. Additionally, I am having her maintain her cloNIDine, LORazepam, zolpidem, and traZODone.  Meds ordered this encounter  Medications  . zolpidem (AMBIEN) 10 MG tablet    Sig: Take 10 mg by mouth at bedtime as needed for sleep.  . traZODone (DESYREL) 100 MG tablet    Sig: Take 100 mg by mouth at bedtime.  . metoprolol succinate (TOPROL-XL) 25 MG 24 hr tablet    Sig: Take 1 tablet (25 mg total) by mouth daily.    Dispense:  30 tablet    Refill:  3  . escitalopram (LEXAPRO) 10 MG tablet    Sig: Take 1 tablet (10 mg total) by mouth daily.    Dispense:  30 tablet    Refill:  3     Danise Edge, MD

## 2015-11-19 ENCOUNTER — Encounter: Payer: 59 | Admitting: Family Medicine

## 2016-02-26 ENCOUNTER — Encounter: Payer: 59 | Admitting: Family Medicine

## 2017-06-04 ENCOUNTER — Encounter: Payer: Self-pay | Admitting: Gastroenterology

## 2018-08-16 ENCOUNTER — Other Ambulatory Visit: Payer: Self-pay

## 2018-08-16 ENCOUNTER — Encounter: Payer: Self-pay | Admitting: Family Medicine

## 2018-08-16 ENCOUNTER — Ambulatory Visit (INDEPENDENT_AMBULATORY_CARE_PROVIDER_SITE_OTHER): Payer: 59 | Admitting: Family Medicine

## 2018-08-16 VITALS — BP 126/94 | HR 79 | Temp 98.6°F | Resp 18 | Ht 64.0 in | Wt 227.4 lb

## 2018-08-16 DIAGNOSIS — E785 Hyperlipidemia, unspecified: Secondary | ICD-10-CM | POA: Diagnosis not present

## 2018-08-16 DIAGNOSIS — Z1239 Encounter for other screening for malignant neoplasm of breast: Secondary | ICD-10-CM

## 2018-08-16 DIAGNOSIS — N289 Disorder of kidney and ureter, unspecified: Secondary | ICD-10-CM | POA: Diagnosis not present

## 2018-08-16 DIAGNOSIS — R109 Unspecified abdominal pain: Secondary | ICD-10-CM | POA: Diagnosis not present

## 2018-08-16 DIAGNOSIS — F419 Anxiety disorder, unspecified: Secondary | ICD-10-CM

## 2018-08-16 DIAGNOSIS — I10 Essential (primary) hypertension: Secondary | ICD-10-CM | POA: Diagnosis not present

## 2018-08-16 DIAGNOSIS — G35 Multiple sclerosis: Secondary | ICD-10-CM

## 2018-08-16 DIAGNOSIS — R7989 Other specified abnormal findings of blood chemistry: Secondary | ICD-10-CM

## 2018-08-16 DIAGNOSIS — Z23 Encounter for immunization: Secondary | ICD-10-CM | POA: Diagnosis not present

## 2018-08-16 DIAGNOSIS — E669 Obesity, unspecified: Secondary | ICD-10-CM

## 2018-08-16 NOTE — Patient Instructions (Addendum)
Shingrix is the new shingles shot  Omron blood pressure cuff Pulse oximeter  Pancreatic/digestive enzymes at fatty meals Hypertension, Adult High blood pressure (hypertension) is when the force of blood pumping through the arteries is too strong. The arteries are the blood vessels that carry blood from the heart throughout the body. Hypertension forces the heart to work harder to pump blood and may cause arteries to become narrow or stiff. Untreated or uncontrolled hypertension can cause a heart attack, heart failure, a stroke, kidney disease, and other problems. A blood pressure reading consists of a higher number over a lower number. Ideally, your blood pressure should be below 120/80. The first ("top") number is called the systolic pressure. It is a measure of the pressure in your arteries as your heart beats. The second ("bottom") number is called the diastolic pressure. It is a measure of the pressure in your arteries as the heart relaxes. What are the causes? The exact cause of this condition is not known. There are some conditions that result in or are related to high blood pressure. What increases the risk? Some risk factors for high blood pressure are under your control. The following factors may make you more likely to develop this condition:  Smoking.  Having type 2 diabetes mellitus, high cholesterol, or both.  Not getting enough exercise or physical activity.  Being overweight.  Having too much fat, sugar, calories, or salt (sodium) in your diet.  Drinking too much alcohol. Some risk factors for high blood pressure may be difficult or impossible to change. Some of these factors include:  Having chronic kidney disease.  Having a family history of high blood pressure.  Age. Risk increases with age.  Race. You may be at higher risk if you are African American.  Gender. Men are at higher risk than women before age 51. After age 57, women are at higher risk than men.   Having obstructive sleep apnea.  Stress. What are the signs or symptoms? High blood pressure may not cause symptoms. Very high blood pressure (hypertensive crisis) may cause:  Headache.  Anxiety.  Shortness of breath.  Nosebleed.  Nausea and vomiting.  Vision changes.  Severe chest pain.  Seizures. How is this diagnosed? This condition is diagnosed by measuring your blood pressure while you are seated, with your arm resting on a flat surface, your legs uncrossed, and your feet flat on the floor. The cuff of the blood pressure monitor will be placed directly against the skin of your upper arm at the level of your heart. It should be measured at least twice using the same arm. Certain conditions can cause a difference in blood pressure between your right and left arms. Certain factors can cause blood pressure readings to be lower or higher than normal for a short period of time:  When your blood pressure is higher when you are in a health care provider's office than when you are at home, this is called white coat hypertension. Most people with this condition do not need medicines.  When your blood pressure is higher at home than when you are in a health care provider's office, this is called masked hypertension. Most people with this condition may need medicines to control blood pressure. If you have a high blood pressure reading during one visit or you have normal blood pressure with other risk factors, you may be asked to:  Return on a different day to have your blood pressure checked again.  Monitor your blood pressure  at home for 1 week or longer. If you are diagnosed with hypertension, you may have other blood or imaging tests to help your health care provider understand your overall risk for other conditions. How is this treated? This condition is treated by making healthy lifestyle changes, such as eating healthy foods, exercising more, and reducing your alcohol intake. Your  health care provider may prescribe medicine if lifestyle changes are not enough to get your blood pressure under control, and if:  Your systolic blood pressure is above 130.  Your diastolic blood pressure is above 80. Your personal target blood pressure may vary depending on your medical conditions, your age, and other factors. Follow these instructions at home: Eating and drinking   Eat a diet that is high in fiber and potassium, and low in sodium, added sugar, and fat. An example eating plan is called the DASH (Dietary Approaches to Stop Hypertension) diet. To eat this way: ? Eat plenty of fresh fruits and vegetables. Try to fill one half of your plate at each meal with fruits and vegetables. ? Eat whole grains, such as whole-wheat pasta, brown rice, or whole-grain bread. Fill about one fourth of your plate with whole grains. ? Eat or drink low-fat dairy products, such as skim milk or low-fat yogurt. ? Avoid fatty cuts of meat, processed or cured meats, and poultry with skin. Fill about one fourth of your plate with lean proteins, such as fish, chicken without skin, beans, eggs, or tofu. ? Avoid pre-made and processed foods. These tend to be higher in sodium, added sugar, and fat.  Reduce your daily sodium intake. Most people with hypertension should eat less than 1,500 mg of sodium a day.  Do not drink alcohol if: ? Your health care provider tells you not to drink. ? You are pregnant, may be pregnant, or are planning to become pregnant.  If you drink alcohol: ? Limit how much you use to:  0-1 drink a day for women.  0-2 drinks a day for men. ? Be aware of how much alcohol is in your drink. In the [place] one drink equals one 12 oz bottle of beer (355 mL), one 5 oz glass of wine (148 mL), or one 1 oz glass of hard liquor (44 mL). Lifestyle   Work with your health care provider to maintain a healthy body weight or to lose weight. Ask what an ideal weight is for you.  Get at least  30 minutes of exercise most days of the week. Activities may include walking, swimming, or biking.  Include exercise to strengthen your muscles (resistance exercise), such as Pilates or lifting weights, as part of your weekly exercise routine. Try to do these types of exercises for 30 minutes at least 3 days a week.  Do not use any products that contain nicotine or tobacco, such as cigarettes, e-cigarettes, and chewing tobacco. If you need help quitting, ask your health care provider.  Monitor your blood pressure at home as told by your health care provider.  Keep all follow-up visits as told by your health care provider. This is important. Medicines  Take over-the-counter and prescription medicines only as told by your health care provider. Follow directions carefully. Blood pressure medicines must be taken as prescribed.  Do not skip doses of blood pressure medicine. Doing this puts you at risk for problems and can make the medicine less effective.  Ask your health care provider about side effects or reactions to medicines that you should watch  for. Contact a health care provider if you:  Think you are having a reaction to a medicine you are taking.  Have headaches that keep coming back (recurring).  Feel dizzy.  Have swelling in your ankles.  Have trouble with your vision. Get help right away if you:  Develop a severe headache or confusion.  Have unusual weakness or numbness.  Feel faint.  Have severe pain in your chest or abdomen.  Vomit repeatedly.  Have trouble breathing. Summary  Hypertension is when the force of blood pumping through your arteries is too strong. If this condition is not controlled, it may put you at risk for serious complications.  Your personal target blood pressure may vary depending on your medical conditions, your age, and other factors. For most people, a normal blood pressure is less than 120/80.  Hypertension is treated with lifestyle  changes, medicines, or a combination of both. Lifestyle changes include losing weight, eating a healthy, low-sodium diet, exercising more, and limiting alcohol. This information is not intended to replace advice given to you by your health care provider. Make sure you discuss any questions you have with your health care provider. Document Released: 01/06/2005 Document Revised: 09/16/2017 Document Reviewed: 09/16/2017 Elsevier Patient Education  2020 [employer].

## 2018-08-17 LAB — COMPREHENSIVE METABOLIC PANEL
ALT: 101 U/L — ABNORMAL HIGH (ref 0–35)
AST: 60 U/L — ABNORMAL HIGH (ref 0–37)
Albumin: 4.5 g/dL (ref 3.5–5.2)
Alkaline Phosphatase: 128 U/L — ABNORMAL HIGH (ref 39–117)
BUN: 20 mg/dL (ref 6–23)
CO2: 27 mEq/L (ref 19–32)
Calcium: 9.9 mg/dL (ref 8.4–10.5)
Chloride: 102 mEq/L (ref 96–112)
Creatinine, Ser: 1.05 mg/dL (ref 0.40–1.20)
GFR: 53.81 mL/min — ABNORMAL LOW (ref >60.00)
Glucose, Bld: 103 mg/dL — ABNORMAL HIGH (ref 70–99)
Potassium: 4.5 mEq/L (ref 3.5–5.1)
Sodium: 137 mEq/L (ref 135–145)
Total Bilirubin: 0.5 mg/dL (ref 0.2–1.2)
Total Protein: 7.7 g/dL (ref 6.0–8.3)

## 2018-08-17 LAB — LIPASE: Lipase: 44 U/L (ref 11.0–59.0)

## 2018-08-17 LAB — LIPID PANEL
Cholesterol: 241 mg/dL — ABNORMAL HIGH (ref 0–200)
HDL: 52.2 mg/dL (ref >39.00)
LDL Cholesterol: 160 mg/dL — ABNORMAL HIGH (ref 0–99)
NonHDL: 188.37
Total CHOL/HDL Ratio: 5
Triglycerides: 144 mg/dL (ref 0.0–149.0)
VLDL: 28.8 mg/dL (ref 0.0–40.0)

## 2018-08-17 LAB — CBC
HCT: 43.8 % (ref 36.0–46.0)
Hemoglobin: 14.4 g/dL (ref 12.0–15.0)
MCHC: 33 g/dL (ref 30.0–36.0)
MCV: 86 fl (ref 78.0–100.0)
Platelets: 258 10*3/uL (ref 150.0–400.0)
RBC: 5.09 Mil/uL (ref 3.87–5.11)
RDW: 13.5 % (ref 11.5–15.5)
WBC: 6.7 10*3/uL (ref 4.0–10.5)

## 2018-08-17 LAB — TSH: TSH: 1.18 u[IU]/mL (ref 0.35–4.50)

## 2018-08-17 LAB — PARATHYROID HORMONE, INTACT (NO CA): PTH: 89 pg/mL — ABNORMAL HIGH (ref 14–64)

## 2018-08-17 LAB — VITAMIN D 25 HYDROXY (VIT D DEFICIENCY, FRACTURES): VITD: 10.25 ng/mL — ABNORMAL LOW (ref 30.00–100.00)

## 2018-08-17 LAB — AMYLASE: Amylase: 55 U/L (ref 27–131)

## 2018-08-18 ENCOUNTER — Encounter: Payer: Self-pay | Admitting: Family Medicine

## 2018-08-18 DIAGNOSIS — R7989 Other specified abnormal findings of blood chemistry: Secondary | ICD-10-CM | POA: Insufficient documentation

## 2018-08-18 DIAGNOSIS — E669 Obesity, unspecified: Secondary | ICD-10-CM | POA: Insufficient documentation

## 2018-08-18 DIAGNOSIS — E559 Vitamin D deficiency, unspecified: Secondary | ICD-10-CM | POA: Insufficient documentation

## 2018-08-18 HISTORY — DX: Other specified abnormal findings of blood chemistry: R79.89

## 2018-08-18 NOTE — Assessment & Plan Note (Signed)
Normal calcium, low vitamin D and asymptomatic, will monitor.

## 2018-08-18 NOTE — Assessment & Plan Note (Signed)
Was diagnosed with this years ago after an episode of numbness on her face but has had no further episodes for many years.

## 2018-08-18 NOTE — Assessment & Plan Note (Signed)
She notes significant anxiety and she acknowledges much of it centers around the anxiety of getting ill and somatatization. She feels she is doing better now.

## 2018-08-18 NOTE — Assessment & Plan Note (Signed)
Hydrate and monitor is mild

## 2018-08-18 NOTE — Assessment & Plan Note (Signed)
Encouraged DASH diet, decrease po intake and increase exercise as tolerated. Needs 7-8 hours of sleep nightly. Avoid trans fats, eat small, frequent meals every 4-5 hours with lean proteins, complex carbs and healthy fats. Minimize simple carbs, GMO foods. Referred to healthy weight and wellness.  

## 2018-08-18 NOTE — Assessment & Plan Note (Signed)
Encouraged to monitor vitals weekly, no changes to meds. Encouraged heart healthy diet such as the DASH diet and exercise as tolerated.

## 2018-08-18 NOTE — Progress Notes (Signed)
Subjective:    Patient ID: Emily Davenport, female    DOB: 1960-07-11, 58 y.o.   MRN: [id number]  No chief complaint on file.   HPI Patient is in today for visit to reestablish care. She has not been here in 3 years and she has been seeing Dr Chales AbrahamsGupta of gastroenterology and many other specialists. She feels well today and accompanied by her husband. She acknowledges her anxiety has been consumed by health fears and somatatizations. No recent febrile illness or hospitalizations. Denies CP/palp/SOB/HA/congestion/fevers/GI or GU c/o. Taking meds as prescribed  Past Medical History:  Diagnosis Date  . Anxiety   . Benign essential HTN 09/30/2015  . Depression   . GERD (gastroesophageal reflux disease)   . H/O sinusitis 05/11/2015  . History of chicken pox 05/11/2015  . History of chicken pox 05/11/2015  . Hx of endometriosis 03/28/2013  . Hyperlipidemia   . Insomnia   . Multiple sclerosis (HCC)   . Renal insufficiency 12/20/2013  . Seasonal allergies 05/11/2015  . Tear of lateral meniscus of right knee     Past Surgical History:  Procedure Laterality Date  . BLADDER SURGERY  2009   sling  . COLONOSCOPY    . KNEE ARTHROSCOPY WITH MEDIAL MENISECTOMY Right 08/15/2013   Procedure: RIGHT KNEE ARTHROSCOPY WITH DEBRIDEMENT/SHAVING (CHRONDRPLASTY), MEDIAL AND LATERAL MENISECTOMY;  Surgeon: Nilda Simmerobert A Wainer, MD;  Location: [facility];  Service: Orthopedics;  Laterality: Right;  . NASAL SINUS SURGERY  2000  . planterfaciitis  1998   right  . RHINOPLASTY  1979  . UPPER GI ENDOSCOPY    . VAGINAL HYSTERECTOMY  2010    Family History  Problem Relation Age of Onset  . Kidney disease Mother   . Osteoporosis Mother   . COPD Mother   . Hypertension Mother   . Hypertension Father   . Cancer Father        throat  . Kidney disease Father        kidney stones and cancer  . Alzheimer's disease Father   . Hypertension Sister   . Prostate cancer Maternal Grandfather   . Alzheimer's  disease Paternal Grandmother   . Drug abuse Paternal Grandmother   . Heart disease Paternal Grandfather   . Drug abuse Paternal Grandfather     Social History   Socioeconomic History  . Marital status: Married    Spouse name: Not on file  . Number of children: Not on file  . Years of education: Not on file  . Highest education level: Not on file  Occupational History  . Not on file  Social Needs  . Financial resource strain: Not on file  . Food insecurity    Worry: Not on file    Inability: Not on file  . Transportation needs    Medical: Not on file    Non-medical: Not on file  Tobacco Use  . Smoking status: Never Smoker  . Smokeless tobacco: Never Used  Substance and Sexual Activity  . Alcohol use: Yes    Comment: ocasionally  . Drug use: No  . Sexual activity: Yes  Lifestyle  . Physical activity    Days per week: Not on file    Minutes per session: Not on file  . Stress: Not on file  Relationships  . Social Musicianconnections    Talks on phone: Not on file    Gets together: Not on file    Attends religious service: Not on file    Active  member of club or organization: Not on file    Attends meetings of clubs or organizations: Not on file    Relationship status: Not on file  . Intimate partner violence    Fear of current or ex partner: Not on file    Emotionally abused: Not on file    Physically abused: Not on file    Forced sexual activity: Not on file  Other Topics Concern  . Not on file  Social History Narrative   Retired Sales executive, lives with husband    Outpatient Medications Prior to Visit  Medication Sig Dispense Refill  . cloNIDine (CATAPRES) 0.2 MG tablet Take 0.2 mg by mouth 2 (two) times daily.    Marland Kitchen FLUoxetine (PROZAC) 20 MG capsule     . LORazepam (ATIVAN) 1 MG tablet Take 1 tablet (1 mg total) by mouth 3 (three) times daily as needed for anxiety. 40 tablet 1  . zolpidem (AMBIEN) 10 MG tablet Take 10 mg by mouth at bedtime as needed  for sleep.    Marland Kitchen escitalopram (LEXAPRO) 10 MG tablet Take 1 tablet (10 mg total) by mouth daily. 30 tablet 3  . metoprolol succinate (TOPROL-XL) 25 MG 24 hr tablet Take 1 tablet (25 mg total) by mouth daily. 30 tablet 3  . traZODone (DESYREL) 100 MG tablet Take 100 mg by mouth at bedtime.     No facility-administered medications prior to visit.     Allergies  Allergen Reactions  . Augmentin [Amoxicillin-Pot Clavulanate]     diarrhea    Review of Systems  Constitutional: Negative for fever and malaise/fatigue.  HENT: Negative for congestion.   Eyes: Negative for blurred vision.  Respiratory: Negative for shortness of breath.   Cardiovascular: Negative for chest pain, palpitations and leg swelling.  Gastrointestinal: Negative for abdominal pain, blood in stool and nausea.  Genitourinary: Negative for dysuria and frequency.  Musculoskeletal: Negative for falls.  Skin: Negative for rash.  Neurological: Negative for dizziness, loss of consciousness and headaches.  Endo/Heme/Allergies: Negative for environmental allergies.  Psychiatric/Behavioral: Positive for depression. The patient is nervous/anxious.        Objective:    Physical Exam Constitutional:      General: She is not in acute distress.    Appearance: She is not diaphoretic.  HENT:     Head: Normocephalic and atraumatic.     Right Ear: External ear normal.     Left Ear: External ear normal.     Nose: Nose normal.     Mouth/Throat:     Pharynx: No oropharyngeal exudate.  Eyes:     General: No scleral icterus.       Right eye: No discharge.        Left eye: No discharge.     Conjunctiva/sclera: Conjunctivae normal.     Pupils: Pupils are equal, round, and reactive to light.  Neck:     Musculoskeletal: Normal range of motion and neck supple.     Thyroid: No thyromegaly.  Cardiovascular:     Rate and Rhythm: Normal rate and regular rhythm.     Heart sounds: Normal heart sounds. No murmur.  Pulmonary:     Effort:  Pulmonary effort is normal. No respiratory distress.     Breath sounds: Normal breath sounds. No wheezing or rales.  Abdominal:     General: Bowel sounds are normal. There is no distension.     Palpations: Abdomen is soft. There is no mass.     Tenderness: There is  no abdominal tenderness.  Musculoskeletal: Normal range of motion.        General: No tenderness.  Lymphadenopathy:     Cervical: No cervical adenopathy.  Skin:    General: Skin is warm and dry.     Findings: No rash.  Neurological:     Mental Status: She is alert and oriented to person, place, and time.     Cranial Nerves: No cranial nerve deficit.     Coordination: Coordination normal.     Deep Tendon Reflexes: Reflexes are normal and symmetric. Reflexes normal.  Psychiatric:        Behavior: Behavior normal.     BP (!) 126/94 (BP Location: Left Arm, Patient Position: Sitting, Cuff Size: Normal)   Pulse 79   Temp 98.6 F (37 C) (Oral)   Resp 18   Ht 5\' 4"  (1.626 m)   Wt 227 lb 6.4 oz (103.1 kg)   SpO2 97%   BMI 39.03 kg/m  Wt Readings from Last 3 Encounters:  08/16/18 227 lb 6.4 oz (103.1 kg)  09/18/15 200 lb 6 oz (90.9 kg)  05/11/15 188 lb (85.3 kg)    Diabetic Foot Exam - Simple   No data filed     Lab Results  Component Value Date   WBC 6.7 08/16/2018   HGB 14.4 08/16/2018   HCT 43.8 08/16/2018   PLT 258.0 08/16/2018   GLUCOSE 103 (H) 08/16/2018   CHOL 241 (H) 08/16/2018   TRIG 144.0 08/16/2018   HDL 52.20 08/16/2018   LDLCALC 160 (H) 08/16/2018   ALT 101 (H) 08/16/2018   AST 60 (H) 08/16/2018   NA 137 08/16/2018   K 4.5 08/16/2018   CL 102 08/16/2018   CREATININE 1.05 08/16/2018   BUN 20 08/16/2018   CO2 27 08/16/2018   TSH 1.18 08/16/2018    Lab Results  Component Value Date   TSH 1.18 08/16/2018   Lab Results  Component Value Date   WBC 6.7 08/16/2018   HGB 14.4 08/16/2018   HCT 43.8 08/16/2018   MCV 86.0 08/16/2018   PLT 258.0 08/16/2018   Lab Results  Component Value  Date   NA 137 08/16/2018   K 4.5 08/16/2018   CO2 27 08/16/2018   GLUCOSE 103 (H) 08/16/2018   BUN 20 08/16/2018   CREATININE 1.05 08/16/2018   BILITOT 0.5 08/16/2018   ALKPHOS 128 (H) 08/16/2018   AST 60 (H) 08/16/2018   ALT 101 (H) 08/16/2018   PROT 7.7 08/16/2018   ALBUMIN 4.5 08/16/2018   CALCIUM 9.9 08/16/2018   GFR 53.81 (L) 08/16/2018   Lab Results  Component Value Date   CHOL 241 (H) 08/16/2018   Lab Results  Component Value Date   HDL 52.20 08/16/2018   Lab Results  Component Value Date   LDLCALC 160 (H) 08/16/2018   Lab Results  Component Value Date   TRIG 144.0 08/16/2018   Lab Results  Component Value Date   CHOLHDL 5 08/16/2018   No results found for: HGBA1C     Assessment & Plan:   Problem List Items Addressed This Visit    Anxiety    She notes significant anxiety and she acknowledges much of it centers around the anxiety of getting ill and somatatization. She feels she is doing better now.      Relevant Medications   FLUoxetine (PROZAC) 20 MG capsule   Hyperlipidemia - Primary    Encouraged heart healthy diet, increase exercise, avoid trans fats, consider a krill  oil cap daily      Relevant Orders   Lipid panel (Completed)   Multiple sclerosis (HCC)    Was diagnosed with this years ago after an episode of numbness on her face but has had no further episodes for many years.      Renal insufficiency    Hydrate and monitor is mild      Benign essential HTN    Encouraged to monitor vitals weekly, no changes to meds. Encouraged heart healthy diet such as the DASH diet and exercise as tolerated.       Relevant Orders   CBC (Completed)   Comprehensive metabolic panel (Completed)   TSH (Completed)   Low vitamin D level    Labs reveal deficiency. Start on Vitamin D 0454050000 IU caps, 1 cap po weekly x 12 weeks. Disp #4 with 4 rf. Also take daily Vitamin D over the counter. If already taking a daily supplement increase by 1000 IU daily and if  not start Vitamin D 2000 IU daily.       Elevated PTHrP level    Normal calcium, low vitamin D and asymptomatic, will monitor.        Other Visit Diagnoses    Breast cancer screening       Relevant Orders   MM 3D SCREEN BREAST BILATERAL   Hypercalcemia       Relevant Orders   VITAMIN D 25 Hydroxy (Vit-D Deficiency, Fractures) (Completed)   PTH, intact (no Ca) (Completed)   Morbid obesity (HCC)       Relevant Orders   Amb Ref to Medical Weight Management   Abdominal pain, unspecified abdominal location       Relevant Orders   Amylase (Completed)   Lipase (Completed)   Need for shingles vaccine       Relevant Orders   Varicella-zoster vaccine IM (Shingrix) (Completed)      I have discontinued Shakendra S. Osmanovic's traZODone, metoprolol succinate, and escitalopram. I am also having her maintain her cloNIDine, LORazepam, zolpidem, and FLUoxetine.  No orders of the defined types were placed in this encounter.    Danise EdgeStacey Kenyetta Fife, MD

## 2018-08-18 NOTE — Assessment & Plan Note (Signed)
Labs reveal deficiency. Start on Vitamin D 50000 IU caps, 1 cap po weekly x 12 weeks. Disp #4 with 4 rf. Also take daily Vitamin D over the counter. If already taking a daily supplement increase by 1000 IU daily and if not start Vitamin D 2000 IU daily.  

## 2018-08-18 NOTE — Assessment & Plan Note (Signed)
Encouraged heart healthy diet, increase exercise, avoid trans fats, consider a krill oil cap daily 

## 2018-08-19 ENCOUNTER — Encounter: Payer: Self-pay | Admitting: Family Medicine

## 2018-08-20 ENCOUNTER — Other Ambulatory Visit: Payer: Self-pay | Admitting: *Deleted

## 2018-08-20 ENCOUNTER — Telehealth: Payer: Self-pay

## 2018-08-20 DIAGNOSIS — R945 Abnormal results of liver function studies: Secondary | ICD-10-CM

## 2018-08-20 DIAGNOSIS — R7989 Other specified abnormal findings of blood chemistry: Secondary | ICD-10-CM

## 2018-08-20 MED ORDER — VITAMIN D (ERGOCALCIFEROL) 1.25 MG (50000 UNIT) PO CAPS: 50000.0000 [IU] | ORAL_CAPSULE | ORAL | 4 refills | Status: DC

## 2018-08-20 NOTE — Telephone Encounter (Signed)
Spoke with patient on the phone about results.  Vit d sent in, orders placed, lab appointment made.

## 2018-08-20 NOTE — Telephone Encounter (Signed)
Copied from [place] [id number]. Topic: General - Inquiry >> Aug 18, 2018 10:48 AM Virl Axe D wrote: Reason for CRM: Pt stated Dr. Charlett Blake wrote some instructions for her regarding her lab results and she does not understand some of the notes. She would like a callback so that she can get clarification on what Dr. Charlett Blake wants her to do. Please advise

## 2018-08-23 ENCOUNTER — Other Ambulatory Visit: Payer: Self-pay

## 2018-08-23 ENCOUNTER — Encounter (HOSPITAL_BASED_OUTPATIENT_CLINIC_OR_DEPARTMENT_OTHER): Payer: Self-pay

## 2018-08-23 ENCOUNTER — Ambulatory Visit (HOSPITAL_BASED_OUTPATIENT_CLINIC_OR_DEPARTMENT_OTHER)
Admission: RE | Admit: 2018-08-23 | Discharge: 2018-08-23 | Disposition: A | Discharge status: Home / Self Care | Payer: 59 | Source: Ambulatory Visit | Attending: Family Medicine | Admitting: Family Medicine

## 2018-08-23 DIAGNOSIS — R945 Abnormal results of liver function studies: Secondary | ICD-10-CM

## 2018-08-23 DIAGNOSIS — Z1239 Encounter for other screening for malignant neoplasm of breast: Secondary | ICD-10-CM | POA: Diagnosis present

## 2018-08-23 DIAGNOSIS — R7989 Other specified abnormal findings of blood chemistry: Secondary | ICD-10-CM

## 2018-08-26 ENCOUNTER — Ambulatory Visit (INDEPENDENT_AMBULATORY_CARE_PROVIDER_SITE_OTHER): Payer: 59 | Admitting: Family Medicine

## 2018-08-26 ENCOUNTER — Encounter (INDEPENDENT_AMBULATORY_CARE_PROVIDER_SITE_OTHER): Payer: Self-pay | Admitting: Family Medicine

## 2018-08-26 ENCOUNTER — Other Ambulatory Visit: Payer: Self-pay

## 2018-08-26 VITALS — BP 114/76 | HR 78 | Temp 97.9°F | Ht 64.0 in | Wt 223.0 lb

## 2018-08-26 DIAGNOSIS — R030 Elevated blood-pressure reading, without diagnosis of hypertension: Secondary | ICD-10-CM

## 2018-08-26 DIAGNOSIS — R5383 Other fatigue: Secondary | ICD-10-CM

## 2018-08-26 DIAGNOSIS — Z9189 Other specified personal risk factors, not elsewhere classified: Secondary | ICD-10-CM | POA: Diagnosis not present

## 2018-08-26 DIAGNOSIS — Z0289 Encounter for other administrative examinations: Secondary | ICD-10-CM

## 2018-08-26 DIAGNOSIS — Z1331 Encounter for screening for depression: Secondary | ICD-10-CM

## 2018-08-26 DIAGNOSIS — Z6838 Body mass index (BMI) 38.0-38.9, adult: Secondary | ICD-10-CM

## 2018-08-26 DIAGNOSIS — K760 Fatty (change of) liver, not elsewhere classified: Secondary | ICD-10-CM

## 2018-08-26 DIAGNOSIS — R0602 Shortness of breath: Secondary | ICD-10-CM

## 2018-08-26 DIAGNOSIS — E66812 Obesity, class 2: Secondary | ICD-10-CM

## 2018-08-26 NOTE — Telephone Encounter (Signed)
Please advise 

## 2018-08-27 LAB — LIPID PANEL WITH LDL/HDL RATIO
Cholesterol, Total: 245 mg/dL — ABNORMAL HIGH (ref 100–199)
HDL: 48 mg/dL (ref >39)
LDL Calculated: 167 mg/dL — ABNORMAL HIGH (ref 0–99)
LDl/HDL Ratio: 3.5 ratio — ABNORMAL HIGH (ref 0.0–3.2)
Triglycerides: 151 mg/dL — ABNORMAL HIGH (ref 0–149)
VLDL Cholesterol Cal: 30 mg/dL (ref 5–40)

## 2018-08-27 LAB — HEMOGLOBIN A1C
Est. average glucose Bld gHb Est-mCnc: 123 mg/dL
Hgb A1c MFr Bld: 5.9 % — ABNORMAL HIGH (ref 4.8–5.6)

## 2018-08-27 LAB — VITAMIN B12: Vitamin B-12: 564 pg/mL (ref 232–1245)

## 2018-08-27 LAB — FOLATE: Folate: 4.7 ng/mL (ref >3.0)

## 2018-08-27 LAB — INSULIN, RANDOM: INSULIN: 27.8 u[IU]/mL — ABNORMAL HIGH (ref 2.6–24.9)

## 2018-08-30 NOTE — Progress Notes (Signed)
Office: [phone]  /  Fax: [phone]   Dear Dr. Penni Davenport,   Thank you for referring Emily Davenport to our clinic. The following note includes my evaluation and treatment recommendations.  HPI:   Chief Complaint: OBESITY    Emily Davenport has been referred by Dr. Penni Davenport for consultation regarding her obesity and obesity related comorbidities.    Emily Davenport (MR# [id number]) is a 58 y.o. female who presents on 08/26/2018 for obesity evaluation and treatment. Current BMI is Body mass index is 38.28 kg/m.Marland Kitchen Emily Davenport has been struggling with her weight for many years and has been unsuccessful in either losing weight, maintaining weight loss, or reaching her healthy weight goal.     Emily Davenport attended our information session and states she is currently in the action stage of change and ready to dedicate time achieving and maintaining a healthier weight. Emily Davenport is interested in becoming our patient and working on intensive lifestyle modifications including (but not limited to) diet, exercise and weight loss.      Emily Davenport states upon waking up she has a [place] chicken biscuit with cheese and pickles and a diet soda (eats all of this and feels full) around 11 a.m. She occasionally snacks but often waits for dinner to eat again. For dinner she will have a 3-ounce burger with cheese, mayo, mustard, ketchup, and a 1/2 cup of baked beans and feels full. Will occasionally have a spoonful of peanut butter.    [name] states her family eats meals together she thinks her family will eat healthier with  her her desired weight loss is 73 lbs she has been heavy since the birth of her first child she started gaining excessive weight after retirement her heaviest weight ever was 230 lbs. she snacks occasionally in the evenings she skips breakfast daily  she frequently makes poor food choices she frequently eats larger portions than normal  she has binge eating behaviors she struggles with  emotional eating    Emily Davenport feels her energy is lower than it should be. This has worsened with weight gain and has worsened recently. Emily Davenport admits to daytime somnolence and admits to waking up still tired. Patient is at risk for obstructive sleep apnea. Patent has a history of symptoms of daytime Emily and morning headache. Patient generally gets 8 hours of sleep per night, and states they generally have restful sleep only because they use sleep meds. Snoring is present. Apneic episodes are not present. Epworth Sleepiness Score is 6.  Dyspnea on exertion [name] notes increasing shortness of breath with exercising and seems to be worsening over time with weight gain. She notes getting out of breath sooner with activity than she used to. This has gotten worse recently. Emily Davenport's ECG showed normal sinus rhythm at 80 BPM. Emily Davenport denies orthopnea.  Elevated Blood Pressure Emily Davenport is a 58 y.o. female with hypertension.  Emily Davenport denies chest pain, chest pressure, or headacher. She is working weight loss to help control her blood pressure with the goal of decreasing her risk of heart attack and stroke. [name]'s blood pressure is well controlled today.  At risk for cardiovascular disease Emily Davenport is at a higher than average risk for cardiovascular disease due to obesity. She currently denies any chest pain.  Fatty Liver Disease Emily Davenport had an abdominal ultrasound on 08/03 showing steatosis with no focal abnormality. She had LFT's on 08/16/2018,  Depression Screen Emily Davenport's Food and Mood (modified PHQ-9) score was 20. Depression screen PHQ  2/9 08/26/2018  Decreased Interest 3  Down, Depressed, Hopeless 3  PHQ - 2 Score 6  Altered sleeping 3  Tired, decreased energy 3  Change in appetite 1  Feeling bad or failure about yourself  3  Trouble concentrating 1  Moving slowly or fidgety/restless 3  Suicidal thoughts 0  PHQ-9 Score 20  Difficult doing work/chores Somewhat difficult    ASSESSMENT AND PLAN:  Other Emily - Plan: EKG 12-Lead, Hemoglobin A1c, Insulin, random, Vitamin B12, Folate  Shortness of breath on exertion - Plan: Lipid Panel With LDL/HDL Ratio  Elevated BP without diagnosis of hypertension  Fatty liver  Depression screening  At risk for heart disease  Class 2 severe obesity with serious comorbidity and body mass index (BMI) of 38.0 to 38.9 in adult, unspecified obesity type (HCC)  PLAN:  Emily Emily Davenport was informed that her Emily may be related to obesity, depression or many other causes. Labs will be ordered, and in the meanwhile Emily Davenport has agreed to work on diet, exercise and weight loss to help with Emily. Proper sleep hygiene was discussed including the need for 7-8 hours of quality sleep each night. A sleep study was not ordered based on symptoms and Epworth score. Will have EKG and IC performed at today's visit.  Dyspnea on exertion Emily Davenport's shortness of breath appears to be obesity related and exercise induced. She has agreed to work on weight loss and gradually increase exercise to treat her exercise induced shortness of breath. If [name] follows our instructions and loses weight without improvement of her shortness of breath, we will plan to refer to pulmonology. We will monitor this condition regularly. Emily Davenport agrees to this plan.  Elevated Blood Pressure We discussed sodium restriction, working on healthy weight loss, and a regular exercise program as the means to achieve improved blood pressure control. Emily Davenport agreed with this plan and agreed to follow up as directed. We will continue to monitor her blood pressure as well as her progress with the above lifestyle modifications. She will watch for signs of hypotension as she continues her lifestyle modifications and we will follow-up at her next appointment.  Cardiovascular risk counseling Emily Davenport was given extended (15 minutes) coronary artery disease prevention counseling today. She is 58  y.o. female and has risk factors for heart disease including obesity. We discussed intensive lifestyle modifications today with an emphasis on specific weight loss instructions and strategies. Pt was also informed of the importance of increasing exercise and decreasing saturated fats to help prevent heart disease.  Fatty Liver Disease Emily Davenport will have repeat CMP in 3 months.  Depression Screen Emily Davenport had a strongly positive depression screening. Depression is commonly associated with obesity and often results in emotional eating behaviors. We will monitor this closely and work on CBT to help improve the non-hunger eating patterns. Referral to Psychology may be required if no improvement is seen as she continues in our clinic.  Obesity Emily Davenport is currently in the action stage of change and her goal is to continue with weight loss efforts. I recommend Emily Davenport begin the structured treatment plan as follows:  She has agreed to follow the category 2 plan.  Emily Davenport has been instructed to eventually work up to a goal of 150 minutes of combined cardio and strengthening exercise per week for weight loss and overall health benefits. We discussed the following Behavioral Modification Strategies today: increasing lean protein intake, increasing vegetables, work on meal planning and easy cooking plans, keeping healthy foods in the home, and  planning for success.   She was informed of the importance of frequent follow-up visits to maximize her success with intensive lifestyle modifications for her multiple health conditions. She was informed we would discuss her lab results at her next visit unless there is a critical issue that needs to be addressed sooner. Emily Davenport agreed to keep her next visit at the agreed upon time to discuss these results.  ALLERGIES: Allergies  Allergen Reactions  . Augmentin [Amoxicillin-Pot Clavulanate]     diarrhea    MEDICATIONS: Current Outpatient Medications on File Prior to Visit   Medication Sig Dispense Refill  . Cholecalciferol (VITAMIN D3) 50 MCG (2000 UT) TABS Take by mouth 1 day or 1 dose. 1 per day for 6 days    . cloNIDine (CATAPRES) 0.2 MG tablet Take 0.2 mg by mouth 3 (three) times daily.     . Ferrous Sulfate (IRON SLOW RELEASE) 140 (45 Fe) MG TBCR Take by mouth.    . fexofenadine (ALLEGRA) 180 MG tablet Take 180 mg by mouth daily.    Marland Kitchen. FLUoxetine (PROZAC) 20 MG capsule 2 (two) times daily.     Marland Kitchen. LORazepam (ATIVAN) 1 MG tablet Take 1 tablet (1 mg total) by mouth 3 (three) times daily as needed for anxiety. (Patient taking differently: Take 1 mg by mouth 2 (two) times daily. ) 40 tablet 1  . Vitamin D, Ergocalciferol, (DRISDOL) 1.25 MG (50000 UT) CAPS capsule Take 1 capsule (50,000 Units total) by mouth every 7 (seven) days. 4 capsule 4  . zolpidem (AMBIEN) 10 MG tablet Take 10 mg by mouth at bedtime as needed for sleep.     No current facility-administered medications on file prior to visit.     PAST MEDICAL HISTORY: Past Medical History:  Diagnosis Date  . Allergies   . Anxiety   . Arthritis   . Benign essential HTN 09/30/2015  . Bilateral swelling of feet   . Borderline hypertension   . Depression   . Fatty liver   . GERD (gastroesophageal reflux disease)   . H/O sinusitis 05/11/2015  . High serum parathyroid hormone (PTH) 08/18/2018  . History of chicken pox 05/11/2015  . History of chicken pox 05/11/2015  . Hx of endometriosis 03/28/2013  . Hyperlipidemia   . IBS (irritable bowel syndrome)   . Insomnia   . Kidney problem   . Knee pain   . Multiple sclerosis (HCC)   . PKD (polycystic kidney disease)   . Renal insufficiency 12/20/2013  . Seasonal allergies 05/11/2015  . Tear of lateral meniscus of right knee   . Vitamin D deficiency     PAST SURGICAL HISTORY: Past Surgical History:  Procedure Laterality Date  . BLADDER SURGERY  2009   sling  . COLONOSCOPY    . Finger Joint Surgery    . KNEE ARTHROSCOPY WITH MEDIAL MENISECTOMY Right  08/15/2013   Procedure: RIGHT KNEE ARTHROSCOPY WITH DEBRIDEMENT/SHAVING (CHRONDRPLASTY), MEDIAL AND LATERAL MENISECTOMY;  Surgeon: Nilda Simmerobert A Wainer, MD;  Location: [facility];  Service: Orthopedics;  Laterality: Right;  . NASAL SINUS SURGERY  2000  . planterfaciitis  1998   right  . RHINOPLASTY  1979  . UPPER GI ENDOSCOPY    . VAGINAL HYSTERECTOMY  2010  . WISDOM TOOTH EXTRACTION      SOCIAL HISTORY: Social History   Tobacco Use  . Smoking status: Never Smoker  . Smokeless tobacco: Never Used  Substance Use Topics  . Alcohol use: Yes    Comment: ocasionally  .  Drug use: No    FAMILY HISTORY: Family History  Problem Relation Age of Onset  . Kidney disease Mother   . Osteoporosis Mother   . COPD Mother   . Hypertension Mother   . Hyperlipidemia Mother   . Obesity Mother   . Hypertension Father   . Cancer Father        throat  . Kidney disease Father        kidney stones and cancer  . Alzheimer's disease Father   . Hyperlipidemia Father   . Hypertension Sister   . Prostate cancer Maternal Grandfather   . Alzheimer's disease Paternal Grandmother   . Drug abuse Paternal Grandmother   . Heart disease Paternal Grandfather   . Drug abuse Paternal Grandfather    ROS: Review of Systems  HENT: Positive for sinus pain.        Positive for nose stuffiness (seasonal allergies).  Eyes:       Positive for wearing glasses or contacts.  Cardiovascular: Negative for chest pain.       Negative for chest pressure.  Neurological: Positive for headaches (sinus, allergy).  Psychiatric/Behavioral: The patient has insomnia (trouble sleeping).    PHYSICAL EXAM: Blood pressure 114/76, pulse 78, temperature 97.9 F (36.6 C), temperature source Oral, height 5\' 4"  (1.626 m), weight 223 lb (101.2 kg), SpO2 96 %. Body mass index is 38.28 kg/m. Physical Exam Vitals signs reviewed.  Constitutional:      Appearance: Normal appearance. She is well-developed. She is obese.   HENT:     Head: Normocephalic and atraumatic.     Nose: Nose normal.  Eyes:     General: No scleral icterus. Neck:     Musculoskeletal: Normal range of motion.  Cardiovascular:     Rate and Rhythm: Normal rate and regular rhythm.  Pulmonary:     Effort: Pulmonary effort is normal. No respiratory distress.  Abdominal:     Palpations: Abdomen is soft.     Tenderness: There is no abdominal tenderness.  Musculoskeletal: Normal range of motion.     Comments: Range of motion normal in all four extremities.  Skin:    General: Skin is warm and dry.  Neurological:     Mental Status: She is alert and oriented to person, place, and time.     Coordination: Coordination normal.  Psychiatric:        Mood and Affect: Mood and affect normal.        Behavior: Behavior normal.   RECENT LABS AND TESTS: BMET    Component Value Date/Time   NA 137 08/16/2018 1512   NA 141 10/04/2012 1448   K 4.5 08/16/2018 1512   K 3.8 10/04/2012 1448   CL 102 08/16/2018 1512   CL 101 10/04/2012 1448   CO2 27 08/16/2018 1512   CO2 30 10/04/2012 1448   GLUCOSE 103 (H) 08/16/2018 1512   GLUCOSE 93 10/04/2012 1448   BUN 20 08/16/2018 1512   BUN 14 10/04/2012 1448   CREATININE 1.05 08/16/2018 1512   CREATININE 0.84 05/12/2014 1143   CALCIUM 9.9 08/16/2018 1512   CALCIUM 9.7 10/04/2012 1448   GFRNONAA 61 12/19/2013 1218   GFRAA 70 12/19/2013 1218   Lab Results  Component Value Date   HGBA1C 5.9 (H) 08/26/2018   Lab Results  Component Value Date   INSULIN 27.8 (H) 08/26/2018   CBC    Component Value Date/Time   WBC 6.7 08/16/2018 1512   RBC 5.09 08/16/2018 1512   HGB  14.4 08/16/2018 1512   HGB 13.4 10/04/2012 1448   HCT 43.8 08/16/2018 1512   HCT 40.0 10/04/2012 1448   PLT 258.0 08/16/2018 1512   PLT 235 10/04/2012 1448   MCV 86.0 08/16/2018 1512   MCV 88 10/04/2012 1448   MCH 28.1 12/19/2013 1218   MCHC 33.0 08/16/2018 1512   RDW 13.5 08/16/2018 1512   RDW 13.1 10/04/2012 1448    LYMPHSABS 2.2 12/19/2013 1218   LYMPHSABS 2.8 10/04/2012 1448   MONOABS 0.7 12/19/2013 1218   EOSABS 0.1 12/19/2013 1218   EOSABS 0.1 10/04/2012 1448   BASOSABS 0.0 12/19/2013 1218   BASOSABS 0.0 10/04/2012 1448   Iron/TIBC/Ferritin/ %Sat No results found for: IRON, TIBC, FERRITIN, IRONPCTSAT Lipid Panel     Component Value Date/Time   CHOL 245 (H) 08/26/2018 1021   TRIG 151 (H) 08/26/2018 1021   HDL 48 08/26/2018 1021   CHOLHDL 5 08/16/2018 1512   VLDL 28.8 08/16/2018 1512   LDLCALC 167 (H) 08/26/2018 1021   Hepatic Function Panel     Component Value Date/Time   PROT 7.7 08/16/2018 1512   PROT 7.7 10/04/2012 1448   ALBUMIN 4.5 08/16/2018 1512   ALBUMIN 3.9 10/04/2012 1448   AST 60 (H) 08/16/2018 1512   AST 19 10/04/2012 1448   ALT 101 (H) 08/16/2018 1512   ALT 15 10/04/2012 1448   ALKPHOS 128 (H) 08/16/2018 1512   ALKPHOS 104 (H) 10/04/2012 1448   BILITOT 0.5 08/16/2018 1512   BILITOT 0.70 10/04/2012 1448      Component Value Date/Time   TSH 1.18 08/16/2018 1512   TSH 0.702 12/19/2013 1218   Results for BROOKELYNN, HAMOR (MRN [id number]) as of 08/30/2018 08:07  Ref. Range 08/16/2018 15:12  VITD Latest Ref Range: 30.00 - 100.00 ng/mL 10.25 (L)    ECG shows sinus rhythm with a rate of 80 BPM, consider old anterior infarct, abnormal.   INDIRECT CALORIMETER done today shows a VO2 of 209 and a REE of 1452.  Her calculated basal metabolic rate is 0865 thus her basal metabolic rate is worse than expected.   OBESITY BEHAVIORAL INTERVENTION VISIT  Today's visit was #1   Starting weight: 223 lbs Starting date: 08/26/2018 Today's weight: 223 lbs  Today's date: 08/26/2018 Total lbs lost to date: 0    08/26/2018  Height  (1.626 m)  Weight 223 lb (101.2 kg)  BMI (Calculated) 38.26  BLOOD PRESSURE - SYSTOLIC 114  BLOOD PRESSURE - DIASTOLIC 76  Waist Measurement  44.5 inches   Body Fat % 47.2 %  Total Body Water (lbs) 79.8 lbs  RMR 1452   ASK: We discussed the  diagnosis of obesity with Idolina Primer today and Kannon agreed to give [place] permission to discuss obesity behavioral modification therapy today.  ASSESS: Jackqueline has the diagnosis of obesity and her BMI today is 38.4. Anjelita is in the action stage of change.   ADVISE: Orpah was educated on the multiple health risks of obesity as well as the benefit of weight loss to improve her health. She was advised of the need for long term treatment and the importance of lifestyle modifications to improve her current health and to decrease her risk of future health problems.  AGREE: Multiple dietary modification options and treatment options were discussed and  Lylian agreed to follow the recommendations documented in the above note.  ARRANGE: Amey was educated on the importance of frequent visits to treat obesity as outlined per CMS and USPSTF guidelines  and agreed to schedule her next follow up appointment today.  I, Marianna Payment, am acting as transcriptionist for Debbra Riding, MD  I have reviewed the above documentation for accuracy and completeness, and I agree with the above. - Debbra Riding, MD

## 2018-08-31 ENCOUNTER — Telehealth: Payer: Self-pay

## 2018-08-31 DIAGNOSIS — N200 Calculus of kidney: Secondary | ICD-10-CM

## 2018-08-31 DIAGNOSIS — N289 Disorder of kidney and ureter, unspecified: Secondary | ICD-10-CM

## 2018-08-31 NOTE — Telephone Encounter (Signed)
Spoke with patient about her [place] she agreed to see a urologist.

## 2018-09-01 ENCOUNTER — Encounter (INDEPENDENT_AMBULATORY_CARE_PROVIDER_SITE_OTHER): Payer: Self-pay | Admitting: Family Medicine

## 2018-09-01 NOTE — Progress Notes (Addendum)
Office: [phone]  /  Fax: [phone]    Date: September 06, 2018   Appointment Start Time: 12:00pm Duration: 45 minutes Provider: Glennie Isle, Psy.D. Type of Session: Intake for Individual Therapy  Location of Patient: Home Location of Provider: [facility] Type of Contact: Telepsychological Visit via [employer] WebEx  Informed Consent: Prior to proceeding with today's appointment, two pieces of identifying information were obtained from [place] to verify identity. In addition, Aisling's physical location at the time of this appointment was obtained. Nayelly reported she was at home and provided the address. In the event of technical difficulties, Sherrol shared a phone number she could be reached at. Nancey and this provider participated in today's telepsychological service. Also, Jodi denied anyone else being on the WebEx appointment. Notably, Pinky's husband frequently walked in and out of the room during the appointment. Prior to initiating the appointment, confidentiality limitations were discussed. She acknowledged understanding and noted she would inform this provider should there be a concern about her husband being present.   The provider's role was explained to [employer]. The provider reviewed and discussed issues of confidentiality, privacy, and limits therein (e.g., reporting obligations). In addition to verbal informed consent, written informed consent for psychological services was obtained from [place] prior to the initial intake interview. Written consent included information concerning the practice, financial arrangements, and confidentiality and patients' rights. Since the clinic is not a 24/7 crisis center, mental health emergency resources were shared, and the provider explained MyChart, e-mail, voicemail, and/or other messaging systems should be utilized only for non-emergency reasons. This provider also explained that information obtained during appointments  will be placed in Vernecia's medical record in a confidential manner and relevant information will be shared with other providers at [facility] that she meets with for coordination of care. Davi verbally acknowledged understanding of the aforementioned, and agreed to use mental health emergency resources discussed if needed. Moreover, AmeLie agreed information may be shared with other [facility] providers as needed for coordination of care. By signing the service agreement document, Jani provided written consent for coordination of care.   Prior to initiating telepsychological services, Antonina was provided with an informed consent document, which included the development of a safety plan (i.e., an emergency contact and emergency resources) in the event of an emergency/crisis. Azalea expressed understanding of the rationale of the safety plan and provided consent for this provider to reach out to her emergency contact in the event of an emergency/crisis. Chey returned the completed consent form prior to today's appointment. This provider verbally reviewed the consent form during today's appointment prior to proceeding with the appointment. Megyn verbally acknowledged understanding that she is ultimately responsible for understanding her insurance benefits as it relates to reimbursement of telepsychological and in-person services. This provider also reviewed confidentiality, as it relates to telepsychological services, as well as the rationale for telepsychological services. More specifically, this provider's clinic is limiting in-person visits due to COVID-19. Therapeutic services will resume to in-person appointments once deemed appropriate. Ameliarose expressed understanding regarding the rationale for telepsychological services. In addition, this provider explained the telepsychological services informed consent document would be considered an addendum to the initial consent  document/service agreement. Stana verbally consented to proceed.   Chief Complaint/HPI: Trinita was referred by Dr. Ilene Qua. During the initial appointment with Dr. Ilene Qua at [facility] on August 26, 2018, Allayah reported experiencing the following: frequently making poor food choices, frequently eating  larger portions than normal , binge eating behaviors, struggling with emotional eating, snacking occassionally in the evenings and skipping breakfast daily.   During today's appointment, Dilynn was verbally administered a questionnaire assessing various behaviors related to emotional eating. Soma endorsed the following: find food is comforting to you, overeat when you are worried about something, not worry about what you eat when you are in a good mood, overeat when you are alone, but eat much less when you are with other people and eat as a reward. She shared she craves peanut butter and fast food sometimes. Sehaj indicated, "I'm not convinced I have emotional eating. I sometimes eat what I'm not supposed to." She explained when she is "terribly anxious" she does not eat. She also described skipping meals at times and added, "Breakfast being number one." In addition, Sherrita denied a history of binge eating. Recie denied a history of restricting food intake, purging and engagement in other compensatory strategies, and has never been diagnosed with an eating disorder. She also denied a history of treatment for emotional eating. Moreover, Jakiera indicated boredom and feeling "super good" triggers emotional eating. Furthermore, Maylynn shared she is in the process of moving and described it as "overwhelming."   Mental Status Examination:  Appearance: neat Behavior: cooperative Mood: euthymic Affect: mood congruent Speech: normal in rate, volume, and tone Eye Contact: appropriate Psychomotor Activity: appropriate Thought Process: linear, logical, and goal directed    Content/Perceptual Disturbances: denies suicidal and homicidal ideation, plan, and intent and no hallucinations, delusions, bizarre thinking or behavior reported or observed Orientation: time, person, place and purpose of appointment Cognition/Sensorium: memory, attention, language, and fund of knowledge intact  Insight: good Judgment: good  Family & Psychosocial History: Tekeshia reported she is married and she has two adult children. She indicated she is currently employed part-time with the [employer]. Additionally, Bitania shared her highest level of education obtained is a bachelor's degree. Currently, Daphane's social support system consists of her friends, husband, co-workers, and children. Moreover, Cherine stated she resides with her husband and two dogs.   Medical History:  Past Medical History:  Diagnosis Date   Allergies    Anxiety    Arthritis    Benign essential HTN 09/30/2015   Bilateral swelling of feet    Borderline hypertension    Depression    Fatty liver    GERD (gastroesophageal reflux disease)    H/O sinusitis 05/11/2015   High serum parathyroid hormone (PTH) 08/18/2018   History of chicken pox 05/11/2015   History of chicken pox 05/11/2015   Hx of endometriosis 03/28/2013   Hyperlipidemia    IBS (irritable bowel syndrome)    Insomnia    Kidney problem    Knee pain    Multiple sclerosis (HCC)    PKD (polycystic kidney disease)    Renal insufficiency 12/20/2013   Seasonal allergies 05/11/2015   Tear of lateral meniscus of right knee    Vitamin D deficiency    Past Surgical History:  Procedure Laterality Date   BLADDER SURGERY  2009   sling   COLONOSCOPY     Finger Joint Surgery     KNEE ARTHROSCOPY WITH MEDIAL MENISECTOMY Right 08/15/2013   Procedure: RIGHT KNEE ARTHROSCOPY WITH DEBRIDEMENT/SHAVING (CHRONDRPLASTY), MEDIAL AND LATERAL MENISECTOMY;  Surgeon: Lorn Junes, MD;  Location: [facility];  Service:  Orthopedics;  Laterality: Right;   NASAL SINUS SURGERY  2000   planterfaciitis  1998   right   RHINOPLASTY  1979  UPPER GI ENDOSCOPY     VAGINAL HYSTERECTOMY  2010   WISDOM TOOTH EXTRACTION     Current Outpatient Medications on File Prior to Visit  Medication Sig Dispense Refill   Cholecalciferol (VITAMIN D3) 50 MCG (2000 UT) TABS Take by mouth 1 day or 1 dose. 1 per day for 6 days     cloNIDine (CATAPRES) 0.2 MG tablet Take 0.2 mg by mouth 3 (three) times daily.      Ferrous Sulfate (IRON SLOW RELEASE) 140 (45 Fe) MG TBCR Take by mouth.     fexofenadine (ALLEGRA) 180 MG tablet Take 180 mg by mouth daily.     FLUoxetine (PROZAC) 20 MG capsule 2 (two) times daily.      LORazepam (ATIVAN) 1 MG tablet Take 1 tablet (1 mg total) by mouth 3 (three) times daily as needed for anxiety. (Patient taking differently: Take 1 mg by mouth 2 (two) times daily. ) 40 tablet 1   Vitamin D, Ergocalciferol, (DRISDOL) 1.25 MG (50000 UT) CAPS capsule Take 1 capsule (50,000 Units total) by mouth every 7 (seven) days. 4 capsule 4   zolpidem (AMBIEN) 10 MG tablet Take 10 mg by mouth at bedtime as needed for sleep.     No current facility-administered medications on file prior to visit.   Patriciaann denied a history of head injuries and loss of consciousness.   Mental Health History: Reese first attended therapeutic services around 1990 for anxiety and depression secondary to a "medical situation." She attended services for approximately one year. In 2012, Bana met with a therapist due to her son's health, which "ramped up" her anxiety. She also retired 6 months later and had health concerns about her daughter and herself. Due to an increase in anxiety, her therapist referred her to another provider whom Eboni met with for "awhile." Due to the therapist's schedule, Toya initiated services with another therapist. She met with that therapist for 3-4 appointments as she was retiring. Thus, she transferred to  another clinician whom she saw for 3.5 years. Tylicia denied a history of hospitalizations for psychiatric concerns. She stated she currently has a psychiatrist, Dr. Toy Care. Ghina meets with Dr. Toy Care for medication management and their next appointment is September 30, 2018. Dr. Toy Care currently prescribes Ambien, Ativan, Prozac, and Clonidine. Johany endorsed a family history of mental health related concerns. More specifically, Avneet stated, "My mother probably had anxiety and I have a [maternal] aunt who does too." She indicated her paternal cousin is diagnosed with Bipolar Disorder. Minetta denied a trauma history, including psychological, physical  and sexual abuse, as well as neglect.   Jerah described her typical mood as "good." Aside from concerns noted above and endorsed on the PHQ-9 and GAD-7, Roshaunda reported feeling "ashamed" about her self-image and experiences racing thoughts. She further shared that when she is overwhelmed, she "tend[s] to see the mountain as too high and [she] shut[s] down." Beola endorsed "rare" alcohol use. She denied tobacco use. She denied illicit/recreational substance use. Regarding caffeine intake, Prairie reported she was previously consuming one 16 oz diet soda "four, five out of 7 days." Furthermore, Emali denied experiencing the following: hopelessness, hallucinations and delusions, paranoia, mania, social withdrawal, crying spells, panic attacks and decreased motivation. Of note, she endorsed a history of panic attacks. She also denied history of and current suicidal ideation, plan, and intent; history of and current homicidal ideation, plan, and intent; and history of and current engagement in self-harm.  The following strengths were reported by Nikaela:  communication skills, loyalty, honesty, and kindness. The following strengths were observed by this provider: ability to express thoughts and feelings during the therapeutic session, ability to establish and benefit from a  therapeutic relationship, ability to learn and practice coping skills, willingness to work toward established goal(s) with the clinic and ability to engage in reciprocal conversation.  Legal History: Lucila denied a history of legal involvement.   Structured Assessment Results: The Patient Health Questionnaire-9 (PHQ-9) is a self-report measure that assesses symptoms and severity of depression over the course of the last two weeks. Meleana obtained a score of 9 suggesting mild depression. Farah finds the endorsed symptoms to be not difficult at all. [name] 0  Feeling down, depressed, or hopeless 0  Trouble falling or staying asleep, or sleeping too much 2  Feeling tired or having little energy 2  Poor appetite or overeating 3  Feeling bad about yourself --- or that you are a failure or have let yourself or your family down 2  Trouble concentrating on things, such as reading the newspaper or watching television 0  Moving or speaking so slowly that other people could have noticed? Or the opposite --- being so fidgety or restless that you have been moving around a lot more than usual 0  Thoughts that you would be better off dead or hurting yourself in some way 0  PHQ-9 Score 9    The Generalized Anxiety Disorder-7 (GAD-7) is a brief self-report measure that assesses symptoms of anxiety over the course of the last two weeks. Rorie obtained a score of 4 suggesting minimal anxiety. Debany finds the endorsed symptoms to be not difficult at all. Feeling nervous, anxious, on edge 3  Not being able to stop or control worrying 0  Worrying too much about different things 0  Trouble relaxing 0  Being so restless that it's hard to sit still 0  Becoming easily annoyed or irritable 1  Feeling afraid as if something awful might happen 0  GAD-7 Score 4   Interventions: A chart review was conducted prior to the clinical intake interview. The PHQ-9, and GAD-7 were verbally  administered as well as a Mood and Food questionnaire to assess various behaviors related to emotional eating. Throughout session, empathic reflections and validation was provided. Continuing treatment with this provider was discussed and a treatment goal was established. Psychoeducation regarding emotional versus physical hunger was provided. Sieanna was sent a handout via e-mail to utilize between now and the next appointment to increase awareness of hunger patterns and subsequent eating. Reather provided verbal consent during today's appointment for this provider to send the handout via e-mail.   Provisional DSM-5 Diagnosis:  300.09 (F41.8) Other Specified Anxiety Disorder, Emotional Eating Behaviors  Plan: Chaniah appears able and willing to participate as evidenced by collaboration on a treatment goal, engagement in reciprocal conversation, and asking questions as needed for clarification. Per Kyann's request, the next appointment will be scheduled in 3-4 weeks, which will be via [employer]. The following treatment goal was established: increase coping skills. For the aforementioned goal, Sherree can benefit from individual therapy sessions that are brief in duration for approximately four to six sessions. The treatment modality will be individual therapeutic services, including an eclectic therapeutic approach utilizing techniques from Cognitive Behavioral Therapy, Patient Centered Therapy, Dialectical Behavior Therapy, Acceptance and Commitment Therapy, Interpersonal Therapy, and Cognitive Restructuring. Therapeutic approach will include various interventions as appropriate, such as validation, support, mindfulness, thought defusion, reframing, psychoeducation,  values assessment, and role playing. This provider will regularly review the treatment plan and medical chart to keep informed of status changes. Neena expressed understanding and agreement with the initial treatment plan of care.

## 2018-09-06 ENCOUNTER — Ambulatory Visit (INDEPENDENT_AMBULATORY_CARE_PROVIDER_SITE_OTHER): Payer: 59 | Admitting: Psychology

## 2018-09-06 ENCOUNTER — Other Ambulatory Visit: Payer: Self-pay

## 2018-09-06 DIAGNOSIS — F418 Other specified anxiety disorders: Secondary | ICD-10-CM

## 2018-09-09 ENCOUNTER — Ambulatory Visit (INDEPENDENT_AMBULATORY_CARE_PROVIDER_SITE_OTHER): Payer: 59 | Admitting: Family Medicine

## 2018-09-09 ENCOUNTER — Encounter (INDEPENDENT_AMBULATORY_CARE_PROVIDER_SITE_OTHER): Payer: Self-pay | Admitting: Family Medicine

## 2018-09-09 ENCOUNTER — Other Ambulatory Visit: Payer: Self-pay

## 2018-09-09 VITALS — BP 120/82 | HR 74 | Temp 97.5°F | Ht 64.0 in | Wt 217.0 lb

## 2018-09-09 DIAGNOSIS — E66812 Obesity, class 2: Secondary | ICD-10-CM

## 2018-09-09 DIAGNOSIS — E559 Vitamin D deficiency, unspecified: Secondary | ICD-10-CM | POA: Diagnosis not present

## 2018-09-09 DIAGNOSIS — E785 Hyperlipidemia, unspecified: Secondary | ICD-10-CM | POA: Diagnosis not present

## 2018-09-09 DIAGNOSIS — Z6837 Body mass index (BMI) 37.0-37.9, adult: Secondary | ICD-10-CM

## 2018-09-09 DIAGNOSIS — Q613 Polycystic kidney, unspecified: Secondary | ICD-10-CM

## 2018-09-09 DIAGNOSIS — Z9189 Other specified personal risk factors, not elsewhere classified: Secondary | ICD-10-CM

## 2018-09-09 DIAGNOSIS — R7303 Prediabetes: Secondary | ICD-10-CM

## 2018-09-14 NOTE — Progress Notes (Signed)
Office: [phone]  /  Fax: [phone]   HPI:   Chief Complaint: OBESITY Emily Davenport is here to discuss her progress with her obesity treatment plan. She is on the Category 2 plan and is following her eating plan approximately 95 % of the time. She states she is walking the stairs for 10-15 minutes 5 times per week. Emily Davenport is usually able to get everything in, 2 instances of skipping breakfast or combining breakfast and lunch. She is doing almonds, veggies, eggs for breakfast, and a tomato sandwich at lunch. She is doing protein and veggies at night. She denies any cravings.  Her weight is 217 lb (98.4 kg) today and has had a weight loss of 6 pounds over a period of 2 weeks since her last visit. She has lost 6 lbs since starting treatment with [place].  Pre-Diabetes Emily Davenport has a new diagnosis of pre-diabetes based on her elevated Hgb A1c and was informed this puts her at greater risk of developing diabetes. She is not taking metformin currently and continues to work on diet and exercise to decrease risk of diabetes. She denies hypoglycemia.  At risk for diabetes Emily Davenport is at higher than average risk for developing diabetes due to her obesity and pre-diabetes. She currently denies polyuria or polydipsia.  Polycystic Kidney Disease Emily Davenport has a diagnosis of polycystic kidney disease. She previously saw [employer] [facility].  Hyperlipidemia Emily Davenport has hyperlipidemia and has been trying to improve her cholesterol levels with intensive lifestyle modification including a low saturated fat diet, exercise and weight loss. Last LDL was of 167 and HDL of 48. She is not on statin and denies any chest pain, claudication or myalgias.  Vitamin D Deficiency Emily Davenport has a diagnosis of vitamin D deficiency. She is currently taking OTC Vit D 2,000 IU daily and prescription Vit D 50,000 IU weekly. Last Vit D level was of 10.25. She notes fatigue and denies nausea, vomiting or muscle weakness.  ASSESSMENT AND  PLAN:  Prediabetes  Polycystic kidney disease - Plan: Ambulatory referral to Nephrology  Hyperlipidemia, unspecified hyperlipidemia type  Vitamin D deficiency  At risk for diabetes mellitus  Class 2 severe obesity with serious comorbidity and body mass index (BMI) of 37.0 to 37.9 in adult, unspecified obesity type [facility])  PLAN:  Pre-Diabetes Emily Davenport will continue to work on weight loss, exercise, and decreasing simple carbohydrates in her diet to help decrease the risk of diabetes. We dicussed metformin including benefits and risks. She was informed that eating too many simple carbohydrates or too many calories at one sitting increases the likelihood of GI side effects. Emily Davenport declined metformin for now and a prescription was not written today. We will repeat labs in 3 months. Emily Davenport agrees to follow up with our clinic in 2 weeks as directed to monitor her progress.  Diabetes risk counseling Emily Davenport was given extended (15 minutes) diabetes prevention counseling today. She is 58 y.o. female and has risk factors for diabetes including obesity and pre-diabetes. We discussed intensive lifestyle modifications today with an emphasis on weight loss as well as increasing exercise and decreasing simple carbohydrates in her diet.  Polycystic Kidney Disease We will send a referral to [place] with Dr. Justin Mend or Dr. Hollie Salk. Emily Davenport agrees to follow up with our clinic in 2 weeks.  Hyperlipidemia Emily Davenport was informed of the American Heart Association Guidelines emphasizing intensive lifestyle modifications as the first line treatment for hyperlipidemia. We discussed many lifestyle modifications today in depth, and Emily Davenport will continue to work  on decreasing saturated fats such as fatty red meat, butter and many fried foods. She will also increase vegetables and lean protein in her diet and continue to work on exercise and weight loss efforts. We will repeat labs in 3 months. Teddy agrees to follow up  with our clinic in 2 weeks.  Vitamin D Deficiency Emily Davenport was informed that low vitamin D levels contributes to fatigue and are associated with obesity, breast, and colon cancer. Emily Davenport agrees to continue taking OTC Vit D 2,000 IU daily and prescription Vit D 50,000 IU every week. She will follow up for routine testing of vitamin D, at least 2-3 times per year. She was informed of the risk of over-replacement of vitamin D and agrees to not increase her dose unless she discusses this with us first. We will repeat labs in 3 months. Emily Davenport agrees to follow up with our clinic in 2 weeks.  Obesity Emily Davenport is currently in the action stage of change. As such, her goal is to continue with weight loss efforts She has agreed to follow the Category 2 plan Emily Davenport has been instructed to work up to a goal of 150 minutes of combined cardio and strengthening exercise per week for weight loss and overall health benefits. We discussed the following Behavioral Modification Strategies today: increasing lean protein intake, increasing vegetables and work on meal planning and easy cooking plans, keeping healthy foods in the home, better snacking choices, and planning for success   Emily Davenport has agreed to follow up with our clinic in 2 weeks. She was informed of the importance of frequent follow up visits to maximize her success with intensive lifestyle modifications for her multiple health conditions.  ALLERGIES: Allergies  Allergen Reactions   Augmentin [Amoxicillin-Pot Clavulanate]     diarrhea    MEDICATIONS: Current Outpatient Medications on File Prior to Visit  Medication Sig Dispense Refill   Cholecalciferol (VITAMIN D3) 50 MCG (2000 UT) TABS Take by mouth 1 day or 1 dose. 1 per day for 6 days     cloNIDine (CATAPRES) 0.2 MG tablet Take 0.2 mg by mouth 3 (three) times daily.      Ferrous Sulfate (IRON SLOW RELEASE) 140 (45 Fe) MG TBCR Take by mouth.     fexofenadine (ALLEGRA) 180 MG tablet Take 180 mg by mouth  daily.     FLUoxetine (PROZAC) 20 MG capsule 2 (two) times daily.      LORazepam (ATIVAN) 1 MG tablet Take 1 tablet (1 mg total) by mouth 3 (three) times daily as needed for anxiety. (Patient taking differently: Take 1 mg by mouth 2 (two) times daily. ) 40 tablet 1   Vitamin D, Ergocalciferol, (DRISDOL) 1.25 MG (50000 UT) CAPS capsule Take 1 capsule (50,000 Units total) by mouth every 7 (seven) days. 4 capsule 4   zolpidem (AMBIEN) 10 MG tablet Take 10 mg by mouth at bedtime as needed for sleep.     No current facility-administered medications on file prior to visit.     PAST MEDICAL HISTORY: Past Medical History:  Diagnosis Date   Allergies    Anxiety    Arthritis    Benign essential HTN 09/30/2015   Bilateral swelling of feet    Borderline hypertension    Depression    Fatty liver    GERD (gastroesophageal reflux disease)    H/O sinusitis 05/11/2015   High serum parathyroid hormone (PTH) 08/18/2018   History of chicken pox 05/11/2015   History of chicken pox 05/11/2015   Hx  of endometriosis 03/28/2013   Hyperlipidemia    IBS (irritable bowel syndrome)    Insomnia    Kidney problem    Knee pain    Multiple sclerosis (HCC)    PKD (polycystic kidney disease)    Renal insufficiency 12/20/2013   Seasonal allergies 05/11/2015   Tear of lateral meniscus of right knee    Vitamin D deficiency     PAST SURGICAL HISTORY: Past Surgical History:  Procedure Laterality Date   BLADDER SURGERY  2009   sling   COLONOSCOPY     Finger Joint Surgery     KNEE ARTHROSCOPY WITH MEDIAL MENISECTOMY Right 08/15/2013   Procedure: RIGHT KNEE ARTHROSCOPY WITH DEBRIDEMENT/SHAVING (CHRONDRPLASTY), MEDIAL AND LATERAL MENISECTOMY;  Surgeon: Nilda Simmerobert A Wainer, MD;  Location: [facility];  Service: Orthopedics;  Laterality: Right;   NASAL SINUS SURGERY  2000   planterfaciitis  1998   right   RHINOPLASTY  1979   UPPER GI ENDOSCOPY     VAGINAL HYSTERECTOMY   2010   WISDOM TOOTH EXTRACTION      SOCIAL HISTORY: Social History   Tobacco Use   Smoking status: Never Smoker   Smokeless tobacco: Never Used  Substance Use Topics   Alcohol use: Yes    Comment: ocasionally   Drug use: No    FAMILY HISTORY: Family History  Problem Relation Age of Onset   Kidney disease Mother    Osteoporosis Mother    COPD Mother    Hypertension Mother    Hyperlipidemia Mother    Obesity Mother    Hypertension Father    Cancer Father        throat   Kidney disease Father        kidney stones and cancer   Alzheimer's disease Father    Hyperlipidemia Father    Hypertension Sister    Prostate cancer Maternal Grandfather    Alzheimer's disease Paternal Grandmother    Drug abuse Paternal Grandmother    Heart disease Paternal Grandfather    Drug abuse Paternal Grandfather     ROS: Review of Systems  Constitutional: Positive for malaise/fatigue and weight loss.  Cardiovascular: Negative for chest pain and claudication.  Gastrointestinal: Negative for nausea and vomiting.  Genitourinary: Negative for frequency.  Musculoskeletal: Negative for myalgias.       Negative muscle weakness  Endo/Heme/Allergies: Negative for polydipsia.       Negative hypoglycemia    PHYSICAL EXAM: Blood pressure 120/82, pulse 74, temperature (!) 97.5 F (36.4 C), temperature source Oral, height 5\' 4"  (1.626 m), weight 217 lb (98.4 kg), SpO2 92 %. Body mass index is 37.25 kg/m. Physical Exam Vitals signs reviewed.  Constitutional:      Appearance: Normal appearance. She is obese.  Cardiovascular:     Rate and Rhythm: Normal rate.     Pulses: Normal pulses.  Pulmonary:     Effort: Pulmonary effort is normal.     Breath sounds: Normal breath sounds.  Musculoskeletal: Normal range of motion.  Skin:    General: Skin is warm and dry.  Neurological:     Mental Status: She is alert and oriented to person, place, and time.  Psychiatric:         Mood and Affect: Mood normal.        Behavior: Behavior normal.     RECENT LABS AND TESTS: BMET    Component Value Date/Time   NA 137 08/16/2018 1512   NA 141 10/04/2012 1448   K 4.5 08/16/2018  1512   K 3.8 10/04/2012 1448   CL 102 08/16/2018 1512   CL 101 10/04/2012 1448   CO2 27 08/16/2018 1512   CO2 30 10/04/2012 1448   GLUCOSE 103 (H) 08/16/2018 1512   GLUCOSE 93 10/04/2012 1448   BUN 20 08/16/2018 1512   BUN 14 10/04/2012 1448   CREATININE 1.05 08/16/2018 1512   CREATININE 0.84 05/12/2014 1143   CALCIUM 9.9 08/16/2018 1512   CALCIUM 9.7 10/04/2012 1448   GFRNONAA 61 12/19/2013 1218   GFRAA 70 12/19/2013 1218   Lab Results  Component Value Date   HGBA1C 5.9 (H) 08/26/2018   Lab Results  Component Value Date   INSULIN 27.8 (H) 08/26/2018   CBC    Component Value Date/Time   WBC 6.7 08/16/2018 1512   RBC 5.09 08/16/2018 1512   HGB 14.4 08/16/2018 1512   HGB 13.4 10/04/2012 1448   HCT 43.8 08/16/2018 1512   HCT 40.0 10/04/2012 1448   PLT 258.0 08/16/2018 1512   PLT 235 10/04/2012 1448   MCV 86.0 08/16/2018 1512   MCV 88 10/04/2012 1448   MCH 28.1 12/19/2013 1218   MCHC 33.0 08/16/2018 1512   RDW 13.5 08/16/2018 1512   RDW 13.1 10/04/2012 1448   LYMPHSABS 2.2 12/19/2013 1218   LYMPHSABS 2.8 10/04/2012 1448   MONOABS 0.7 12/19/2013 1218   EOSABS 0.1 12/19/2013 1218   EOSABS 0.1 10/04/2012 1448   BASOSABS 0.0 12/19/2013 1218   BASOSABS 0.0 10/04/2012 1448   Iron/TIBC/Ferritin/ %Sat No results found for: IRON, TIBC, FERRITIN, IRONPCTSAT Lipid Panel     Component Value Date/Time   CHOL 245 (H) 08/26/2018 1021   TRIG 151 (H) 08/26/2018 1021   HDL 48 08/26/2018 1021   CHOLHDL 5 08/16/2018 1512   VLDL 28.8 08/16/2018 1512   LDLCALC 167 (H) 08/26/2018 1021   Hepatic Function Panel     Component Value Date/Time   PROT 7.7 08/16/2018 1512   PROT 7.7 10/04/2012 1448   ALBUMIN 4.5 08/16/2018 1512   ALBUMIN 3.9 10/04/2012 1448   AST 60 (H) 08/16/2018  1512   AST 19 10/04/2012 1448   ALT 101 (H) 08/16/2018 1512   ALT 15 10/04/2012 1448   ALKPHOS 128 (H) 08/16/2018 1512   ALKPHOS 104 (H) 10/04/2012 1448   BILITOT 0.5 08/16/2018 1512   BILITOT 0.70 10/04/2012 1448      Component Value Date/Time   TSH 1.18 08/16/2018 1512   TSH 0.702 12/19/2013 1218      OBESITY BEHAVIORAL INTERVENTION VISIT  Today's visit was # 2   Starting weight: 223 lbs Starting date: 08/26/2018 Today's weight : 217 lbs  Today's date: 09/09/2018 Total lbs lost to date: 6    ASK: We discussed the diagnosis of obesity with Idolina Primerobyn S Blevins today and Emily Davenport agreed to give us permission to discuss obesity behavioral modification therapy today.  ASSESS: Emily Davenport has the diagnosis of obesity and her BMI today is 37.23 Emily Davenport is in the action stage of change   ADVISE: Emily Davenport was educated on the multiple health risks of obesity as well as the benefit of weight loss to improve her health. She was advised of the need for long term treatment and the importance of lifestyle modifications to improve her current health and to decrease her risk of future health problems.  AGREE: Multiple dietary modification options and treatment options were discussed and  Codie agreed to follow the recommendations documented in the above note.  ARRANGE: Emily Davenport was educated on the  importance of frequent visits to treat obesity as outlined per CMS and USPSTF guidelines and agreed to schedule her next follow up appointment today.  I, Burt Knack, am acting as transcriptionist for Debbra Riding, MD  I have reviewed the above documentation for accuracy and completeness, and I agree with the above. - Debbra Riding, MD

## 2018-09-20 ENCOUNTER — Other Ambulatory Visit (INDEPENDENT_AMBULATORY_CARE_PROVIDER_SITE_OTHER): Payer: 59

## 2018-09-20 ENCOUNTER — Other Ambulatory Visit: Payer: Self-pay

## 2018-09-20 DIAGNOSIS — R945 Abnormal results of liver function studies: Secondary | ICD-10-CM

## 2018-09-20 DIAGNOSIS — R7989 Other specified abnormal findings of blood chemistry: Secondary | ICD-10-CM

## 2018-09-20 LAB — COMPREHENSIVE METABOLIC PANEL
ALT: 65 U/L — ABNORMAL HIGH (ref 0–35)
AST: 40 U/L — ABNORMAL HIGH (ref 0–37)
Albumin: 4.3 g/dL (ref 3.5–5.2)
Alkaline Phosphatase: 108 U/L (ref 39–117)
BUN: 22 mg/dL (ref 6–23)
CO2: 26 mEq/L (ref 19–32)
Calcium: 9.7 mg/dL (ref 8.4–10.5)
Chloride: 104 mEq/L (ref 96–112)
Creatinine, Ser: 1.06 mg/dL (ref 0.40–1.20)
GFR: 53.21 mL/min — ABNORMAL LOW (ref >60.00)
Glucose, Bld: 99 mg/dL (ref 70–99)
Potassium: 4.1 mEq/L (ref 3.5–5.1)
Sodium: 140 mEq/L (ref 135–145)
Total Bilirubin: 0.5 mg/dL (ref 0.2–1.2)
Total Protein: 7.1 g/dL (ref 6.0–8.3)

## 2018-09-20 LAB — GAMMA GT: GGT: 20 U/L (ref 7–51)

## 2018-09-21 LAB — HEPATITIS PANEL, ACUTE
Hep A IgM: NONREACTIVE
Hep B C IgM: NONREACTIVE
Hepatitis B Surface Ag: NONREACTIVE
Hepatitis C Ab: NONREACTIVE
SIGNAL TO CUT-OFF: 0.01 (ref <1.00)

## 2018-09-22 NOTE — Progress Notes (Signed)
Office: [phone]  /  Fax: [phone]    Date: September 30, 2018   Appointment Start Time: 2:00pm Duration: 25 minutes Provider: Lawerance Cruel, Psy.D. Type of Session: Individual Therapy  Location of Patient: Work Government social research officer of Provider: [facility] Type of Contact: Telepsychological Visit via [employer]   Session Content: Emily Davenport is a 58 y.o. female presenting via [employer] for a follow-up appointment to address the previously established treatment goal of increasing coping skills. Today's appointment was a telepsychological visit, as this provider's clinic is seeing a limited number of patients for in-person visits due to COVID-19. Therapeutic services will resume to in-person appointments once deemed appropriate. Emily Davenport expressed understanding regarding the rationale for telepsychological services, and provided verbal consent for today's appointment. Prior to proceeding with today's appointment, Emily Davenport's physical location at the time of this appointment was obtained. Emily Davenport reported she was at work and provided the address. In the event of technical difficulties, Emily Davenport shared a phone number she could be reached at. Emily Davenport and this provider participated in today's telepsychological service. Also, Emily Davenport denied anyone else being present in the room or on the WebEx appointment.  This provider conducted a brief check-in and verbally administered the PHQ-9 and GAD-7. Emily Davenport shared "nothing terribly significant" has changed since the last appointment with this provider; however, she shared about "disappointment" that her [place]'s [place]. Notably, she shared improvements in her recent lab work. Regarding eating, Emily Davenport indicated difficulty eating all aspects of her meal plan, but noted she continues to eat everything on the meal plan. It was recommended she discussed the aforementioned with Dr. Rinaldo Ratel during their next appointment;  she agreed. Notably, Emily Davenport acknowledged experiencing emotional hunger since the last appointment with this provider. Psychoeducation regarding triggers for emotional eating was provided. Emily Davenport was provided a handout, and encouraged to utilize the handout between now and the next appointment to increase awareness of triggers and frequency. Emily Davenport agreed. This provider also discussed behavioral strategies for specific triggers, such as placing the utensil down when conversing to avoid mindless eating. Emily Davenport provided verbal consent during today's appointment for this provider to send the handout about triggers via e-mail. She also requested this provider re-send the handout about physical and emotional hunger. Emily Davenport was receptive to today's session as evidenced by openness to sharing, responsiveness to feedback, and willingness to explore triggers for emotional eating.  Mental Status Examination:  Appearance: neat Behavior: cooperative Mood: euthymic Affect: mood congruent Speech: normal in rate, volume, and tone Eye Contact: appropriate Psychomotor Activity: appropriate Thought Process: linear, logical, and goal directed  Content/Perceptual Disturbances: no hallucinations, delusions, bizarre thinking or behavior reported or observed and no evidence of suicidal and homicidal ideation, plan, and intent Orientation: time, person, place and purpose of appointment Cognition/Sensorium: memory, attention, language, and fund of knowledge intact  Insight: good Judgment: good  Structured Assessment Results: The Patient Health Questionnaire-9 (PHQ-9) is a self-report measure that assesses symptoms and severity of depression over the course of the last two weeks. Emily Davenport obtained a score of 4 suggesting minimal depression. Emily Davenport finds the endorsed symptoms to be not difficult at all. [name] 0  Feeling down, depressed, or hopeless 1  Trouble falling or staying asleep, or  sleeping too much 0  Feeling tired or having little energy 3  Poor appetite or overeating 0  Feeling bad about yourself --- or that you are a failure or have  let yourself or your family down 0  Trouble concentrating on things, such as reading the newspaper or watching television 0  Moving or speaking so slowly that other people could have noticed? Or the opposite --- being so fidgety or restless that you have been moving around a lot more than usual 0  Thoughts that you would be better off dead or hurting yourself in some way 0  PHQ-9 Score 4    The Generalized Anxiety Disorder-7 (GAD-7) is a brief self-report measure that assesses symptoms of anxiety over the course of the last two weeks. Emily Davenport obtained a score of 0. Feeling nervous, anxious, on edge 0  Not being able to stop or control worrying 0  Worrying too much about different things 0  Trouble relaxing 0  Being so restless that it's hard to sit still 0  Becoming easily annoyed or irritable 0  Feeling afraid as if something awful might happen 0  GAD-7 Score 0   Interventions:  Conducted a brief chart review Verbal administration of PHQ-9 and GAD-7 for symptom monitoring Provided empathic reflections and validation Reviewed content from the previous session Psychoeducation provided regarding triggers for emotional eating Focused on rapport building Employed supportive psychotherapy interventions to facilitate reduced distress, and to improve coping skills with identified stressors  DSM-5 Diagnosis: 300.09 (F41.8) Other Specified Anxiety Disorder, Emotional Eating Behaviors  Treatment Goal & Progress: During the initial appointment with this provider, the following treatment goal was established: increase coping skills. Progress is limited, as Emily Davenport has just begun treatment with this provider; however, she is receptive to the interaction and interventions and rapport is being established.   Plan: Emily Davenport continues to appear able and  willing to participate as evidenced by engagement in reciprocal conversation, and asking questions for clarification as appropriate. The next appointment will be scheduled in three weeks, which will be via [employer]. The next session will focus on the introduction of mindfulness.

## 2018-09-23 ENCOUNTER — Ambulatory Visit (INDEPENDENT_AMBULATORY_CARE_PROVIDER_SITE_OTHER): Payer: 59 | Admitting: Family Medicine

## 2018-09-30 ENCOUNTER — Other Ambulatory Visit: Payer: Self-pay

## 2018-09-30 ENCOUNTER — Encounter (INDEPENDENT_AMBULATORY_CARE_PROVIDER_SITE_OTHER): Payer: Self-pay | Admitting: Family Medicine

## 2018-09-30 ENCOUNTER — Ambulatory Visit (INDEPENDENT_AMBULATORY_CARE_PROVIDER_SITE_OTHER): Payer: 59 | Admitting: Psychology

## 2018-09-30 DIAGNOSIS — F418 Other specified anxiety disorders: Secondary | ICD-10-CM | POA: Diagnosis not present

## 2018-10-05 ENCOUNTER — Ambulatory Visit (INDEPENDENT_AMBULATORY_CARE_PROVIDER_SITE_OTHER): Payer: 59 | Admitting: Family Medicine

## 2018-10-11 NOTE — Progress Notes (Signed)
Office: [phone]  /  Fax: [phone]    Date: October 25, 2018   Appointment Start Time: 10:04am Duration: 21 minutes Provider: Glennie Isle, Psy.D. Type of Session: Individual Therapy  Location of Patient: Parked in car Location of Provider: Provider's Home Type of Contact: Telepsychological Visit via [place] Content:Of note, this provider called Cinthia at 10:02am as she did not present for the [facility] appointment. She indicated she was experiencing difficulty joining. The e-mail with the secure link was re-sent. As such, today's appointment was initiated 4 minutes late. Emily Davenport is a 58 y.o. female presenting via [place] for a follow-up appointment to address the previously established treatment goal of increasing coping skills. Today's appointment was a telepsychological visit, as it is an option for appointments to reduce exposure to COVID-19. Emily Davenport expressed understanding regarding the rationale for telepsychological services, and provided verbal consent for today's appointment. Prior to proceeding with today's appointment, Emily Davenport's physical location at the time of this appointment was obtained. Emily Davenport reported she was parked in her car and provided details about her location. In the event of technical difficulties, Emily Davenport shared a phone number she could be reached at. Emily Davenport and this provider participated in today's telepsychological service. Also, Emily Davenport denied anyone else being present in the car or on the WebEx appointment.  This provider conducted a brief check-in. Emily Davenport shared she has not been as busy, and shared about recent events. Regarding eating, Emily Davenport shared, "It's going pretty good." She recalled having "emotional eating thoughts," and acknowledged "giving in" at times. Emily Davenport noted a reduction in emotional eating; positive reinforcement was provided. Psychoeducation regarding emotional hunger versus emotional eating was provided. In addition, psychoeducation regarding  mindfulness was provided. A handout was provided to Emily Davenport with further information regarding mindfulness, including exercises. This provider also explained the benefit of mindfulness as it relates to emotional eating. Emily Davenport was encouraged to engage in the provided exercises between now and the next appointment with this provider. Emily Davenport agreed. She was led through an exercise involving senses. She described the exercise as "helpful." Emily Davenport provided verbal consent during today's appointment for this provider to send the handout about mindfulness via e-mail. Overall, Emily Davenport was receptive to today's session as evidenced by openness to sharing, responsiveness to feedback, and willingness to engage in mindfulness exercises.  Mental Status Examination:  Appearance: neat Behavior: cooperative Mood: euthymic Affect: mood congruent Speech: normal in rate, volume, and tone Eye Contact: appropriate Psychomotor Activity: appropriate Thought Process: linear, logical, and goal directed  Content/Perceptual Disturbances: no hallucinations, delusions, bizarre thinking or behavior reported or observed and no evidence of suicidal and homicidal ideation, plan, and intent Orientation: time, person, place and purpose of appointment Cognition/Sensorium: memory, attention, language, and fund of knowledge intact  Insight: good Judgment: good  Interventions:  Conducted a brief chart review Provided empathic reflections and validation Reviewed content from the previous session Psychoeducation provided regarding mindfulness Engaged patient in a mindfulness exercise Employed supportive psychotherapy interventions to facilitate reduced distress, and to improve coping skills with identified stressors Employed acceptance and commitment interventions to emphasize mindfulness and acceptance without struggle  Psychoeducation provided regarding emotional hunger versus emotional eating  DSM-5 Diagnosis: 300.09 (F41.8) Other  Specified Anxiety Disorder, Emotional Eating Behaviors  Treatment Goal & Progress: During the initial appointment with this provider, the following treatment goal was established: increase coping skills. Emily Davenport has demonstrated progress in her goal as evidenced by increased awareness of hunger patterns and triggers for emotional eating. Emily Davenport also reported a  reduction in emotional eating and demonstrates willingness to engage in mindfulness exercises.  Plan: Emily Davenport continues to appear able and willing to participate as evidenced by engagement in reciprocal conversation, and asking questions for clarification as appropriate. Due to this provider being out of the office in the coming weeks, the next appointment will be scheduled in three weeks, which will be via [employer]. Emily Davenport acknowledged understanding. The next session will focus further on mindfulness.

## 2018-10-12 ENCOUNTER — Ambulatory Visit (INDEPENDENT_AMBULATORY_CARE_PROVIDER_SITE_OTHER): Payer: 59 | Admitting: Family Medicine

## 2018-10-12 ENCOUNTER — Other Ambulatory Visit: Payer: Self-pay

## 2018-10-12 VITALS — BP 109/78 | HR 69 | Temp 97.8°F | Ht 64.0 in | Wt 210.0 lb

## 2018-10-12 DIAGNOSIS — Z9189 Other specified personal risk factors, not elsewhere classified: Secondary | ICD-10-CM

## 2018-10-12 DIAGNOSIS — E559 Vitamin D deficiency, unspecified: Secondary | ICD-10-CM | POA: Diagnosis not present

## 2018-10-12 DIAGNOSIS — J3089 Other allergic rhinitis: Secondary | ICD-10-CM

## 2018-10-12 DIAGNOSIS — Z6836 Body mass index (BMI) 36.0-36.9, adult: Secondary | ICD-10-CM

## 2018-10-14 NOTE — Progress Notes (Signed)
Office: [phone]  /  Fax: [phone]   HPI:   Chief Complaint: OBESITY Emily Davenport is here to discuss her progress with her obesity treatment plan. She is on the Category 2 plan and is following her eating plan approximately 60 % of the time. She states she is walking for 60 minutes 3 times per week. Emily Davenport has had a rough few weeks (she has not been in the clinic since 09/09/18). She has been constipated and has needed to take stool softener. The last month was not home for 1 week and has had significant increased stress. She is occasionally skipping lunch.  Her weight is 210 lb (95.3 kg) today and has had a weight loss of 7 pounds over a period of 4-5 weeks since her last visit. She has lost 13 lbs since starting treatment with us.  Vitamin D Deficiency Emily Davenport has a diagnosis of vitamin D deficiency. She is currently taking prescription Vit D. She notes fatigue and denies nausea, vomiting or muscle weakness.  At risk for osteopenia and osteoporosis Emily Davenport is at higher risk of osteopenia and osteoporosis due to vitamin D deficiency.   Allergic Rhinitis Emily Davenport is on Allegra daily. She notes headaches but not much congestion or rhinorrhea.  ASSESSMENT AND PLAN:  Vitamin D deficiency  Allergic rhinitis due to other allergic trigger, unspecified seasonality  At risk for osteoporosis  Class 2 severe obesity with serious comorbidity and body mass index (BMI) of 36.0 to 36.9 in adult, unspecified obesity type (HCC)  PLAN:  Vitamin D Deficiency Inaara was informed that low vitamin D levels contributes to fatigue and are associated with obesity, breast, and colon cancer. Chevie agrees to continue taking prescription Vit D 50,000 IU every week and will follow up for routine testing of vitamin D, at least 2-3 times per year. She was informed of the risk of over-replacement of vitamin D and agrees to not increase her dose unless she discusses this with us first. Emily Davenport agrees to follow up with our  clinic in 2 weeks.  At risk for osteopenia and osteoporosis Emily Davenport was given extended (15 minutes) osteoporosis prevention counseling today. Emily Davenport is at risk for osteopenia and osteoporsis due to her vitamin D deficiency. She was encouraged to take her vitamin D and follow her higher calcium diet and increase strengthening exercise to help strengthen her bones and decrease her risk of osteopenia and osteoporosis.  Allergic Rhinitis Emily Davenport is to start Nasacort or Flonase, and she agrees to follow up with our clinic in 2 weeks.  Obesity Emily Davenport is currently in the action stage of change. As such, her goal is to continue with weight loss efforts She has agreed to follow the Category 2 plan with lunch options Emily Davenport has been instructed to work up to a goal of 150 minutes of combined cardio and strengthening exercise per week for weight loss and overall health benefits. We discussed the following Behavioral Modification Strategies today: increasing lean protein intake, increasing vegetables and work on meal planning and easy cooking plans, keeping healthy foods in the home, and planning for success   Emily Davenport has agreed to follow up with our clinic in 2 weeks. She was informed of the importance of frequent follow up visits to maximize her success with intensive lifestyle modifications for her multiple health conditions.  ALLERGIES: Allergies  Allergen Reactions   Augmentin [Amoxicillin-Pot Clavulanate]     diarrhea    MEDICATIONS: Current Outpatient Medications on File Prior to Visit  Medication Sig Dispense Refill  Cholecalciferol (VITAMIN D3) 50 MCG (2000 UT) TABS Take by mouth 1 day or 1 dose. 1 per day for 6 days     cloNIDine (CATAPRES) 0.2 MG tablet Take 0.2 mg by mouth 3 (three) times daily.      Ferrous Sulfate (IRON SLOW RELEASE) 140 (45 Fe) MG TBCR Take by mouth.     fexofenadine (ALLEGRA) 180 MG tablet Take 180 mg by mouth daily.     FLUoxetine (PROZAC) 20 MG capsule 2 (two)  times daily.      LORazepam (ATIVAN) 1 MG tablet Take 1 tablet (1 mg total) by mouth 3 (three) times daily as needed for anxiety. (Patient taking differently: Take 1 mg by mouth 2 (two) times daily. ) 40 tablet 1   Vitamin D, Ergocalciferol, (DRISDOL) 1.25 MG (50000 UT) CAPS capsule Take 1 capsule (50,000 Units total) by mouth every 7 (seven) days. 4 capsule 4   zolpidem (AMBIEN) 10 MG tablet Take 10 mg by mouth at bedtime as needed for sleep.     No current facility-administered medications on file prior to visit.     PAST MEDICAL HISTORY: Past Medical History:  Diagnosis Date   Allergies    Anxiety    Arthritis    Benign essential HTN 09/30/2015   Bilateral swelling of feet    Borderline hypertension    Depression    Fatty liver    GERD (gastroesophageal reflux disease)    H/O sinusitis 05/11/2015   High serum parathyroid hormone (PTH) 08/18/2018   History of chicken pox 05/11/2015   History of chicken pox 05/11/2015   Hx of endometriosis 03/28/2013   Hyperlipidemia    IBS (irritable bowel syndrome)    Insomnia    Kidney problem    Knee pain    Multiple sclerosis (HCC)    PKD (polycystic kidney disease)    Renal insufficiency 12/20/2013   Seasonal allergies 05/11/2015   Tear of lateral meniscus of right knee    Vitamin D deficiency     PAST SURGICAL HISTORY: Past Surgical History:  Procedure Laterality Date   BLADDER SURGERY  2009   sling   COLONOSCOPY     Finger Joint Surgery     KNEE ARTHROSCOPY WITH MEDIAL MENISECTOMY Right 08/15/2013   Procedure: RIGHT KNEE ARTHROSCOPY WITH DEBRIDEMENT/SHAVING (CHRONDRPLASTY), MEDIAL AND LATERAL MENISECTOMY;  Surgeon: Nilda Simmer, MD;  Location: [facility];  Service: Orthopedics;  Laterality: Right;   NASAL SINUS SURGERY  2000   planterfaciitis  1998   right   RHINOPLASTY  1979   UPPER GI ENDOSCOPY     VAGINAL HYSTERECTOMY  2010   WISDOM TOOTH EXTRACTION      SOCIAL  HISTORY: Social History   Tobacco Use   Smoking status: Never Smoker   Smokeless tobacco: Never Used  Substance Use Topics   Alcohol use: Yes    Comment: ocasionally   Drug use: No    FAMILY HISTORY: Family History  Problem Relation Age of Onset   Kidney disease Mother    Osteoporosis Mother    COPD Mother    Hypertension Mother    Hyperlipidemia Mother    Obesity Mother    Hypertension Father    Cancer Father        throat   Kidney disease Father        kidney stones and cancer   Alzheimer's disease Father    Hyperlipidemia Father    Hypertension Sister    Prostate cancer Maternal Grandfather  Alzheimer's disease Paternal Grandmother    Drug abuse Paternal Grandmother    Heart disease Paternal Grandfather    Drug abuse Paternal Grandfather     ROS: Review of Systems  Constitutional: Positive for malaise/fatigue and weight loss.  HENT: Negative for congestion.   Gastrointestinal: Negative for nausea and vomiting.  Musculoskeletal:       Negative muscle weakness  Neurological: Positive for headaches.    PHYSICAL EXAM: Blood pressure 109/78, pulse 69, temperature 97.8 F (36.6 C), temperature source Oral, height 5\' 4"  (1.626 m), weight 210 lb (95.3 kg), SpO2 98 %. Body mass index is 36.05 kg/m. Physical Exam Vitals signs reviewed.  Constitutional:      Appearance: Normal appearance. She is obese.  Cardiovascular:     Rate and Rhythm: Normal rate.     Pulses: Normal pulses.  Pulmonary:     Effort: Pulmonary effort is normal.     Breath sounds: Normal breath sounds.  Musculoskeletal: Normal range of motion.  Skin:    General: Skin is warm and dry.  Neurological:     Mental Status: She is alert and oriented to person, place, and time.  Psychiatric:        Mood and Affect: Mood normal.        Behavior: Behavior normal.     RECENT LABS AND TESTS: BMET    Component Value Date/Time   NA 140 09/20/2018 0819   NA 141 10/04/2012  1448   K 4.1 09/20/2018 0819   K 3.8 10/04/2012 1448   CL 104 09/20/2018 0819   CL 101 10/04/2012 1448   CO2 26 09/20/2018 0819   CO2 30 10/04/2012 1448   GLUCOSE 99 09/20/2018 0819   GLUCOSE 93 10/04/2012 1448   BUN 22 09/20/2018 0819   BUN 14 10/04/2012 1448   CREATININE 1.06 09/20/2018 0819   CREATININE 0.84 05/12/2014 1143   CALCIUM 9.7 09/20/2018 0819   CALCIUM 9.7 10/04/2012 1448   GFRNONAA 61 12/19/2013 1218   GFRAA 70 12/19/2013 1218   Lab Results  Component Value Date   HGBA1C 5.9 (H) 08/26/2018   Lab Results  Component Value Date   INSULIN 27.8 (H) 08/26/2018   CBC    Component Value Date/Time   WBC 6.7 08/16/2018 1512   RBC 5.09 08/16/2018 1512   HGB 14.4 08/16/2018 1512   HGB 13.4 10/04/2012 1448   HCT 43.8 08/16/2018 1512   HCT 40.0 10/04/2012 1448   PLT 258.0 08/16/2018 1512   PLT 235 10/04/2012 1448   MCV 86.0 08/16/2018 1512   MCV 88 10/04/2012 1448   MCH 28.1 12/19/2013 1218   MCHC 33.0 08/16/2018 1512   RDW 13.5 08/16/2018 1512   RDW 13.1 10/04/2012 1448   LYMPHSABS 2.2 12/19/2013 1218   LYMPHSABS 2.8 10/04/2012 1448   MONOABS 0.7 12/19/2013 1218   EOSABS 0.1 12/19/2013 1218   EOSABS 0.1 10/04/2012 1448   BASOSABS 0.0 12/19/2013 1218   BASOSABS 0.0 10/04/2012 1448   Iron/TIBC/Ferritin/ %Sat No results found for: IRON, TIBC, FERRITIN, IRONPCTSAT Lipid Panel     Component Value Date/Time   CHOL 245 (H) 08/26/2018 1021   TRIG 151 (H) 08/26/2018 1021   HDL 48 08/26/2018 1021   CHOLHDL 5 08/16/2018 1512   VLDL 28.8 08/16/2018 1512   LDLCALC 167 (H) 08/26/2018 1021   Hepatic Function Panel     Component Value Date/Time   PROT 7.1 09/20/2018 0819   PROT 7.7 10/04/2012 1448   ALBUMIN 4.3 09/20/2018 0819  ALBUMIN 3.9 10/04/2012 1448   AST 40 (H) 09/20/2018 0819   AST 19 10/04/2012 1448   ALT 65 (H) 09/20/2018 0819   ALT 15 10/04/2012 1448   ALKPHOS 108 09/20/2018 0819   ALKPHOS 104 (H) 10/04/2012 1448   BILITOT 0.5 09/20/2018 0819    BILITOT 0.70 10/04/2012 1448      Component Value Date/Time   TSH 1.18 08/16/2018 1512   TSH 0.702 12/19/2013 1218      OBESITY BEHAVIORAL INTERVENTION VISIT  Today's visit was # 3   Starting weight: 223 lbs Starting date: 08/26/2018 Today's weight : 210 lbs  Today's date: 10/12/2018 Total lbs lost to date: 13    ASK: We discussed the diagnosis of obesity with Idolina Primer today and Amri agreed to give [place] permission to discuss obesity behavioral modification therapy today.  ASSESS: Sieara has the diagnosis of obesity and her BMI today is 36.03 Makynzie is in the action stage of change   ADVISE: Ahnesty was educated on the multiple health risks of obesity as well as the benefit of weight loss to improve her health. She was advised of the need for long term treatment and the importance of lifestyle modifications to improve her current health and to decrease her risk of future health problems.  AGREE: Multiple dietary modification options and treatment options were discussed and  Chatara agreed to follow the recommendations documented in the above note.  ARRANGE: Starnisha was educated on the importance of frequent visits to treat obesity as outlined per CMS and USPSTF guidelines and agreed to schedule her next follow up appointment today.  I, Burt Knack, am acting as transcriptionist for Debbra Riding, MD  I have reviewed the above documentation for accuracy and completeness, and I agree with the above. - Debbra Riding, MD

## 2018-10-21 ENCOUNTER — Encounter (INDEPENDENT_AMBULATORY_CARE_PROVIDER_SITE_OTHER): Payer: Self-pay | Admitting: Family Medicine

## 2018-10-22 ENCOUNTER — Encounter (INDEPENDENT_AMBULATORY_CARE_PROVIDER_SITE_OTHER): Payer: Self-pay | Admitting: Family Medicine

## 2018-10-23 ENCOUNTER — Encounter: Payer: Self-pay | Admitting: Family Medicine

## 2018-10-25 ENCOUNTER — Other Ambulatory Visit: Payer: Self-pay

## 2018-10-25 ENCOUNTER — Ambulatory Visit (INDEPENDENT_AMBULATORY_CARE_PROVIDER_SITE_OTHER): Payer: 59 | Admitting: Psychology

## 2018-10-25 DIAGNOSIS — F418 Other specified anxiety disorders: Secondary | ICD-10-CM

## 2018-10-26 ENCOUNTER — Ambulatory Visit (INDEPENDENT_AMBULATORY_CARE_PROVIDER_SITE_OTHER): Payer: 59 | Admitting: Family Medicine

## 2018-11-02 NOTE — Progress Notes (Addendum)
Office: [phone]  /  Fax: [phone]    Date: November 16, 2018   Appointment Start Time: 12:34pm Duration: 24 minutes Provider: Glennie Isle, Psy.D. Type of Session: Individual Therapy  Location of Patient: Parked in car Location of Provider: [facility] Type of Contact: Telepsychological Visit via [place] Content:Of note, this provider called [name] at 12:32pm as she did not present for the [facility] appointment. She explained she was in a meeting, but could pull over in a parking lot and join. The e-mail with the secure link was re-sent. As such, today's appointment was initiated 4 minutes late.  Emily Davenport is a 58 y.o. female presenting via [place] for a follow-up appointment to address the previously established treatment goal of increasing coping skills. Today's appointment was a telepsychological visit, as it is an option for appointments to reduce exposure to COVID-19. Emily Davenport expressed understanding regarding the rationale for telepsychological services, and provided verbal consent for today's appointment. Prior to proceeding with today's appointment, Emily Davenport's physical location at the time of this appointment was obtained. Emily Davenport reported she was parked in her car and provided the address. In the event of technical difficulties, Emily Davenport shared a phone number she could be reached at. Emily Davenport and this provider participated in today's telepsychological service. Also, Emily Davenport denied anyone else being present in the car or on the Web[employer] appointment.  This provider conducted a brief check-in and verbally administered the PHQ-9 and GAD-7. Emily Davenport shared about recent events, including medical concerns for herself, husband, and dog. In addition, she reported, "I've been stress eating." She described fluctuations in appetite and also noted she feeling guilty about missing work. Associated thoughts and feelings were processed. In addition, this provider and Emily Davenport focused on  reducing guilt by focusing on her positive characteristics at work. She was encouraged to write down the characteristics she identified (e.g., reliable). This provider also engaged Emily Davenport in thought challenging by exploring with Emily Davenport what she would tell a friend going through similar circumstances. Moreover, this provider and Emily Davenport discussed engaging in mindfulness to assist with coping. She agreed to engage in the previously discussed exercises. Emily Davenport was also receptive to calling the clinic and scheduling an appointment with one of the other providers to further discuss weight loss. Emily Davenport was receptive to today's session as evidenced by openness to sharing, responsiveness to feedback, and willingness to engage in learned skills.  Mental Status Examination:  Appearance: neat Behavior: cooperative Mood: anxious Affect: mood congruent Speech: normal in rate, volume, and tone Eye Contact: appropriate Psychomotor Activity: appropriate Thought Process: linear, logical, and goal directed  Content/Perceptual Disturbances: no hallucinations, delusions, bizarre thinking or behavior reported or observed and no evidence of suicidal and homicidal ideation, plan, and intent Orientation: time, person, place and purpose of appointment Cognition/Sensorium: memory, attention, language, and fund of knowledge intact  Insight: good Judgment: good  Interventions:  Conducted a brief chart review Provided empathic reflections and validation Reviewed content from the previous session Processed thoughts and feelings Engaged patient in thought challenging/cognitive restructuring  Employed supportive psychotherapy interventions to facilitate reduced distress, and to improve coping skills with identified stressors  DSM-5 Diagnosis: 300.09 (F41.8) Other Specified Anxiety Disorder, Emotional Eating Behaviors  Treatment Goal & Progress: During the initial appointment with this provider, the following treatment goal was  established: increase coping skills. Emily Davenport has demonstrated progress in her goal as evidenced by increased awareness of hunger patterns and triggers for emotional eating. Emily Davenport also demonstrates willingness to engage in  mindfulness exercises.  Plan: Emily Davenport continues to appear able and willing to participate as evidenced by engagement in reciprocal conversation, and asking questions for clarification as appropriate. The next appointment will be scheduled in one week, which will be via [employer]. The next session will focus further on mindfulness.

## 2018-11-03 ENCOUNTER — Encounter: Payer: Self-pay | Admitting: Family Medicine

## 2018-11-15 ENCOUNTER — Ambulatory Visit: Payer: 59 | Admitting: Family Medicine

## 2018-11-15 ENCOUNTER — Encounter (INDEPENDENT_AMBULATORY_CARE_PROVIDER_SITE_OTHER): Payer: Self-pay

## 2018-11-16 ENCOUNTER — Other Ambulatory Visit: Payer: Self-pay

## 2018-11-16 ENCOUNTER — Ambulatory Visit (INDEPENDENT_AMBULATORY_CARE_PROVIDER_SITE_OTHER): Payer: 59 | Admitting: Psychology

## 2018-11-16 DIAGNOSIS — F418 Other specified anxiety disorders: Secondary | ICD-10-CM

## 2018-11-18 NOTE — Progress Notes (Signed)
Office: [phone]  /  Fax: [phone]    Date: November 25, 2018   Appointment Start Time: 9:03am Duration: 30 minutes Provider: Lawerance Cruel, Psy.D. Type of Session: Individual Therapy  Location of Patient: Parked in car at work Location of Provider: [facility] Type of Contact: Telepsychological Visit via [employer]   Session Content: Emily Davenport is a 58 y.o. female presenting via [employer] for a follow-up appointment to address the previously established treatment goal of increasing coping skills. Today's appointment was a telepsychological visit, as it is an option for appointments to reduce exposure to COVID-19. Kyrah expressed understanding regarding the rationale for telepsychological services, and provided verbal consent for today's appointment. Prior to proceeding with today's appointment, [name]'s physical location at the time of this appointment was obtained. In the event of technical difficulties, [name] shared a phone number she could be reached at. Christie and this provider participated in today's telepsychological service. Also, [name] denied anyone else being present in the car or on the WebEx appointment.  This provider conducted a brief check-in and verbally administered the PHQ-9 and GAD-7. [name] shared, "Things are settling down. I feel way more like myself." She further shared about an improvement in her eating habits since the last appointment with this provider. However, she discussed she is "so tired of the back and forth" with her weight loss journey. Associated thoughts and feelings were processed. Additionally, psychoeducation regarding the connection between thoughts, feelings, and behaviors was provided. Moreover, psychoeducation regarding common cognitive distortions associated with emotional eating was provided. [name] provided verbal consent during today's appointment for this provider to send the handout about cognitive distortions via e-mail. This  provider further discussed utilizing mindfulness (e.g., five senses exercise) to assist with coping. [name] was receptive to today's session as evidenced by openness to sharing, responsiveness to feedback, and willingness to identify cognitive distortions.  Mental Status Examination:  Appearance: neat Behavior: cooperative Mood: euthymic Affect: mood congruent Speech: normal in rate, volume, and tone Eye Contact: appropriate Psychomotor Activity: appropriate Thought Process: linear, logical, and goal directed  Content/Perceptual Disturbances: no hallucinations, delusions, bizarre thinking or behavior reported or observed and no evidence of suicidal and homicidal ideation, plan, and intent Orientation: time, person, place and purpose of appointment Cognition/Sensorium: memory, attention, language, and fund of knowledge intact  Insight: good Judgment: good  Structured Assessment Results: The Patient Health Questionnaire-9 (PHQ-9) is a self-report measure that assesses symptoms and severity of depression over the course of the last two weeks. [name] obtained a score of 7 suggesting mild depression. [name] finds the endorsed symptoms to be somewhat difficult. [name] 0  Feeling down, depressed, or hopeless 1  Trouble falling or staying asleep, or sleeping too much 0  Feeling tired or having little energy 2  Poor appetite or overeating 1  Feeling bad about yourself --- or that you are a failure or have let yourself or your family down 3  Trouble concentrating on things, such as reading the newspaper or watching television 0  Moving or speaking so slowly that other people could have noticed? Or the opposite --- being so fidgety or restless that you have been moving around a lot more than usual 0  Thoughts that you would be better off dead or hurting yourself in some way 0  PHQ-9 Score 7    The Generalized Anxiety Disorder-7 (GAD-7) is a brief self-report  measure that assesses symptoms of anxiety over the course of the  last two weeks. [name] obtained a score of 7 suggesting mild anxiety. [name] finds the endorsed symptoms to be somewhat difficult. Feeling nervous, anxious, on edge 1  Not being able to stop or control worrying 0  Worrying too much about different things 2  Trouble relaxing 2  Being so restless that it's hard to sit still 0  Becoming easily annoyed or irritable 1  Feeling afraid as if something awful might happen 1  GAD-7 Score 7   Interventions:  Conducted a brief chart review Verbal administration of PHQ-9 and GAD-7 for symptom monitoring Provided empathic reflections and validation Processed thoughts and feelings Psychoeducation provided regarding cognitive distortions  Psychoeducation provided regarding the connection between thoughts, feelings, and behaviors Employed supportive psychotherapy interventions to facilitate reduced distress, and to improve coping skills with identified stressors  DSM-5 Diagnosis: 300.09 (F41.8) Other Specified Anxiety Disorder, Emotional Eating Behaviors  Treatment Goal & Progress: During the initial appointment with this provider, the following treatment goal was established: increase coping skills. [name] has demonstrated progress in her goal as evidenced by increased awareness of hunger patterns and triggers for emotional eating. [name] also continues to demonstrate willingness to engage in learned skill(s).  Plan: [name] continues to appear able and willing to participate as evidenced by engagement in reciprocal conversation, and asking questions for clarification as appropriate. The next appointment will be scheduled in three weeks, which will be via [employer]. The next session will focus on reviewing learned skills, and working towards the established treatment goal.

## 2018-11-25 ENCOUNTER — Ambulatory Visit (INDEPENDENT_AMBULATORY_CARE_PROVIDER_SITE_OTHER): Payer: 59 | Admitting: Psychology

## 2018-11-25 ENCOUNTER — Other Ambulatory Visit: Payer: Self-pay

## 2018-11-25 DIAGNOSIS — F418 Other specified anxiety disorders: Secondary | ICD-10-CM | POA: Diagnosis not present

## 2018-11-26 ENCOUNTER — Encounter: Payer: Self-pay | Admitting: Family Medicine

## 2018-11-29 ENCOUNTER — Encounter: Payer: Self-pay | Admitting: *Deleted

## 2018-11-29 NOTE — Telephone Encounter (Signed)
Ct requested and sent another mychart message with Dr. Rhae Lerner response.

## 2018-11-30 NOTE — Telephone Encounter (Signed)
We received the CT from [facility] and it is in your yellow folder

## 2018-12-01 NOTE — Progress Notes (Unsigned)
Office: [phone]  /  Fax: [phone]    Date: December 14, 2018   Appointment Start Time: *** Duration: *** minutes Provider: Glennie Isle, Psy.D. Type of Session: Individual Therapy  Location of Patient: {gbptloc:23249} Location of Provider: {Location of Service:22491} Type of Contact: Telepsychological Visit via [employer]   Session Content: Emily Davenport is a 58 y.o. female presenting via [employer] for a follow-up appointment to address the previously established treatment goal of increasing coping skills. Today's appointment was a telepsychological visit, as it is an option for appointments to reduce exposure to COVID-19. Emily Davenport expressed understanding regarding the rationale for telepsychological services, and provided verbal consent for today's appointment. Prior to proceeding with today's appointment, Emily Davenport's physical location at the time of this appointment was obtained. In the event of technical difficulties, Emily Davenport shared a phone number she could be reached at. Emily Davenport and this provider participated in today's telepsychological service. Also, Emily Davenport denied anyone else being present in the room or on the WebEx appointment ***.  This provider conducted a brief check-in and verbally administered the PHQ-9 and GAD-7. *** Emily Davenport was receptive to today's session as evidenced by openness to sharing, responsiveness to feedback, and ***.  Mental Status Examination:  Appearance: {Appearance:22431} Behavior: {Behavior:22445} Mood: {gbmood:21757} Affect: {Affect:22436} Speech: {Speech:22432} Eye Contact: {Eye Contact:22433} Psychomotor Activity: {Motor Activity:22434} Thought Process: {thought process:22448}  Content/Perceptual Disturbances: {disturbances:22451} Orientation: {Orientation:22437} Cognition/Sensorium: {gbcognition:22449} Insight: {Insight:22446} Judgment: {Insight:22446}  Structured Assessment Results: The Patient Health Questionnaire-9 (PHQ-9) is a self-report measure that  assesses symptoms and severity of depression over the course of the last two weeks. Emily Davenport obtained a score of *** suggesting {GBPHQ9SEVERITY:21752}. Emily Davenport finds the endorsed symptoms to be {gbphq9difficulty:21754}. Emily Davenport interest or pleasure in doing things ***  Feeling down, depressed, or hopeless ***  Trouble falling or staying asleep, or sleeping too much ***  Feeling tired or having Emily Davenport energy ***  Poor appetite or overeating ***  Feeling bad about yourself --- or that you are a failure or have let yourself or your family down ***  Trouble concentrating on things, such as reading the newspaper or watching television ***  Moving or speaking so slowly that other people could have noticed? Or the opposite --- being so fidgety or restless that you have been moving around a lot more than usual ***  Thoughts that you would be better off dead or hurting yourself in some way ***  PHQ-9 Score ***    The Generalized Anxiety Disorder-7 (GAD-7) is a brief self-report measure that assesses symptoms of anxiety over the course of the last two weeks. Emily Davenport obtained a score of *** suggesting {gbgad7severity:21753}. Emily Davenport finds the endorsed symptoms to be {gbphq9difficulty:21754}. Feeling nervous, anxious, on edge ***  Not being able to stop or control worrying ***  Worrying too much about different things ***  Trouble relaxing ***  Being so restless that it's hard to sit still ***  Becoming easily annoyed or irritable ***  Feeling afraid as if something awful might happen ***  GAD-7 Score ***   Interventions:  {Interventions:22172}  DSM-5 Diagnosis: 300.09 (F41.8) Other Specified Anxiety Disorder, Emotional Eating Behaviors  Treatment Goal & Progress: During the initial appointment with this provider, the following treatment goal was established: increase coping skills. Emily Davenport has demonstrated progress in her goal as evidenced by {gbtxprogress:22839}. Emily Davenport also reported {gbtxprogress2:22951}.   Plan: Emily Davenport continues to appear able and willing to participate as evidenced by engagement in reciprocal conversation, and asking questions for clarification as appropriate. The next appointment  will be scheduled in {gbweeks:21758}, which will be via [employer]. The next session will focus on reviewing learned skills, and working towards the established treatment goal.***

## 2018-12-03 ENCOUNTER — Ambulatory Visit (INDEPENDENT_AMBULATORY_CARE_PROVIDER_SITE_OTHER): Payer: 59 | Admitting: Family Medicine

## 2018-12-03 ENCOUNTER — Other Ambulatory Visit: Payer: Self-pay

## 2018-12-03 VITALS — BP 120/78 | Wt 210.0 lb

## 2018-12-03 DIAGNOSIS — R5383 Other fatigue: Secondary | ICD-10-CM | POA: Diagnosis not present

## 2018-12-03 DIAGNOSIS — F419 Anxiety disorder, unspecified: Secondary | ICD-10-CM

## 2018-12-03 DIAGNOSIS — G479 Sleep disorder, unspecified: Secondary | ICD-10-CM

## 2018-12-03 DIAGNOSIS — R0683 Snoring: Secondary | ICD-10-CM | POA: Diagnosis not present

## 2018-12-03 DIAGNOSIS — J329 Chronic sinusitis, unspecified: Secondary | ICD-10-CM

## 2018-12-03 DIAGNOSIS — E669 Obesity, unspecified: Secondary | ICD-10-CM

## 2018-12-03 DIAGNOSIS — I700 Atherosclerosis of aorta: Secondary | ICD-10-CM

## 2018-12-03 DIAGNOSIS — R7989 Other specified abnormal findings of blood chemistry: Secondary | ICD-10-CM

## 2018-12-03 DIAGNOSIS — I10 Essential (primary) hypertension: Secondary | ICD-10-CM

## 2018-12-05 DIAGNOSIS — G4733 Obstructive sleep apnea (adult) (pediatric): Secondary | ICD-10-CM | POA: Insufficient documentation

## 2018-12-05 DIAGNOSIS — G479 Sleep disorder, unspecified: Secondary | ICD-10-CM | POA: Insufficient documentation

## 2018-12-05 DIAGNOSIS — J329 Chronic sinusitis, unspecified: Secondary | ICD-10-CM | POA: Insufficient documentation

## 2018-12-05 DIAGNOSIS — I700 Atherosclerosis of aorta: Secondary | ICD-10-CM | POA: Insufficient documentation

## 2018-12-05 DIAGNOSIS — R0683 Snoring: Secondary | ICD-10-CM | POA: Insufficient documentation

## 2018-12-05 NOTE — Assessment & Plan Note (Signed)
Snoring, fatigue, obesity, and more. Referred to pulmonology for possible sleep study

## 2018-12-05 NOTE — Assessment & Plan Note (Signed)
Supplement and monitor 

## 2018-12-05 NOTE — Assessment & Plan Note (Signed)
Monitor vitals weekly and report any concerns, no changes to meds. Encouraged heart healthy diet such as the DASH diet and exercise as tolerated.  

## 2018-12-05 NOTE — Progress Notes (Signed)
Virtual Visit via Video Note  I connected with Emily Davenport on 12/03/18 at  9:20 AM EST by a video enabled telemedicine application and verified that I am speaking with the correct person using two identifiers.  Location: Patient: home Provider: home   I discussed the limitations of evaluation and management by telemedicine and the availability of in person appointments. The patient expressed understanding and agreed to proceed. Emily Davenport, CMA was able to get the patient set up on video visit   Subjective:    Patient ID: Emily Davenport, female    DOB: 03-15-1960, 58 y.o.   MRN: [id number]  No chief complaint on file.   HPI Patient is in today for follow up on numerous concerns including aortic atherosclerosis, hypertension, anxiety and more. No recent febrile illness or hospitalizations. She is noting increased congestion, restless sleep, weight gain, snoring. She has had her turbates reduced in the past. She is using nasacort but still struggling with congesiton. Denies CP/palp/SOB/HA/fevers/GI or GU c/o. Taking meds as prescribed  Past Medical History:  Diagnosis Date  . Allergies   . Anxiety   . Arthritis   . Benign essential HTN 09/30/2015  . Bilateral swelling of feet   . Borderline hypertension   . Depression   . Fatty liver   . GERD (gastroesophageal reflux disease)   . H/O sinusitis 05/11/2015  . High serum parathyroid hormone (PTH) 08/18/2018  . History of chicken pox 05/11/2015  . History of chicken pox 05/11/2015  . Hx of endometriosis 03/28/2013  . Hyperlipidemia   . IBS (irritable bowel syndrome)   . Insomnia   . Kidney problem   . Knee pain   . Multiple sclerosis (HCC)   . PKD (polycystic kidney disease)   . Renal insufficiency 12/20/2013  . Seasonal allergies 05/11/2015  . Tear of lateral meniscus of right knee   . Vitamin D deficiency     Past Surgical History:  Procedure Laterality Date  . BLADDER SURGERY  2009   sling  . COLONOSCOPY    .  Finger Joint Surgery    . KNEE ARTHROSCOPY WITH MEDIAL MENISECTOMY Right 08/15/2013   Procedure: RIGHT KNEE ARTHROSCOPY WITH DEBRIDEMENT/SHAVING (CHRONDRPLASTY), MEDIAL AND LATERAL MENISECTOMY;  Surgeon: Nilda Simmer, MD;  Location: [facility];  Service: Orthopedics;  Laterality: Right;  . NASAL SINUS SURGERY  2000  . planterfaciitis  1998   right  . RHINOPLASTY  1979  . UPPER GI ENDOSCOPY    . VAGINAL HYSTERECTOMY  2010  . WISDOM TOOTH EXTRACTION      Family History  Problem Relation Age of Onset  . Kidney disease Mother   . Osteoporosis Mother   . COPD Mother   . Hypertension Mother   . Hyperlipidemia Mother   . Obesity Mother   . Hypertension Father   . Cancer Father        throat  . Kidney disease Father        kidney stones and cancer  . Alzheimer's disease Father   . Hyperlipidemia Father   . Hypertension Sister   . Prostate cancer Maternal Grandfather   . Alzheimer's disease Paternal Grandmother   . Drug abuse Paternal Grandmother   . Heart disease Paternal Grandfather   . Drug abuse Paternal Grandfather     Social History   Socioeconomic History  . Marital status: Married    Spouse name: Not on file  . Number of children: Not on file  . Years of education:  Not on file  . Highest education level: Not on file  Occupational History  . Not on file  Social Needs  . Financial resource strain: Not on file  . Food insecurity    Worry: Not on file    Inability: Not on file  . Transportation needs    Medical: Not on file    Non-medical: Not on file  Tobacco Use  . Smoking status: Never Smoker  . Smokeless tobacco: Never Used  Substance and Sexual Activity  . Alcohol use: Yes    Comment: ocasionally  . Drug use: No  . Sexual activity: Yes  Lifestyle  . Physical activity    Days per week: Not on file    Minutes per session: Not on file  . Stress: Not on file  Relationships  . Social Musician on phone: Not on file    Gets  together: Not on file    Attends religious service: Not on file    Active member of club or organization: Not on file    Attends meetings of clubs or organizations: Not on file    Relationship status: Not on file  . Intimate partner violence    Fear of current or ex partner: Not on file    Emotionally abused: Not on file    Physically abused: Not on file    Forced sexual activity: Not on file  Other Topics Concern  . Not on file  Social History Narrative   Retired Hotel manager, lives with husband    Outpatient Medications Prior to Visit  Medication Sig Dispense Refill  . Cholecalciferol (VITAMIN D3) 50 MCG (2000 UT) TABS Take by mouth 1 day or 1 dose. 1 per day for 6 days    . cloNIDine (CATAPRES) 0.2 MG tablet Take 0.2 mg by mouth 3 (three) times daily.     . Ferrous Sulfate (IRON SLOW RELEASE) 140 (45 Fe) MG TBCR Take by mouth.    . fexofenadine (ALLEGRA) 180 MG tablet Take 180 mg by mouth daily.    Marland Kitchen FLUoxetine (PROZAC) 20 MG capsule 2 (two) times daily.     Marland Kitchen LORazepam (ATIVAN) 1 MG tablet Take 1 tablet (1 mg total) by mouth 3 (three) times daily as needed for anxiety. (Patient taking differently: Take 1 mg by mouth 2 (two) times daily. ) 40 tablet 1  . Vitamin D, Ergocalciferol, (DRISDOL) 1.25 MG (50000 UT) CAPS capsule Take 1 capsule (50,000 Units total) by mouth every 7 (seven) days. 4 capsule 4  . zolpidem (AMBIEN) 10 MG tablet Take 10 mg by mouth at bedtime as needed for sleep.     No facility-administered medications prior to visit.     Allergies  Allergen Reactions  . Augmentin [Amoxicillin-Pot Clavulanate]     diarrhea    Review of Systems  Constitutional: Positive for malaise/fatigue. Negative for fever.  HENT: Positive for congestion.   Eyes: Negative for blurred vision.  Respiratory: Negative for shortness of breath.   Cardiovascular: Negative for chest pain, palpitations and leg swelling.  Gastrointestinal: Negative for abdominal pain, blood  in stool and nausea.  Genitourinary: Negative for dysuria and frequency.  Musculoskeletal: Negative for falls.  Skin: Negative for rash.  Neurological: Negative for dizziness, loss of consciousness and headaches.  Endo/Heme/Allergies: Negative for environmental allergies.  Psychiatric/Behavioral: Negative for depression. The patient has insomnia. The patient is not nervous/anxious.        Objective:    Physical Exam Constitutional:  Appearance: Normal appearance. She is not ill-appearing.  HENT:     Head: Normocephalic and atraumatic.     Nose: Nose normal.  Eyes:     General:        Right eye: No discharge.        Left eye: No discharge.  Pulmonary:     Effort: Pulmonary effort is normal.  Neurological:     Mental Status: She is alert and oriented to person, place, and time.  Psychiatric:        Mood and Affect: Mood normal.        Behavior: Behavior normal.     BP 120/78 (BP Location: Left Arm, Patient Position: Sitting, Cuff Size: Normal)   Wt 210 lb (95.3 kg)   BMI 36.05 kg/m  Wt Readings from Last 3 Encounters:  12/03/18 210 lb (95.3 kg)  10/12/18 210 lb (95.3 kg)  09/09/18 217 lb (98.4 kg)    Diabetic Foot Exam - Simple   No data filed     Lab Results  Component Value Date   WBC 6.7 08/16/2018   HGB 14.4 08/16/2018   HCT 43.8 08/16/2018   PLT 258.0 08/16/2018   GLUCOSE 99 09/20/2018   CHOL 245 (H) 08/26/2018   TRIG 151 (H) 08/26/2018   HDL 48 08/26/2018   LDLCALC 167 (H) 08/26/2018   ALT 65 (H) 09/20/2018   AST 40 (H) 09/20/2018   NA 140 09/20/2018   K 4.1 09/20/2018   CL 104 09/20/2018   CREATININE 1.06 09/20/2018   BUN 22 09/20/2018   CO2 26 09/20/2018   TSH 1.18 08/16/2018   HGBA1C 5.9 (H) 08/26/2018    Lab Results  Component Value Date   TSH 1.18 08/16/2018   Lab Results  Component Value Date   WBC 6.7 08/16/2018   HGB 14.4 08/16/2018   HCT 43.8 08/16/2018   MCV 86.0 08/16/2018   PLT 258.0 08/16/2018   Lab Results   Component Value Date   NA 140 09/20/2018   K 4.1 09/20/2018   CO2 26 09/20/2018   GLUCOSE 99 09/20/2018   BUN 22 09/20/2018   CREATININE 1.06 09/20/2018   BILITOT 0.5 09/20/2018   ALKPHOS 108 09/20/2018   AST 40 (H) 09/20/2018   ALT 65 (H) 09/20/2018   PROT 7.1 09/20/2018   ALBUMIN 4.3 09/20/2018   CALCIUM 9.7 09/20/2018   GFR 53.21 (L) 09/20/2018   Lab Results  Component Value Date   CHOL 245 (H) 08/26/2018   Lab Results  Component Value Date   HDL 48 08/26/2018   Lab Results  Component Value Date   LDLCALC 167 (H) 08/26/2018   Lab Results  Component Value Date   TRIG 151 (H) 08/26/2018   Lab Results  Component Value Date   CHOLHDL 5 08/16/2018   Lab Results  Component Value Date   HGBA1C 5.9 (H) 08/26/2018       Assessment & Plan:   Problem List Items Addressed This Visit    Anxiety    She notes anxiety over whelmed her with the CT results but she has been able to accept the results now.       Benign essential HTN    Monitor vitals weekly and report any concerns, no changes to meds. Encouraged heart healthy diet such as the DASH diet and exercise as tolerated.       Relevant Orders   Ambulatory referral to Pulmonology   Low vitamin D level    Supplement and monitor  Obesity    Encouraged DASH diet, decrease po intake and increase exercise as tolerated. Needs 7-8 hours of sleep nightly. Avoid trans fats, eat small, frequent meals every 4-5 hours with lean proteins, complex carbs and healthy fats. Minimize simple carbs, can consider [employer] APP      Relevant Orders   Ambulatory referral to Pulmonology   Aortic atherosclerosis (HCC)    Seen on recent CT done by urology. She has never been a smoker and she is asymptomatic. No further imaging at this time. Discussed need to decrease risk factors by eating a heart healthy diet and increasing exercise.      Sinusitis    With history of turbinate reductions, now having increased congestion, snoring,  recurrent sinusitis. Referred to ENT for consideration      Relevant Orders   Ambulatory referral to ENT   Ambulatory referral to Pulmonology   Restless sleeper    Snoring, fatigue, obesity, and more. Referred to pulmonology for possible sleep study      Relevant Orders   Ambulatory referral to Pulmonology   Snoring   Relevant Orders   Ambulatory referral to ENT   Ambulatory referral to Pulmonology    Other Visit Diagnoses    Other fatigue    -  Primary   Relevant Orders   Ambulatory referral to ENT   Ambulatory referral to Pulmonology      I am having Idolina Primerobyn S. Lauro maintain her cloNIDine, LORazepam, zolpidem, FLUoxetine, Vitamin D (Ergocalciferol), fexofenadine, Vitamin D3, and Ferrous Sulfate.  No orders of the defined types were placed in this encounter.    I discussed the assessment and treatment plan with the patient. The patient was provided an opportunity to ask questions and all were answered. The patient agreed with the plan and demonstrated an understanding of the instructions.   The patient was advised to call back or seek an in-person evaluation if the symptoms worsen or if the condition fails to improve as anticipated.  I provided 25 minutes of non-face-to-face time during this encounter.   Danise EdgeStacey Blyth, MD

## 2018-12-05 NOTE — Assessment & Plan Note (Signed)
Encouraged DASH diet, decrease po intake and increase exercise as tolerated. Needs 7-8 hours of sleep nightly. Avoid trans fats, eat small, frequent meals every 4-5 hours with lean proteins, complex carbs and healthy fats. Minimize simple carbs, can consider [employer] APP

## 2018-12-05 NOTE — Assessment & Plan Note (Signed)
With history of turbinate reductions, now having increased congestion, snoring, recurrent sinusitis. Referred to ENT for consideration

## 2018-12-05 NOTE — Assessment & Plan Note (Signed)
She notes anxiety over whelmed her with the CT results but she has been able to accept the results now.

## 2018-12-05 NOTE — Assessment & Plan Note (Signed)
Seen on recent CT done by urology. She has never been a smoker and she is asymptomatic. No further imaging at this time. Discussed need to decrease risk factors by eating a heart healthy diet and increasing exercise.

## 2018-12-07 ENCOUNTER — Ambulatory Visit (INDEPENDENT_AMBULATORY_CARE_PROVIDER_SITE_OTHER): Payer: 59 | Admitting: Physician Assistant

## 2018-12-10 ENCOUNTER — Ambulatory Visit: Payer: 59 | Admitting: Family Medicine

## 2018-12-14 ENCOUNTER — Ambulatory Visit (INDEPENDENT_AMBULATORY_CARE_PROVIDER_SITE_OTHER): Payer: 59 | Admitting: Psychology

## 2018-12-20 ENCOUNTER — Telehealth: Payer: Self-pay | Admitting: Family Medicine

## 2018-12-20 DIAGNOSIS — R7989 Other specified abnormal findings of blood chemistry: Secondary | ICD-10-CM

## 2018-12-20 DIAGNOSIS — R7303 Prediabetes: Secondary | ICD-10-CM

## 2018-12-20 DIAGNOSIS — E785 Hyperlipidemia, unspecified: Secondary | ICD-10-CM

## 2018-12-20 DIAGNOSIS — I10 Essential (primary) hypertension: Secondary | ICD-10-CM

## 2018-12-20 DIAGNOSIS — R5383 Other fatigue: Secondary | ICD-10-CM

## 2018-12-20 NOTE — Telephone Encounter (Signed)
Called pt to schedule labs B4/A1C, Pth, vit D Pt will call back to schedule Please enter orders

## 2018-12-21 NOTE — Telephone Encounter (Signed)
Orders placed.

## 2018-12-27 ENCOUNTER — Encounter: Payer: Self-pay | Admitting: Family Medicine

## 2018-12-27 DIAGNOSIS — G4733 Obstructive sleep apnea (adult) (pediatric): Secondary | ICD-10-CM | POA: Insufficient documentation

## 2018-12-27 DIAGNOSIS — R0683 Snoring: Secondary | ICD-10-CM | POA: Insufficient documentation

## 2018-12-27 DIAGNOSIS — J310 Chronic rhinitis: Secondary | ICD-10-CM | POA: Insufficient documentation

## 2019-01-10 ENCOUNTER — Other Ambulatory Visit: Payer: Self-pay | Admitting: Family Medicine

## 2019-01-21 HISTORY — PX: BLEPHAROPLASTY: SUR158

## 2019-03-07 ENCOUNTER — Other Ambulatory Visit: Payer: Self-pay

## 2019-03-07 ENCOUNTER — Emergency Department (HOSPITAL_BASED_OUTPATIENT_CLINIC_OR_DEPARTMENT_OTHER)
Admission: EM | Admit: 2019-03-07 | Discharge: 2019-03-07 | Disposition: A | Discharge status: Home / Self Care | Payer: 59 | Attending: Emergency Medicine | Admitting: Emergency Medicine

## 2019-03-07 ENCOUNTER — Encounter (HOSPITAL_BASED_OUTPATIENT_CLINIC_OR_DEPARTMENT_OTHER): Payer: Self-pay

## 2019-03-07 ENCOUNTER — Emergency Department (HOSPITAL_BASED_OUTPATIENT_CLINIC_OR_DEPARTMENT_OTHER): Payer: 59

## 2019-03-07 DIAGNOSIS — I10 Essential (primary) hypertension: Secondary | ICD-10-CM | POA: Diagnosis not present

## 2019-03-07 DIAGNOSIS — S61452A Open bite of left hand, initial encounter: Secondary | ICD-10-CM | POA: Insufficient documentation

## 2019-03-07 DIAGNOSIS — G35 Multiple sclerosis: Secondary | ICD-10-CM | POA: Diagnosis not present

## 2019-03-07 DIAGNOSIS — Y939 Activity, unspecified: Secondary | ICD-10-CM | POA: Diagnosis not present

## 2019-03-07 DIAGNOSIS — W540XXA Bitten by dog, initial encounter: Secondary | ICD-10-CM | POA: Insufficient documentation

## 2019-03-07 DIAGNOSIS — Z23 Encounter for immunization: Secondary | ICD-10-CM | POA: Diagnosis not present

## 2019-03-07 DIAGNOSIS — Y999 Unspecified external cause status: Secondary | ICD-10-CM | POA: Insufficient documentation

## 2019-03-07 DIAGNOSIS — Z880 Allergy status to penicillin: Secondary | ICD-10-CM | POA: Diagnosis not present

## 2019-03-07 DIAGNOSIS — Z79899 Other long term (current) drug therapy: Secondary | ICD-10-CM | POA: Diagnosis not present

## 2019-03-07 DIAGNOSIS — Y929 Unspecified place or not applicable: Secondary | ICD-10-CM | POA: Diagnosis not present

## 2019-03-07 MED ORDER — AMOXICILLIN-POT CLAVULANATE 875-125 MG PO TABS: 1.0000 | ORAL_TABLET | Freq: Two times a day (BID) | ORAL | 0 refills | Status: AC

## 2019-03-07 MED ORDER — TETANUS-DIPHTH-ACELL PERTUSSIS 5-2.5-18.5 LF-MCG/0.5 IM SUSP
0.5000 mL | Freq: Once | INTRAMUSCULAR | Status: AC
  Administered 2019-03-07: 0.5 mL via INTRAMUSCULAR
  Filled 2019-03-07: qty 0.5

## 2019-03-07 MED ORDER — AMOXICILLIN-POT CLAVULANATE 875-125 MG PO TABS
1.0000 | ORAL_TABLET | Freq: Once | ORAL | Status: AC
  Administered 2019-03-07: 1 via ORAL
  Filled 2019-03-07: qty 1

## 2019-03-07 MED ORDER — LIDOCAINE-EPINEPHRINE (PF) 2 %-1:200000 IJ SOLN
10.0000 mL | Freq: Once | INTRAMUSCULAR | Status: AC
  Administered 2019-03-07: 10 mL
  Filled 2019-03-07: qty 10

## 2019-03-07 NOTE — ED Provider Notes (Signed)
[facility] EMERGENCY DEPARTMENT Provider Note   CSN: [id number] Arrival date & time: 03/07/19  2121     History Chief Complaint  Patient presents with  . Animal Bite    Emily Davenport is a 59 y.o. female with history significant for anxiety, hypertension, polycystic kidney disease who presents for evaluation of dog bite.  Patient states approximately 2 hours PTA her dog bit her left hand.  Sure of last tetanus.  Dog is up-to-date on his rabies series.  Patient with 2 puncture wounds to palmar aspect and 1.5 cm gaping laceration to dorsal aspect of left hand. Denies fever, chills, bleeding, drainage, paresthesias, decreased ROM to extremities, redness, swelling, warmth. Denies additional aggravating or alleviating factors.  History obtained from patient and past medical records. No interpretor was used.  HPI     Past Medical History:  Diagnosis Date  . Allergies   . Anxiety   . Arthritis   . Benign essential HTN 09/30/2015  . Bilateral swelling of feet   . Borderline hypertension   . Depression   . Fatty liver   . GERD (gastroesophageal reflux disease)   . H/O sinusitis 05/11/2015  . High serum parathyroid hormone (PTH) 08/18/2018  . History of chicken pox 05/11/2015  . History of chicken pox 05/11/2015  . Hx of endometriosis 03/28/2013  . Hyperlipidemia   . IBS (irritable bowel syndrome)   . Insomnia   . Kidney problem   . Knee pain   . Multiple sclerosis (HCC)   . PKD (polycystic kidney disease)   . Renal insufficiency 12/20/2013  . Seasonal allergies 05/11/2015  . Tear of lateral meniscus of right knee   . Vitamin D deficiency     Patient Active Problem List   Diagnosis Date Noted  . Aortic atherosclerosis (HCC) 12/05/2018  . Sinusitis 12/05/2018  . Restless sleeper 12/05/2018  . Snoring 12/05/2018  . Low vitamin D level 08/18/2018  . High serum parathyroid hormone (PTH) 08/18/2018  . Obesity 08/18/2018  . Benign essential HTN 09/30/2015  . History  of chicken pox 05/11/2015  . H/O sinusitis 05/11/2015  . Seasonal allergies 05/11/2015  . Elevated LFTs 02/06/2014  . Renal insufficiency 12/20/2013  . Bilateral renal cysts 12/20/2013  . Microscopic hematuria 12/20/2013  . Tear of lateral meniscus of right knee   . Insomnia 04/11/2013  . Anxiety 03/28/2013  . GERD (gastroesophageal reflux disease) 03/28/2013  . Renal calculi 03/28/2013  . Hyperlipidemia 03/28/2013  . IBS (irritable bowel syndrome) 03/28/2013  . Hx of endometriosis 03/28/2013  . S/P total hysterectomy and BSO (bilateral salpingo-oophorectomy) 03/28/2013  . Multiple sclerosis (HCC) 03/28/2013  . Diverticulosis 03/28/2013    Past Surgical History:  Procedure Laterality Date  . BLADDER SURGERY  2009   sling  . COLONOSCOPY    . Finger Joint Surgery    . KNEE ARTHROSCOPY WITH MEDIAL MENISECTOMY Right 08/15/2013   Procedure: RIGHT KNEE ARTHROSCOPY WITH DEBRIDEMENT/SHAVING (CHRONDRPLASTY), MEDIAL AND LATERAL MENISECTOMY;  Surgeon: Nilda Simmer, MD;  Location: [facility];  Service: Orthopedics;  Laterality: Right;  . NASAL SINUS SURGERY  2000  . planterfaciitis  1998   right  . RHINOPLASTY  1979  . UPPER GI ENDOSCOPY    . VAGINAL HYSTERECTOMY  2010  . WISDOM TOOTH EXTRACTION       OB History    Gravida  3   Para  2   Term      Preterm      AB  1  Living  2     SAB  1   TAB      Ectopic      Multiple      Live Births              Family History  Problem Relation Age of Onset  . Kidney disease Mother   . Osteoporosis Mother   . COPD Mother   . Hypertension Mother   . Hyperlipidemia Mother   . Obesity Mother   . Hypertension Father   . Cancer Father        throat  . Kidney disease Father        kidney stones and cancer  . Alzheimer's disease Father   . Hyperlipidemia Father   . Hypertension Sister   . Prostate cancer Maternal Grandfather   . Alzheimer's disease Paternal Grandmother   . Drug abuse Paternal  Grandmother   . Heart disease Paternal Grandfather   . Drug abuse Paternal Grandfather     Social History   Tobacco Use  . Smoking status: Never Smoker  . Smokeless tobacco: Never Used  Substance Use Topics  . Alcohol use: Yes    Comment: occ  . Drug use: No    Home Medications Prior to Admission medications   Medication Sig Start Date End Date Taking? Authorizing Provider  amoxicillin-clavulanate (AUGMENTIN) 875-125 MG tablet Take 1 tablet by mouth every 12 (twelve) hours for 7 days. 03/07/19 03/14/19  Anvi Mangal A, PA-C  Cholecalciferol (VITAMIN D3) 50 MCG (2000 UT) TABS Take by mouth 1 day or 1 dose. 1 per day for 6 days    [provider]  cloNIDine (CATAPRES) 0.2 MG tablet Take 0.2 mg by mouth 3 (three) times daily.     [provider]  Ferrous Sulfate (IRON SLOW RELEASE) 140 (45 Fe) MG TBCR Take by mouth.    [provider]  fexofenadine (ALLEGRA) 180 MG tablet Take 180 mg by mouth daily.    [provider]  FLUoxetine (PROZAC) 20 MG capsule 2 (two) times daily.  06/07/18   [provider]  LORazepam (ATIVAN) 1 MG tablet Take 1 tablet (1 mg total) by mouth 3 (three) times daily as needed for anxiety. Patient taking differently: Take 1 mg by mouth 2 (two) times daily.  07/09/15   Bradd Canary, MD  Vitamin D, Ergocalciferol, (DRISDOL) 1.25 MG (50000 UT) CAPS capsule TAKE 1 CAPSULE BY MOUTH EVERY 7 DAYS 01/10/19   Bradd Canary, MD  zolpidem (AMBIEN) 10 MG tablet Take 10 mg by mouth at bedtime as needed for sleep.    [provider]    Allergies    Augmentin [amoxicillin-pot clavulanate]  Review of Systems   Review of Systems  Constitutional: Negative.   HENT: Negative.   Respiratory: Negative.   Cardiovascular: Negative.   Gastrointestinal: Negative.   Genitourinary: Negative.   Musculoskeletal: Negative.   Skin: Positive for wound.  Neurological: Negative.   All other systems reviewed and are negative.    Physical Exam Updated Vital Signs BP (!) 149/93 (BP Location: Right Arm)   Pulse 77   Temp 98 F (36.7 C) (Oral)   Resp 18   Ht 5\' 4"  (1.626 m)   Wt 100.2 kg   SpO2 98%   BMI 37.93 kg/m   Physical Exam Vitals and nursing note reviewed.  Constitutional:      General: Emily Davenport is not in acute distress.    Appearance: Emily Davenport is well-developed. Emily Davenport is not  ill-appearing, toxic-appearing or diaphoretic.  HENT:     Head: Normocephalic and atraumatic.     Nose: Nose normal.     Mouth/Throat:     Mouth: Mucous membranes are moist.     Pharynx: Oropharynx is clear.  Eyes:     Pupils: Pupils are equal, round, and reactive to light.  Cardiovascular:     Rate and Rhythm: Normal rate.     Pulses: Normal pulses.     Heart sounds: Normal heart sounds.  Pulmonary:     Effort: Pulmonary effort is normal. No respiratory distress.     Breath sounds: Normal breath sounds.  Abdominal:     General: There is no distension.  Musculoskeletal:        General: Normal range of motion.     Right hand: Normal.     Left hand: Swelling, laceration and tenderness present. No deformity or bony tenderness. Normal range of motion. Normal strength. Normal sensation. There is no disruption of two-point discrimination. Normal capillary refill. Normal pulse.     Cervical back: Normal range of motion.     Comments: Compartments soft. No bony tenderness to palpation.  Skin:    General: Skin is warm and dry.     Capillary Refill: Capillary refill takes less than 2 seconds.     Comments: 2 puncture wounds to medial palmar aspect of left hand.  Patient with 1.5 cm, gaping laceration to dorsal aspect of left hand.  No visualized tendon, ligaments or musculature.  No bleeding or drainage.  No surrounding erythema or warmth.  Neurological:     General: No focal deficit present.     Mental Status: Emily Davenport is alert.     Sensory: Sensation is intact.     Motor: Motor function is intact.     Coordination: Coordination is intact.      Gait: Gait is intact.     Comments: Intact sensation to radial and ulnar aspect of left hand.         ED Results / Procedures / Treatments   Labs (all labs ordered are listed, but only abnormal results are displayed) Labs Reviewed - No data to display  EKG None  Radiology DG Hand Complete Left  Result Date: 03/07/2019 CLINICAL DATA:  Dog bite left hand third through fifth metacarpals EXAM: LEFT HAND - COMPLETE 3+ VIEW COMPARISON:  None. FINDINGS: Frontal, oblique, and lateral views of the left hand are obtained. Soft tissue swelling is seen ulnar aspect left hand and wrist. No radiopaque foreign bodies. No underlying fractures. There is significant osteoarthritis of the first metacarpophalangeal joint, with radial subluxation of the first proximal phalanx. IMPRESSION: 1. Soft tissue swelling ulnar aspect of the hand and wrist. No underlying fracture or radiopaque foreign body. 2. Osteoarthritis of the first metacarpal phalangeal joint. Electronically Signed   By: Randa Ngo M.D.   On: 03/07/2019 22:09    Procedures .Marland KitchenLaceration Repair  Date/Time: 03/07/2019 11:02 PM Performed by: Nettie Elm, PA-C Authorized by: Nettie Elm, PA-C   Consent:    Consent obtained:  Verbal   Consent given by:  Patient   Risks discussed:  Infection, need for additional repair, pain, poor cosmetic result and poor wound healing   Alternatives discussed:  No treatment and delayed treatment Universal protocol:    Procedure explained and questions answered to patient or proxy's satisfaction: yes     Relevant documents present and verified: yes     Test results available and properly labeled: yes  Imaging studies available: yes     Required blood products, implants, devices, and special equipment available: yes     Site/side marked: yes     Immediately prior to procedure, a time out was called: yes     Patient identity confirmed:  Verbally with patient Anesthesia (see MAR for  exact dosages):    Anesthesia method:  Local infiltration   Local anesthetic:  Lidocaine 2% WITH epi Laceration details:    Location:  Hand   Hand location:  L hand, dorsum   Length (cm):  1.5   Depth (mm):  4 Repair type:    Repair type:  Intermediate Pre-procedure details:    Preparation:  Patient was prepped and draped in usual sterile fashion and imaging obtained to evaluate for foreign bodies Exploration:    Hemostasis achieved with:  Direct pressure   Wound exploration: wound explored through full range of motion and entire depth of wound probed and visualized     Wound extent: no foreign bodies/material noted, no muscle damage noted, no nerve damage noted, no tendon damage noted, no underlying fracture noted and no vascular damage noted     Contaminated: no   Treatment:    Area cleansed with:  Betadine   Amount of cleaning:  Extensive   Irrigation volume:  1L   Irrigation method:  Pressure wash Skin repair:    Repair method:  Sutures   Suture size:  4-0   Suture material:  Prolene   Suture technique:  Simple interrupted   Number of sutures:  2 Approximation:    Approximation:  Loose Post-procedure details:    Dressing:  Non-adherent dressing   Patient tolerance of procedure:  Tolerated well, no immediate complications   (including critical care time)  Medications Ordered in ED Medications  Tdap (BOOSTRIX) injection 0.5 mL (0.5 mLs Intramuscular Given 03/07/19 2204)  lidocaine-EPINEPHrine (XYLOCAINE W/EPI) 2 %-1:200000 (PF) injection 10 mL (10 mLs Infiltration Given by Other 03/07/19 2204)  amoxicillin-clavulanate (AUGMENTIN) 875-125 MG per tablet 1 tablet (1 tablet Oral Given 03/07/19 2301)    ED Course  I have reviewed the triage vital signs and the nursing notes.  Pertinent labs & imaging results that were available during my care of the patient were reviewed by me and considered in my medical decision making (see chart for details).  59 year old female appears  otherwise well presents for evaluation of dog bite which occurred 2 hours PTA.  Patient with 2 puncture wounds to medial palmar aspect of left hand.  Patient with 1.5 cm, gaping laceration to dorsal aspect of left hand.  Neurovascularly intact.  Normal musculoskeletal exam.  No bony tenderness.  No evidence of retained foreign objects.  Will obtain x-rays to ensure no underlying fracture, retained foreign object.  Unknown last tetanus will update in ED.  Animal was patient's personal pet and is up-to-date on vaccines including rabies.  # 2  Loose Prolene sutures placed due to gaping of wound.  Patient given first dose of antibiotics instructed to follow-up outpatient with PCP.  Discussed wound care.-X-ray does not show any evidence of retained foreign object.  Low suspicion for acute infectious process, vascular, tendon or ligament injury.  Discussed suture home care with patient and answered questions. Pt to follow-up for wound check and suture removal in 7 days; they are to return to the ED sooner for signs of infection. Pt is hemodynamically stable with no complaints prior to dc.    MDM Rules/Calculators/A&P  Final Clinical Impression(s) / ED Diagnoses Final diagnoses:  Dog bite, initial encounter    Rx / DC Orders ED Discharge Orders         Ordered    amoxicillin-clavulanate (AUGMENTIN) 875-125 MG tablet  Every 12 hours     03/07/19 2300           Jerzey Komperda A, PA-C 03/07/19 2304    Arby Barrette, MD 03/18/19 1237

## 2019-03-07 NOTE — ED Notes (Signed)
Patient transported to X-ray 

## 2019-03-07 NOTE — ED Triage Notes (Signed)
Pt states her dog bit her left hand ~740p-dog is UTD on rabies-pt NAD-steady gait

## 2019-03-07 NOTE — Discharge Instructions (Addendum)
May take Imodium if you get diarrhea from the Augmentin   Can take Cipro and Clindamycin if unable to tolerate the Augmentin

## 2019-03-17 ENCOUNTER — Encounter: Payer: Self-pay | Admitting: Family Medicine

## 2019-03-18 ENCOUNTER — Other Ambulatory Visit: Payer: Self-pay | Admitting: Family Medicine

## 2019-03-18 DIAGNOSIS — I700 Atherosclerosis of aorta: Secondary | ICD-10-CM

## 2019-03-18 MED ORDER — ZOLPIDEM TARTRATE ER 12.5 MG PO TBCR: 12.5000 mg | EXTENDED_RELEASE_TABLET | Freq: Every evening | ORAL | 0 refills | Status: DC | PRN

## 2019-03-21 ENCOUNTER — Encounter: Payer: Self-pay | Admitting: Family Medicine

## 2019-03-21 ENCOUNTER — Other Ambulatory Visit: Payer: Self-pay | Admitting: Family Medicine

## 2019-03-22 ENCOUNTER — Other Ambulatory Visit: Payer: Self-pay | Admitting: Family Medicine

## 2019-03-22 ENCOUNTER — Ambulatory Visit (HOSPITAL_BASED_OUTPATIENT_CLINIC_OR_DEPARTMENT_OTHER): Payer: 59

## 2019-03-22 NOTE — Telephone Encounter (Signed)
Requesting:ativan  Contract:no UDS:n/a Last OV:12/03/18 Next OV:05/31/19 Last Refill:03/15/19  #[id number] Database:   Please advise

## 2019-03-23 ENCOUNTER — Other Ambulatory Visit: Payer: 59

## 2019-03-23 ENCOUNTER — Other Ambulatory Visit (HOSPITAL_BASED_OUTPATIENT_CLINIC_OR_DEPARTMENT_OTHER): Payer: Self-pay | Admitting: Nephrology

## 2019-03-23 DIAGNOSIS — N200 Calculus of kidney: Secondary | ICD-10-CM

## 2019-03-23 DIAGNOSIS — R809 Proteinuria, unspecified: Secondary | ICD-10-CM

## 2019-03-24 ENCOUNTER — Other Ambulatory Visit (INDEPENDENT_AMBULATORY_CARE_PROVIDER_SITE_OTHER): Payer: 59

## 2019-03-24 ENCOUNTER — Ambulatory Visit (HOSPITAL_BASED_OUTPATIENT_CLINIC_OR_DEPARTMENT_OTHER)
Admission: RE | Admit: 2019-03-24 | Discharge: 2019-03-24 | Disposition: A | Discharge status: Home / Self Care | Payer: 59 | Source: Ambulatory Visit | Attending: Nephrology | Admitting: Nephrology

## 2019-03-24 ENCOUNTER — Ambulatory Visit (HOSPITAL_BASED_OUTPATIENT_CLINIC_OR_DEPARTMENT_OTHER)
Admission: RE | Admit: 2019-03-24 | Discharge: 2019-03-24 | Disposition: A | Discharge status: Home / Self Care | Payer: 59 | Source: Ambulatory Visit | Attending: Family Medicine | Admitting: Family Medicine

## 2019-03-24 ENCOUNTER — Other Ambulatory Visit: Payer: Self-pay

## 2019-03-24 DIAGNOSIS — R809 Proteinuria, unspecified: Secondary | ICD-10-CM | POA: Diagnosis present

## 2019-03-24 DIAGNOSIS — R7989 Other specified abnormal findings of blood chemistry: Secondary | ICD-10-CM

## 2019-03-24 DIAGNOSIS — R7303 Prediabetes: Secondary | ICD-10-CM | POA: Diagnosis not present

## 2019-03-24 DIAGNOSIS — I10 Essential (primary) hypertension: Secondary | ICD-10-CM

## 2019-03-24 DIAGNOSIS — N200 Calculus of kidney: Secondary | ICD-10-CM

## 2019-03-24 DIAGNOSIS — I700 Atherosclerosis of aorta: Secondary | ICD-10-CM | POA: Insufficient documentation

## 2019-03-24 LAB — COMPREHENSIVE METABOLIC PANEL
ALT: 52 U/L — ABNORMAL HIGH (ref 0–35)
AST: 34 U/L (ref 0–37)
Albumin: 4 g/dL (ref 3.5–5.2)
Alkaline Phosphatase: 121 U/L — ABNORMAL HIGH (ref 39–117)
BUN: 17 mg/dL (ref 6–23)
CO2: 26 mEq/L (ref 19–32)
Calcium: 10 mg/dL (ref 8.4–10.5)
Chloride: 104 mEq/L (ref 96–112)
Creatinine, Ser: 0.93 mg/dL (ref 0.40–1.20)
GFR: 61.78 mL/min (ref >60.00)
Glucose, Bld: 116 mg/dL — ABNORMAL HIGH (ref 70–99)
Potassium: 4.1 mEq/L (ref 3.5–5.1)
Sodium: 139 mEq/L (ref 135–145)
Total Bilirubin: 0.5 mg/dL (ref 0.2–1.2)
Total Protein: 7.3 g/dL (ref 6.0–8.3)

## 2019-03-24 LAB — LIPID PANEL
Cholesterol: 220 mg/dL — ABNORMAL HIGH (ref 0–200)
HDL: 47.4 mg/dL (ref >39.00)
LDL Cholesterol: 141 mg/dL — ABNORMAL HIGH (ref 0–99)
NonHDL: 172.6
Total CHOL/HDL Ratio: 5
Triglycerides: 157 mg/dL — ABNORMAL HIGH (ref 0.0–149.0)
VLDL: 31.4 mg/dL (ref 0.0–40.0)

## 2019-03-24 LAB — CBC
HCT: 41.4 % (ref 36.0–46.0)
Hemoglobin: 13.9 g/dL (ref 12.0–15.0)
MCHC: 33.6 g/dL (ref 30.0–36.0)
MCV: 84.7 fl (ref 78.0–100.0)
Platelets: 298 10*3/uL (ref 150.0–400.0)
RBC: 4.89 Mil/uL (ref 3.87–5.11)
RDW: 13.4 % (ref 11.5–15.5)
WBC: 9.1 10*3/uL (ref 4.0–10.5)

## 2019-03-24 LAB — HEMOGLOBIN A1C: Hgb A1c MFr Bld: 5.9 % (ref 4.6–6.5)

## 2019-03-24 LAB — TSH: TSH: 0.93 u[IU]/mL (ref 0.35–4.50)

## 2019-03-24 LAB — VITAMIN D 25 HYDROXY (VIT D DEFICIENCY, FRACTURES): VITD: 34 ng/mL (ref 30.00–100.00)

## 2019-03-25 LAB — PARATHYROID HORMONE, INTACT (NO CA): PTH: 42 pg/mL (ref 14–64)

## 2019-03-25 NOTE — Addendum Note (Signed)
Addended by: Crissie Sickles A on: 03/25/2019 10:39 AM   Modules accepted: Orders

## 2019-03-30 DIAGNOSIS — S62347B Nondisplaced fracture of base of fifth metacarpal bone. left hand, initial encounter for open fracture: Secondary | ICD-10-CM | POA: Insufficient documentation

## 2019-03-30 DIAGNOSIS — T148XXA Other injury of unspecified body region, initial encounter: Secondary | ICD-10-CM | POA: Insufficient documentation

## 2019-04-01 ENCOUNTER — Telehealth: Payer: Self-pay | Admitting: Family Medicine

## 2019-04-01 NOTE — Telephone Encounter (Signed)
Error

## 2019-04-19 ENCOUNTER — Encounter: Payer: Self-pay | Admitting: Family Medicine

## 2019-04-19 MED ORDER — ZOLPIDEM TARTRATE ER 12.5 MG PO TBCR: 12.5000 mg | EXTENDED_RELEASE_TABLET | Freq: Every evening | ORAL | 0 refills | Status: DC | PRN

## 2019-04-19 MED ORDER — CLONIDINE HCL 0.2 MG PO TABS: 0.2000 mg | ORAL_TABLET | Freq: Three times a day (TID) | ORAL | 3 refills | Status: DC

## 2019-04-19 NOTE — Telephone Encounter (Signed)
Requesting:ambien  Contract:no UDS:n/a Last OV:12/03/18 Next OV:05/31/19 Last Refill:03/18/19  #[id number] Database:   Please advise \

## 2019-04-20 MED ORDER — CLONIDINE HCL 0.2 MG PO TABS: 0.2000 mg | ORAL_TABLET | Freq: Three times a day (TID) | ORAL | 1 refills | Status: DC

## 2019-04-21 ENCOUNTER — Encounter: Payer: Self-pay | Admitting: Family Medicine

## 2019-04-27 ENCOUNTER — Other Ambulatory Visit (HOSPITAL_BASED_OUTPATIENT_CLINIC_OR_DEPARTMENT_OTHER): Payer: Self-pay

## 2019-04-27 DIAGNOSIS — R0683 Snoring: Secondary | ICD-10-CM

## 2019-05-20 ENCOUNTER — Other Ambulatory Visit: Payer: Self-pay | Admitting: Family Medicine

## 2019-05-20 NOTE — Telephone Encounter (Signed)
Last written: 04/19/2019 Last ov: 12/03/18 Next ov: 05/31/2019 Contract: none UDS: none

## 2019-05-31 ENCOUNTER — Ambulatory Visit: Payer: 59 | Admitting: Family Medicine

## 2019-06-09 ENCOUNTER — Other Ambulatory Visit: Payer: Self-pay

## 2019-06-09 ENCOUNTER — Telehealth (INDEPENDENT_AMBULATORY_CARE_PROVIDER_SITE_OTHER): Payer: 59 | Admitting: Family Medicine

## 2019-06-09 DIAGNOSIS — R7989 Other specified abnormal findings of blood chemistry: Secondary | ICD-10-CM

## 2019-06-09 DIAGNOSIS — I10 Essential (primary) hypertension: Secondary | ICD-10-CM | POA: Diagnosis not present

## 2019-06-09 DIAGNOSIS — N289 Disorder of kidney and ureter, unspecified: Secondary | ICD-10-CM

## 2019-06-09 DIAGNOSIS — R3129 Other microscopic hematuria: Secondary | ICD-10-CM

## 2019-06-09 DIAGNOSIS — E669 Obesity, unspecified: Secondary | ICD-10-CM

## 2019-06-09 DIAGNOSIS — K219 Gastro-esophageal reflux disease without esophagitis: Secondary | ICD-10-CM

## 2019-06-09 MED ORDER — CLONIDINE HCL 0.1 MG PO TABS: 0.1000 mg | ORAL_TABLET | Freq: Three times a day (TID) | ORAL | 3 refills | Status: DC

## 2019-06-09 MED ORDER — METOPROLOL SUCCINATE ER 25 MG PO TB24: 25.0000 mg | ORAL_TABLET | Freq: Every day | ORAL | 3 refills | Status: DC

## 2019-06-09 MED ORDER — FAMOTIDINE 40 MG PO TABS: 40.0000 mg | ORAL_TABLET | Freq: Every evening | ORAL | 5 refills | Status: DC | PRN

## 2019-06-10 ENCOUNTER — Telehealth: Payer: Self-pay

## 2019-06-10 NOTE — Telephone Encounter (Signed)
PA denied. Diagnosis is plan exclusion. (GERD).

## 2019-06-10 NOTE — Telephone Encounter (Signed)
PA initiated via Covermymeds; KEY: [id number]. Awaiting determination.

## 2019-06-12 NOTE — Assessment & Plan Note (Signed)
Has been evalauted by [facility], Dr Retta Diones. Cystoscopy showed no specific lesions but some questionable debris. She has also been seeing nephrology. She was noting some smell to her urine and now with antibiotics the smell is gone.

## 2019-06-12 NOTE — Assessment & Plan Note (Signed)
Worse with weight gain. Avoid offending foods, start probiotics. Do not eat large meals in late evening and consider raising head of bed. Famotidine prescribed

## 2019-06-12 NOTE — Assessment & Plan Note (Signed)
Continues to struggle. Consider the MIND diet and stay as active as able. Referred to bariatric surgeon for consideration

## 2019-06-12 NOTE — Assessment & Plan Note (Signed)
Supplement and monitor 

## 2019-06-12 NOTE — Progress Notes (Signed)
Virtual Visit via Video Note  I connected with Emily Davenport on 06/09/19 at  2:40 PM EDT by a video enabled telemedicine application and verified that I am speaking with the correct person using two identifiers.  Location: Patient: home, patient was in the visit Provider: office, physician was in the visit   I discussed the limitations of evaluation and management by telemedicine and the availability of in person appointments. The patient expressed understanding and agreed to proceed. Thelma Barge, CMA was able to get the patient setup on a video visit.    Subjective:    Patient ID: Emily Davenport, female    DOB: [birth year], 59 y.o.   MRN: [id number]  Chief Complaint  Patient presents with  . Follow-up    HPI Patient is in today for follow up on chronic medical concerns or hospitalizations. She is struggling with increased reflux as she she has gained weight again. She is frustrated with her weight gain. Denies CP/palp/SOB/HA/congestion/fevers or GU c/o. Taking meds as prescribed. She has been seen by urology and nephrology and a course of antibiotics helped with the smell of urine.   Past Medical History:  Diagnosis Date  . Allergies   . Anxiety   . Arthritis   . Benign essential HTN 09/30/2015  . Bilateral swelling of feet   . Borderline hypertension   . Depression   . Fatty liver   . GERD (gastroesophageal reflux disease)   . H/O sinusitis 05/11/2015  . High serum parathyroid hormone (PTH) 08/18/2018  . History of chicken pox 05/11/2015  . History of chicken pox 05/11/2015  . Hx of endometriosis 03/28/2013  . Hyperlipidemia   . IBS (irritable bowel syndrome)   . Insomnia   . Kidney problem   . Knee pain   . Multiple sclerosis (HCC)   . PKD (polycystic kidney disease)   . Renal insufficiency 12/20/2013  . Seasonal allergies 05/11/2015  . Tear of lateral meniscus of right knee   . Vitamin D deficiency     Past Surgical History:  Procedure Laterality Date  .  BLADDER SURGERY  2009   sling  . COLONOSCOPY    . Finger Joint Surgery    . KNEE ARTHROSCOPY WITH MEDIAL MENISECTOMY Right 08/15/2013   Procedure: RIGHT KNEE ARTHROSCOPY WITH DEBRIDEMENT/SHAVING (CHRONDRPLASTY), MEDIAL AND LATERAL MENISECTOMY;  Surgeon: Nilda Simmer, MD;  Location: [facility];  Service: Orthopedics;  Laterality: Right;  . NASAL SINUS SURGERY  2000  . planterfaciitis  1998   right  . RHINOPLASTY  1979  . UPPER GI ENDOSCOPY    . VAGINAL HYSTERECTOMY  2010  . WISDOM TOOTH EXTRACTION      Family History  Problem Relation Age of Onset  . Kidney disease Mother   . Osteoporosis Mother   . COPD Mother   . Hypertension Mother   . Hyperlipidemia Mother   . Obesity Mother   . Hypertension Father   . Cancer Father        throat  . Kidney disease Father        kidney stones and cancer  . Alzheimer's disease Father   . Hyperlipidemia Father   . Hypertension Sister   . Prostate cancer Maternal Grandfather   . Alzheimer's disease Paternal Grandmother   . Drug abuse Paternal Grandmother   . Heart disease Paternal Grandfather   . Drug abuse Paternal Grandfather     Social History   Socioeconomic History  . Marital status: Married  Spouse name: Not on file  . Number of children: Not on file  . Years of education: Not on file  . Highest education level: Not on file  Occupational History  . Not on file  Tobacco Use  . Smoking status: Never Smoker  . Smokeless tobacco: Never Used  Substance and Sexual Activity  . Alcohol use: Yes    Comment: occ  . Drug use: No  . Sexual activity: Not on file  Other Topics Concern  . Not on file  Social History Narrative   Retired Hotel manager, lives with husband   Social Determinants of Corporate investment banker Strain:   . Difficulty of Paying Living Expenses:   Food Insecurity:   . Worried About Programme researcher, broadcasting/film/video in the Last Year:   . Barista in the Last Year:    Transportation Needs:   . Freight forwarder (Medical):   Marland Kitchen Lack of Transportation (Non-Medical):   Physical Activity:   . Days of Exercise per Week:   . Minutes of Exercise per Session:   Stress:   . Feeling of Stress :   Social Connections:   . Frequency of Communication with Friends and Family:   . Frequency of Social Gatherings with Friends and Family:   . Attends Religious Services:   . Active Member of Clubs or Organizations:   . Attends Banker Meetings:   Marland Kitchen Marital Status:   Intimate Partner Violence:   . Fear of Current or Ex-Partner:   . Emotionally Abused:   Marland Kitchen Physically Abused:   . Sexually Abused:     Outpatient Medications Prior to Visit  Medication Sig Dispense Refill  . aspirin EC 81 MG tablet Take 81 mg by mouth daily.    . Cholecalciferol (VITAMIN D3) 50 MCG (2000 UT) TABS Take by mouth 1 day or 1 dose. 1 per day for 6 days    . Ferrous Sulfate (IRON SLOW RELEASE) 140 (45 Fe) MG TBCR Take by mouth.    . fexofenadine (ALLEGRA) 180 MG tablet Take 180 mg by mouth daily.    Marland Kitchen KRILL OIL PO Take 1 capsule by mouth daily.    Marland Kitchen LORazepam (ATIVAN) 1 MG tablet Take 1 tablet (1 mg total) by mouth 2 (two) times daily as needed for anxiety. 60 tablet 3  . Vitamin D, Ergocalciferol, (DRISDOL) 1.25 MG (50000 UT) CAPS capsule TAKE 1 CAPSULE BY MOUTH EVERY 7 DAYS 4 capsule 4  . zolpidem (AMBIEN CR) 12.5 MG CR tablet TAKE 1 TABLET BY MOUTH EACH NIGHT AT BEDTIME AS NEEDED FOR SLEEP 30 tablet 1  . cloNIDine (CATAPRES) 0.2 MG tablet Take 1 tablet (0.2 mg total) by mouth 3 (three) times daily. 270 tablet 1  . FLUoxetine (PROZAC) 20 MG capsule 2 (two) times daily.      No facility-administered medications prior to visit.    Allergies  Allergen Reactions  . Augmentin [Amoxicillin-Pot Clavulanate]     diarrhea    Review of Systems  Constitutional: Positive for malaise/fatigue. Negative for fever.  HENT: Negative for congestion.   Eyes: Negative for blurred  vision.  Respiratory: Negative for shortness of breath.   Cardiovascular: Negative for chest pain, palpitations and leg swelling.  Gastrointestinal: Positive for heartburn. Negative for abdominal pain, blood in stool and nausea.  Genitourinary: Negative for dysuria and frequency.  Musculoskeletal: Negative for falls.  Skin: Negative for rash.  Neurological: Negative for dizziness, loss of consciousness and headaches.  Endo/Heme/Allergies: Negative for environmental allergies.  Psychiatric/Behavioral: Negative for depression. The patient is not nervous/anxious.        Objective:    Physical Exam Constitutional:      Appearance: Normal appearance. She is obese. She is not ill-appearing.  HENT:     Head: Normocephalic and atraumatic.     Right Ear: External ear normal.     Left Ear: External ear normal.     Nose: Nose normal.  Eyes:     General:        Right eye: No discharge.        Left eye: No discharge.  Pulmonary:     Effort: Pulmonary effort is normal.  Neurological:     Mental Status: She is alert and oriented to person, place, and time.  Psychiatric:        Behavior: Behavior normal.     There were no vitals taken for this visit. Wt Readings from Last 3 Encounters:  03/07/19 221 lb (100.2 kg)  12/03/18 210 lb (95.3 kg)  10/12/18 210 lb (95.3 kg)    Diabetic Foot Exam - Simple   No data filed     Lab Results  Component Value Date   WBC 9.1 03/24/2019   HGB 13.9 03/24/2019   HCT 41.4 03/24/2019   PLT 298.0 03/24/2019   GLUCOSE 116 (H) 03/24/2019   CHOL 220 (H) 03/24/2019   TRIG 157.0 (H) 03/24/2019   HDL 47.40 03/24/2019   LDLCALC 141 (H) 03/24/2019   ALT 52 (H) 03/24/2019   AST 34 03/24/2019   NA 139 03/24/2019   K 4.1 03/24/2019   CL 104 03/24/2019   CREATININE 0.93 03/24/2019   BUN 17 03/24/2019   CO2 26 03/24/2019   TSH 0.93 03/24/2019   HGBA1C 5.9 03/24/2019    Lab Results  Component Value Date   TSH 0.93 03/24/2019   Lab Results   Component Value Date   WBC 9.1 03/24/2019   HGB 13.9 03/24/2019   HCT 41.4 03/24/2019   MCV 84.7 03/24/2019   PLT 298.0 03/24/2019   Lab Results  Component Value Date   NA 139 03/24/2019   K 4.1 03/24/2019   CO2 26 03/24/2019   GLUCOSE 116 (H) 03/24/2019   BUN 17 03/24/2019   CREATININE 0.93 03/24/2019   BILITOT 0.5 03/24/2019   ALKPHOS 121 (H) 03/24/2019   AST 34 03/24/2019   ALT 52 (H) 03/24/2019   PROT 7.3 03/24/2019   ALBUMIN 4.0 03/24/2019   CALCIUM 10.0 03/24/2019   GFR 61.78 03/24/2019   Lab Results  Component Value Date   CHOL 220 (H) 03/24/2019   Lab Results  Component Value Date   HDL 47.40 03/24/2019   Lab Results  Component Value Date   LDLCALC 141 (H) 03/24/2019   Lab Results  Component Value Date   TRIG 157.0 (H) 03/24/2019   Lab Results  Component Value Date   CHOLHDL 5 03/24/2019   Lab Results  Component Value Date   HGBA1C 5.9 03/24/2019       Assessment & Plan:   Problem List Items Addressed This Visit    GERD (gastroesophageal reflux disease)    Worse with weight gain. Avoid offending foods, start probiotics. Do not eat large meals in late evening and consider raising head of bed. Famotidine prescribed      Relevant Medications   famotidine (PEPCID) 40 MG tablet   Renal insufficiency    Hydrate and monitor, following with Dr Hyman Hopes  Microscopic hematuria    Has been evalauted by [facility], Dr Diona Fanti. Cystoscopy showed no specific lesions but some questionable debris. She has also been seeing nephrology. She was noting some smell to her urine and now with antibiotics the smell is gone.       Benign essential HTN    Monitor and report any concerns, no changes to meds. Encouraged heart healthy diet such as the DASH diet and exercise as tolerated. Transition from Clonidine to Metoprolol as directed.       Relevant Medications   aspirin EC 81 MG tablet   metoprolol succinate (TOPROL-XL) 25 MG 24 hr tablet   cloNIDine  (CATAPRES) 0.1 MG tablet   Low vitamin D level    Supplement and monitor      Obesity    Continues to struggle. Consider the MIND diet and stay as active as able. Referred to bariatric surgeon for consideration       Other Visit Diagnoses    Morbid obesity ([place])    -  Primary   Relevant Orders   Amb Referral to Bariatric Surgery   Hypertension, unspecified type       Relevant Medications   aspirin EC 81 MG tablet   metoprolol succinate (TOPROL-XL) 25 MG 24 hr tablet   cloNIDine (CATAPRES) 0.1 MG tablet      I have discontinued Shayra S. Marchiano's FLUoxetine and cloNIDine. I am also having her start on famotidine, metoprolol succinate, and cloNIDine. Additionally, I am having her maintain her fexofenadine, Vitamin D3, Ferrous Sulfate, Vitamin D (Ergocalciferol), LORazepam, zolpidem, KRILL OIL PO, and aspirin EC.  Meds ordered this encounter  Medications  . famotidine (PEPCID) 40 MG tablet    Sig: Take 1 tablet (40 mg total) by mouth at bedtime as needed for heartburn or indigestion.    Dispense:  30 tablet    Refill:  5  . metoprolol succinate (TOPROL-XL) 25 MG 24 hr tablet    Sig: Take 1 tablet (25 mg total) by mouth daily.    Dispense:  90 tablet    Refill:  3  . cloNIDine (CATAPRES) 0.1 MG tablet    Sig: Take 1 tablet (0.1 mg total) by mouth 3 (three) times daily.    Dispense:  90 tablet    Refill:  3        I discussed the assessment and treatment plan with the patient. The patient was provided an opportunity to ask questions and all were answered. The patient agreed with the plan and demonstrated an understanding of the instructions.   The patient was advised to call back or seek an in-person evaluation if the symptoms worsen or if the condition fails to improve as anticipated.  I provided 20 minutes of non-face-to-face time during this encounter.   Penni Homans, MD

## 2019-06-12 NOTE — Assessment & Plan Note (Signed)
Hydrate and monitor, following with Dr Hyman Hopes

## 2019-06-12 NOTE — Assessment & Plan Note (Addendum)
Monitor and report any concerns, no changes to meds. Encouraged heart healthy diet such as the DASH diet and exercise as tolerated. Transition from Clonidine to Metoprolol as directed.

## 2019-06-17 ENCOUNTER — Encounter: Payer: Self-pay | Admitting: Family Medicine

## 2019-06-20 ENCOUNTER — Other Ambulatory Visit: Payer: Self-pay | Admitting: Family Medicine

## 2019-06-28 ENCOUNTER — Other Ambulatory Visit: Payer: 59

## 2019-07-08 ENCOUNTER — Other Ambulatory Visit: Payer: Self-pay

## 2019-07-08 ENCOUNTER — Other Ambulatory Visit: Payer: Self-pay | Admitting: Family Medicine

## 2019-07-08 ENCOUNTER — Ambulatory Visit (HOSPITAL_BASED_OUTPATIENT_CLINIC_OR_DEPARTMENT_OTHER): Payer: 59 | Attending: Otolaryngology | Admitting: Internal Medicine

## 2019-07-08 DIAGNOSIS — R0683 Snoring: Secondary | ICD-10-CM

## 2019-07-08 DIAGNOSIS — G4733 Obstructive sleep apnea (adult) (pediatric): Secondary | ICD-10-CM | POA: Insufficient documentation

## 2019-07-08 NOTE — Telephone Encounter (Signed)
Last written:  Lorazepam 03/22/19 zolpidem 05/20/19 Last ov: 06/09/19  Next ov: 08/18/19 Contract:  UDS:

## 2019-07-11 NOTE — Telephone Encounter (Signed)
Covering refill request for Dr. Abner Greenspan today 06/20/2019  1   05/20/2019  Zolpidem Tart Er 12.5 MG Tab  30.00  30 [facility]   [id number]   Ple ([id number])   1  0.62 LME  Private Pay   [place]  06/20/2019  1   03/22/2019  Lorazepam 1 MG Tablet  60.00  30 [facility]   [id number]   [facility] ([id number])   3  2.00 LME  Comm Ins   [place]  05/21/2019  1   05/20/2019  Zolpidem Tart Er 12.5 MG Tab  30.00  30 [facility]   [id number]   [facility] ([id number])   0  0.62 LME  Private Pay   [place]  05/20/2019  1   03/22/2019  Lorazepam 1 MG Tablet  60.00  30 [facility]   [id number]   [facility]le ([id number])   2  2.00 LME  Comm Ins   [place]  04/21/2019  1   04/19/2019  Zolpidem Tart Er 12.5 MG Tab  30.00  30 [facility]   [id number]   Ple ([id number])   0  0.62 LME  Private Pay   [place]  04/19/2019  1   03/22/2019  Lorazepam 1 MG Tablet  60.00  30 [facility]   [id number]   [facility] ([id number])   1  2.00 LME  Comm Ins   [place]  03/24/2019  1   09/30/2018  Zolpidem Tart Er 12.5 MG Tab  30.00  30 [place]   [id number]   Ple ([id number])   5  0.62 LME  Private Pay   [place]  03/22/2019  1   03/22/2019  Lorazepam 1 MG Tablet  60.00  30 [facility]   [id number]   Ple ([id number])   0  2.00 LME  Comm Ins   [place]  02/23/2019  1   09/30/2018  Zolpidem Tart Er 12.5 MG Tab  30.00  30 [place]   [id number]   Ple ([id number])   4  0.62 LME  Private Pay   

## 2019-07-14 ENCOUNTER — Telehealth: Payer: Self-pay | Admitting: Internal Medicine

## 2019-07-14 NOTE — Telephone Encounter (Signed)
Patient contacted, message left. Home sleep study results are not available at this time. Patient advised we will call her as soon as the provider reviews her study.

## 2019-07-18 ENCOUNTER — Encounter: Payer: Self-pay | Admitting: Family Medicine

## 2019-07-27 ENCOUNTER — Encounter: Payer: Self-pay | Admitting: Family Medicine

## 2019-07-27 DIAGNOSIS — R0683 Snoring: Secondary | ICD-10-CM | POA: Diagnosis not present

## 2019-07-27 NOTE — Procedures (Signed)
   Patient Name: Emily, Davenport Date: 07/10/2019 Gender: Female D.O.B: [birth year] Age (years): 58 Referring Provider: Flo Shanks Height (inches): 64 Interpreting Physician: Jetty Duhamel MD, ABSM Weight (lbs): 225 RPSGT: [place] Sink BMI: 39 MRN: [id number] Neck Size: 14.50  CLINICAL INFORMATION Sleep Study Type: HST Indication for sleep study: OSA Epworth Sleepiness Score: 5  SLEEP STUDY TECHNIQUE A multi-channel overnight portable sleep study was performed. The channels recorded were: nasal airflow, thoracic respiratory movement, and oxygen saturation with a pulse oximetry. Snoring was also monitored.  MEDICATIONS Patient self administered medications include: none reported.  SLEEP ARCHITECTURE Patient was studied for 376.1 minutes. The sleep efficiency was 99.3 % and the patient was supine for 99.1%. The arousal index was 0.0 per hour.  RESPIRATORY PARAMETERS The overall AHI was 8.8 per hour, with a central apnea index of 0.0 per hour. The oxygen nadir was 88% during sleep.  CARDIAC DATA Mean heart rate during sleep was 58.8 bpm.  IMPRESSIONS - Mild obstructive sleep apnea occurred during this study (AHI = 8.8/h). - No significant central sleep apnea occurred during this study (CAI = 0.0/h). - Mild oxygen desaturation was noted during this study (Min O2 = 88%). - Patient snored.  DIAGNOSIS - Obstructive Sleep Apnea (327.23 [G47.33 ICD-10])  RECOMMENDATIONS - Treatment for mild OSA is directed at symptoms. Conservative measures may include observation, weight loss and sleep position off back. - Other options, including CPAP, a fitted oral appliance, or ENT evaluation, would be based on clinical judgment. - Be careful with alcohol, sedatives and other CNS depressants that may worsen sleep apnea and disrupt normal sleep architecture. - Sleep hygiene should be reviewed to assess factors that may improve sleep quality. - Weight management and regular  exercise should be initiated or continued.  [Electronically signed] 07/27/2019 02:56 PM  Jetty Duhamel MD, ABSM Diplomate, American Board of Sleep Medicine   NPI: [id number]                          Jetty Duhamel Diplomate, American Board of Sleep Medicine  ELECTRONICALLY SIGNED ON:  07/27/2019, 2:53 PM [facility] PH: [phone]   FX: [phone] ACCREDITED BY THE AMERICAN ACADEMY OF SLEEP MEDICINE

## 2019-08-03 ENCOUNTER — Telehealth: Payer: Self-pay | Admitting: Family Medicine

## 2019-08-03 NOTE — Telephone Encounter (Signed)
Per Alvino Chapel, With [facility] Patient would like Dr Abner Greenspan to know that she has completed her sleep study.

## 2019-08-03 NOTE — Telephone Encounter (Signed)
FYI

## 2019-08-03 NOTE — Telephone Encounter (Signed)
Noted still no report present

## 2019-08-11 ENCOUNTER — Other Ambulatory Visit: Payer: 59

## 2019-08-18 ENCOUNTER — Ambulatory Visit: Payer: 59 | Admitting: Family Medicine

## 2019-09-01 ENCOUNTER — Encounter: Payer: Self-pay | Admitting: Family Medicine

## 2019-09-02 ENCOUNTER — Other Ambulatory Visit: Payer: Self-pay | Admitting: Family Medicine

## 2019-09-02 MED ORDER — METOPROLOL SUCCINATE ER 50 MG PO TB24: 75.0000 mg | ORAL_TABLET | Freq: Every day | ORAL | 1 refills | Status: DC

## 2019-09-06 DIAGNOSIS — Z87442 Personal history of urinary calculi: Secondary | ICD-10-CM | POA: Insufficient documentation

## 2019-09-06 DIAGNOSIS — N390 Urinary tract infection, site not specified: Secondary | ICD-10-CM | POA: Insufficient documentation

## 2019-09-06 DIAGNOSIS — I10 Essential (primary) hypertension: Secondary | ICD-10-CM | POA: Insufficient documentation

## 2019-09-14 ENCOUNTER — Other Ambulatory Visit: Payer: Self-pay

## 2019-09-14 ENCOUNTER — Encounter: Payer: Self-pay | Admitting: Pulmonary Disease

## 2019-09-14 ENCOUNTER — Ambulatory Visit (INDEPENDENT_AMBULATORY_CARE_PROVIDER_SITE_OTHER): Payer: 59 | Admitting: Pulmonary Disease

## 2019-09-14 ENCOUNTER — Other Ambulatory Visit: Payer: Self-pay | Admitting: Family Medicine

## 2019-09-14 VITALS — BP 126/76 | HR 83 | Temp 98.8°F | Ht 63.0 in | Wt 237.3 lb

## 2019-09-14 DIAGNOSIS — F5101 Primary insomnia: Secondary | ICD-10-CM

## 2019-09-14 DIAGNOSIS — G4733 Obstructive sleep apnea (adult) (pediatric): Secondary | ICD-10-CM | POA: Diagnosis not present

## 2019-09-14 NOTE — Assessment & Plan Note (Signed)
The pathophysiology of obstructive sleep apnea , it's cardiovascular consequences & modes of treatment including CPAP/oral appliance were discused with the patient in detail   She only has mild OSA and as such the cardiovascular implications are debatable.  But she does have significant fatigue and has been unable to lose weight.  We discussed trial of CPAP to see if symptoms would be improved and facilitate weight loss.  She appears to be a mouth breather so will likely need a full facemask initiation.  If she has mask interface issues, then may need a visit to the sleep center for desensitization or formal CPAP titration study. She was willing to undertake this therapeutic trial

## 2019-09-14 NOTE — Patient Instructions (Signed)
  You have mild obstructive sleep apnea , events were 9/hour We decided on trial of CPAP machine. We will send prescription to DME for auto CPAP 5 to 12 cm , full facemask  Call us for issues

## 2019-09-14 NOTE — Assessment & Plan Note (Signed)
Longstanding insomnia maintained on Ambien and on lorazepam for anxiety

## 2019-09-14 NOTE — Progress Notes (Signed)
Subjective:    Patient ID: Emily Davenport, female    DOB: October 13, 1960, 59 y.o.   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 She reports longstanding insomnia for which she took [place] for more than 15 years and now takes Ambien CR for the last 3 years.  She also takes Ativan 1 mg twice daily for anxiety. She has chronic sinusitis and undergone multiple sinus surgeries x4 last in 2004, saw Dr. Lazarus Salines before he retired, reported snoring and home sleep test was ordered.  She had a recent sinus infection requiring antibiotics and prednisone Snoring unfortunately is loud and she has had to move out of the bedroom .  She reports some level of excessive daytime fatigue although reports Epworth sleepiness score to be 2. Bedtime is between 10 PM and midnight, sleep latency can be up to 45 minutes, she sleeps on her side with 3 pillows including a wedge due to longstanding reflux.  She reports 3-5 nocturnal awakenings but denies nocturia and is out of bed by 9 AM still feeling tired, denies dryness of mouth or headache. She has gained 15 pounds in the last 2 years  She has a history of TMJ pain and used a mouthguard remotely.  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  There is no history suggestive of cataplexy, sleep paralysis or parasomnias   Significant tests/ events reviewed  HST 06/2019 showed mild OSA, AHI 9/hour, predominantly supine position   Past Medical History:  Diagnosis Date  . Allergies   . Anxiety   . Arthritis   . Benign essential HTN 09/30/2015  . Bilateral swelling of feet   . Borderline hypertension   . Depression   . Fatty liver   . GERD (gastroesophageal reflux disease)   . H/O sinusitis 05/11/2015  . High serum parathyroid hormone (PTH) 08/18/2018  . History of chicken pox 05/11/2015  . History of chicken pox  05/11/2015  . Hx of endometriosis 03/28/2013  . Hyperlipidemia   . IBS (irritable bowel syndrome)   . Insomnia   . Kidney problem   . Knee pain   . Multiple sclerosis (HCC)   . PKD (polycystic kidney disease)   . Renal insufficiency 12/20/2013  . Seasonal allergies 05/11/2015  . Tear of lateral meniscus of right knee   . Vitamin D deficiency    Past Surgical History:  Procedure Laterality Date  . BLADDER SURGERY  2009   sling  . COLONOSCOPY    . Finger Joint Surgery    . KNEE ARTHROSCOPY WITH MEDIAL MENISECTOMY Right 08/15/2013   Procedure: RIGHT KNEE ARTHROSCOPY WITH DEBRIDEMENT/SHAVING (CHRONDRPLASTY), MEDIAL AND LATERAL MENISECTOMY;  Surgeon: Nilda Simmer, MD;  Location: [facility];  Service: Orthopedics;  Laterality: Right;  . NASAL SINUS SURGERY  2000  . planterfaciitis  1998   right  . RHINOPLASTY  1979  . UPPER GI ENDOSCOPY    . VAGINAL HYSTERECTOMY  2010  . WISDOM TOOTH EXTRACTION      Allergies  Allergen Reactions  . Augmentin [Amoxicillin-Pot Clavulanate]     diarrhea    Social History   Socioeconomic History  . Marital status: Married    Spouse name: Not on file  . Number of children: Not on file  . Years of education: Not on file  . Highest education level: Not on file  Occupational  History  . Not on file  Tobacco Use  . Smoking status: Never Smoker  . Smokeless tobacco: Never Used  Vaping Use  . Vaping Use: Never used  Substance and Sexual Activity  . Alcohol use: Yes    Comment: occ  . Drug use: No  . Sexual activity: Not on file  Other Topics Concern  . Not on file  Social History Narrative   Retired Hotel manager, lives with husband   Social Determinants of Corporate investment banker Strain:   . Difficulty of Paying Living Expenses: Not on file  Food Insecurity:   . Worried About Programme researcher, broadcasting/film/video in the Last Year: Not on file  . Ran Out of Food in the Last Year: Not on file  Transportation Needs:   .  Lack of Transportation (Medical): Not on file  . Lack of Transportation (Non-Medical): Not on file  Physical Activity:   . Days of Exercise per Week: Not on file  . Minutes of Exercise per Session: Not on file  Stress:   . Feeling of Stress : Not on file  Social Connections:   . Frequency of Communication with Friends and Family: Not on file  . Frequency of Social Gatherings with Friends and Family: Not on file  . Attends Religious Services: Not on file  . Active Member of Clubs or Organizations: Not on file  . Attends Banker Meetings: Not on file  . Marital Status: Not on file  Intimate Partner Violence:   . Fear of Current or Ex-Partner: Not on file  . Emotionally Abused: Not on file  . Physically Abused: Not on file  . Sexually Abused: Not on file    Family History  Problem Relation Age of Onset  . Kidney disease Mother   . Osteoporosis Mother   . COPD Mother   . Hypertension Mother   . Hyperlipidemia Mother   . Obesity Mother   . Hypertension Father   . Cancer Father        throat  . Kidney disease Father        kidney stones and cancer  . Alzheimer's disease Father   . Hyperlipidemia Father   . Hypertension Sister   . Prostate cancer Maternal Grandfather   . Alzheimer's disease Paternal Grandmother   . Drug abuse Paternal Grandmother   . Heart disease Paternal Grandfather   . Drug abuse Paternal Grandfather     Review of Systems Acid heartburn Nasal congestion, recent sinus infection Anxiety Daytime fatigue  Constitutional: negative for anorexia, fevers and sweats  Eyes: negative for irritation, redness and visual disturbance  Ears, nose, mouth, throat, and face: negative for earaches, epistaxis and sore throat  Respiratory: negative for cough, dyspnea on exertion, sputum and wheezing  Cardiovascular: negative for chest pain, lower extremity edema, orthopnea, palpitations and syncope  Gastrointestinal: negative for abdominal pain,  constipation, diarrhea, melena, nausea and vomiting  Genitourinary:negative for dysuria, frequency and hematuria  Hematologic/lymphatic: negative for bleeding, easy bruising and lymphadenopathy  Musculoskeletal:negative for arthralgias, muscle weakness and stiff joints  Neurological: negative for coordination problems, gait problems, headaches and weakness  Endocrine: negative for diabetic symptoms including polydipsia, polyuria and weight loss     Objective:   Physical Exam  Gen. Pleasant, obese, in no distress, normal affect ENT - no pallor,icterus, no post nasal drip, class 2 airway Neck: No JVD, no thyromegaly, no carotid bruits Lungs: no use of accessory muscles, no dullness to percussion, decreased without  rales or rhonchi  Cardiovascular: Rhythm regular, heart sounds  normal, no murmurs or gallops, no peripheral edema Abdomen: soft and non-tender, no hepatosplenomegaly, BS normal. Musculoskeletal: No deformities, no cyanosis or clubbing Neuro:  alert, non focal, no tremors       Assessment & Plan:

## 2019-09-30 ENCOUNTER — Other Ambulatory Visit: Payer: Self-pay | Admitting: Family Medicine

## 2019-10-17 ENCOUNTER — Telehealth: Payer: Self-pay | Admitting: Family Medicine

## 2019-10-17 NOTE — Telephone Encounter (Signed)
Called patient and patient states possible exposure either sat. (9/25, 9/26) while babysitting.  Patient states, has been vaccinated.  Only experiencing mild-like symptoms, without fever/nausea,SOB/chills/bodyaches.  Only experiencing a mild cough, w/possible drainage, mild sore throat;feeling like having allergies.  Last temp. Taken @ 3:30 pm (97.4).    Patient would like to know which testing should she have done.

## 2019-10-17 NOTE — Telephone Encounter (Signed)
Caller : [name]  Call Back # [phone]  Patient states exposed to covid. Patient states she was baby sitting a Emily Davenport  And his mother dx with covid . Patient has a cough, congestion and nasal drip.  Please advise patient on wheat to do

## 2019-10-17 NOTE — Telephone Encounter (Signed)
She should be tested, can we test her here tomorrow or do we have to refer her elsewhere? I can do a virtual visit at 1 to get her ready for her test if we want to do it here.

## 2019-10-18 ENCOUNTER — Other Ambulatory Visit (HOSPITAL_BASED_OUTPATIENT_CLINIC_OR_DEPARTMENT_OTHER): Payer: Self-pay | Admitting: Family Medicine

## 2019-10-18 ENCOUNTER — Encounter: Payer: Self-pay | Admitting: Family Medicine

## 2019-10-18 ENCOUNTER — Telehealth (INDEPENDENT_AMBULATORY_CARE_PROVIDER_SITE_OTHER): Payer: 59 | Admitting: Family Medicine

## 2019-10-18 ENCOUNTER — Other Ambulatory Visit: Payer: Self-pay

## 2019-10-18 DIAGNOSIS — N289 Disorder of kidney and ureter, unspecified: Secondary | ICD-10-CM

## 2019-10-18 DIAGNOSIS — Z20822 Contact with and (suspected) exposure to covid-19: Secondary | ICD-10-CM | POA: Diagnosis not present

## 2019-10-18 DIAGNOSIS — E669 Obesity, unspecified: Secondary | ICD-10-CM | POA: Diagnosis not present

## 2019-10-18 DIAGNOSIS — I10 Essential (primary) hypertension: Secondary | ICD-10-CM

## 2019-10-18 DIAGNOSIS — I700 Atherosclerosis of aorta: Secondary | ICD-10-CM | POA: Diagnosis not present

## 2019-10-18 DIAGNOSIS — Z1231 Encounter for screening mammogram for malignant neoplasm of breast: Secondary | ICD-10-CM

## 2019-10-18 NOTE — Assessment & Plan Note (Signed)
Monitor and report any concerns, no changes to meds. Encouraged heart healthy diet such as the DASH diet and exercise as tolerated.  ?

## 2019-10-18 NOTE — Telephone Encounter (Signed)
Pt scheduled for appt 1 pm today

## 2019-10-18 NOTE — Assessment & Plan Note (Signed)
Seen on previous imaging. asymptomatic

## 2019-10-19 ENCOUNTER — Encounter: Payer: Self-pay | Admitting: Family Medicine

## 2019-10-19 ENCOUNTER — Other Ambulatory Visit: Payer: Self-pay | Admitting: Family Medicine

## 2019-10-19 DIAGNOSIS — Z20822 Contact with and (suspected) exposure to covid-19: Secondary | ICD-10-CM | POA: Insufficient documentation

## 2019-10-19 MED ORDER — ATORVASTATIN CALCIUM 10 MG PO TABS: 10.0000 mg | ORAL_TABLET | Freq: Every day | ORAL | 3 refills | Status: DC

## 2019-10-19 NOTE — Assessment & Plan Note (Signed)
She is working with surgery and considering bariatric procedure. maintain heart healthy diet, decrease po intake and increase exercise as tolerated. Needs 7-8 hours of sleep nightly. Avoid trans fats, eat small, frequent meals every 4-5 hours with lean proteins, complex carbs and healthy fats. Minimize simple carbs

## 2019-10-19 NOTE — Progress Notes (Signed)
Virtual Visit via Video Note  I connected with Emily Davenport on 10/18/19 at  1:00 PM EDT by a video enabled telemedicine application and verified that I am speaking with the correct person using two identifiers.  Location: Patient: home, patient and provider are in visit Provider: office   I discussed the limitations of evaluation and management by telemedicine and the availability of in person appointments. The patient expressed understanding and agreed to proceed. Tawni Carnes, CMA was able to get the patient set up on a video visit   Subjective:    Patient ID: Emily Davenport, female    DOB: 02-Dec-1960, 59 y.o.   MRN: [id number]  Chief Complaint  Patient presents with  . Covid Exposure    exposed 9/24 or 9/25  . Headache  . Sore Throat  . loss of taste  . Fatigue    HPI Patient is in today for evaluation of possible case of COVID. Patient spent 2 days with a 59 month old child baby sitting and then after that fact she was told the mother was diagnosed with COVID the next day. She was exposed on Friday and Saturday and developed symptoms on Sunday. With headache, cough, PND, fatigue and congestion. She has arranged for rapid testing at work tomorrow.Denies CP/palp/SOB/HA/congestion/fevers/GI or GU c/o. Taking meds as prescribed  Past Medical History:  Diagnosis Date  . Allergies   . Anxiety   . Arthritis   . Benign essential HTN 09/30/2015  . Bilateral swelling of feet   . Borderline hypertension   . Depression   . Fatty liver   . GERD (gastroesophageal reflux disease)   . H/O sinusitis 05/11/2015  . High serum parathyroid hormone (PTH) 08/18/2018  . History of chicken pox 05/11/2015  . History of chicken pox 05/11/2015  . Hx of endometriosis 03/28/2013  . Hyperlipidemia   . IBS (irritable bowel syndrome)   . Insomnia   . Kidney problem   . Knee pain   . Multiple sclerosis (HCC)   . PKD (polycystic kidney disease)   . Renal insufficiency 12/20/2013  . Seasonal allergies  05/11/2015  . Tear of lateral meniscus of right knee   . Vitamin D deficiency     Past Surgical History:  Procedure Laterality Date  . BLADDER SURGERY  2009   sling  . COLONOSCOPY    . Finger Joint Surgery    . KNEE ARTHROSCOPY WITH MEDIAL MENISECTOMY Right 08/15/2013   Procedure: RIGHT KNEE ARTHROSCOPY WITH DEBRIDEMENT/SHAVING (CHRONDRPLASTY), MEDIAL AND LATERAL MENISECTOMY;  Surgeon: Nilda Simmer, MD;  Location: [facility];  Service: Orthopedics;  Laterality: Right;  . NASAL SINUS SURGERY  2000  . planterfaciitis  1998   right  . RHINOPLASTY  1979  . UPPER GI ENDOSCOPY    . VAGINAL HYSTERECTOMY  2010  . WISDOM TOOTH EXTRACTION      Family History  Problem Relation Age of Onset  . Kidney disease Mother   . Osteoporosis Mother   . COPD Mother   . Hypertension Mother   . Hyperlipidemia Mother   . Obesity Mother   . Hypertension Father   . Cancer Father        throat  . Kidney disease Father        kidney stones and cancer  . Alzheimer's disease Father   . Hyperlipidemia Father   . Hypertension Sister   . Prostate cancer Maternal Grandfather   . Alzheimer's disease Paternal Grandmother   . Drug abuse Paternal  Grandmother   . Heart disease Paternal Grandfather   . Drug abuse Paternal Grandfather     Social History   Socioeconomic History  . Marital status: Married    Spouse name: Not on file  . Number of children: Not on file  . Years of education: Not on file  . Highest education level: Not on file  Occupational History  . Not on file  Tobacco Use  . Smoking status: Never Smoker  . Smokeless tobacco: Never Used  Vaping Use  . Vaping Use: Never used  Substance and Sexual Activity  . Alcohol use: Yes    Comment: occ  . Drug use: No  . Sexual activity: Not on file  Other Topics Concern  . Not on file  Social History Narrative   Retired Hotel manager, lives with husband   Social Determinants of Research scientist (physical sciences) Strain:   . Difficulty of Paying Living Expenses: Not on file  Food Insecurity:   . Worried About Programme researcher, broadcasting/film/video in the Last Year: Not on file  . Ran Out of Food in the Last Year: Not on file  Transportation Needs:   . Lack of Transportation (Medical): Not on file  . Lack of Transportation (Non-Medical): Not on file  Physical Activity:   . Days of Exercise per Week: Not on file  . Minutes of Exercise per Session: Not on file  Stress:   . Feeling of Stress : Not on file  Social Connections:   . Frequency of Communication with Friends and Family: Not on file  . Frequency of Social Gatherings with Friends and Family: Not on file  . Attends Religious Services: Not on file  . Active Member of Clubs or Organizations: Not on file  . Attends Banker Meetings: Not on file  . Marital Status: Not on file  Intimate Partner Violence:   . Fear of Current or Ex-Partner: Not on file  . Emotionally Abused: Not on file  . Physically Abused: Not on file  . Sexually Abused: Not on file    Outpatient Medications Prior to Visit  Medication Sig Dispense Refill  . aspirin EC 81 MG tablet Take 81 mg by mouth daily.    . Cholecalciferol (VITAMIN D3) 50 MCG (2000 UT) TABS Take by mouth 1 day or 1 dose. 1 per day for 6 days    . cloNIDine (CATAPRES) 0.1 MG tablet TAKE 1 TABLET BY MOUTH THREE TIMES DAILY 90 tablet 3  . famotidine (PEPCID) 40 MG tablet Take 1 tablet (40 mg total) by mouth at bedtime as needed for heartburn or indigestion. 30 tablet 5  . Ferrous Sulfate (IRON SLOW RELEASE) 140 (45 Fe) MG TBCR Take by mouth.    Marland Kitchen KRILL OIL PO Take 1 capsule by mouth daily.    Marland Kitchen LORazepam (ATIVAN) 1 MG tablet TAKE 1 TABLET BY MOUTH TWICE DAILY AS NEEDED FOR ANXIETY 60 tablet 1  . MELATONIN PO Take by mouth.    . metoprolol succinate (TOPROL-XL) 50 MG 24 hr tablet Take 1.5 tablets (75 mg total) by mouth daily. Take with or immediately following a meal. 125 tablet 1  . Vitamin D,  Ergocalciferol, (DRISDOL) 1.25 MG (50000 UNIT) CAPS capsule TAKE 1 CAPSULE BY MOUTH EVERY 7 DAYS 4 capsule 4  . zolpidem (AMBIEN CR) 12.5 MG CR tablet TAKE 1 TABLET BY MOUTH EACH NIGHT AT BEDTIME AS NEEDED FOR SLEEP 30 tablet 1  . fexofenadine (ALLEGRA) 180 MG tablet  Take 180 mg by mouth daily.     No facility-administered medications prior to visit.    Allergies  Allergen Reactions  . Augmentin [Amoxicillin-Pot Clavulanate]     diarrhea    Review of Systems  Constitutional: Positive for fever and malaise/fatigue.  HENT: Positive for congestion.   Eyes: Negative for blurred vision.  Respiratory: Positive for sputum production. Negative for shortness of breath.   Cardiovascular: Negative for chest pain, palpitations and leg swelling.  Gastrointestinal: Negative for abdominal pain, blood in stool and nausea.  Genitourinary: Negative for dysuria and frequency.  Musculoskeletal: Positive for myalgias. Negative for falls.  Skin: Negative for rash.  Neurological: Negative for dizziness, loss of consciousness and headaches.  Endo/Heme/Allergies: Negative for environmental allergies.  Psychiatric/Behavioral: Negative for depression. The patient is not nervous/anxious.        Objective:    Physical Exam Constitutional:      Appearance: She is well-developed. She is not ill-appearing.  HENT:     Head: Normocephalic and atraumatic.  Eyes:     Pupils: Pupils are equal.  Pulmonary:     Effort: Pulmonary effort is normal.  Musculoskeletal:     Cervical back: Normal range of motion.  Neurological:     Mental Status: She is alert.  Psychiatric:        Behavior: Behavior normal.     There were no vitals taken for this visit. Wt Readings from Last 3 Encounters:  09/14/19 237 lb 4.8 oz (107.6 kg)  03/07/19 221 lb (100.2 kg)  12/03/18 210 lb (95.3 kg)    Diabetic Foot Exam - Simple   No data filed     Lab Results  Component Value Date   WBC 9.1 03/24/2019   HGB 13.9  03/24/2019   HCT 41.4 03/24/2019   PLT 298.0 03/24/2019   GLUCOSE 116 (H) 03/24/2019   CHOL 220 (H) 03/24/2019   TRIG 157.0 (H) 03/24/2019   HDL 47.40 03/24/2019   LDLCALC 141 (H) 03/24/2019   ALT 52 (H) 03/24/2019   AST 34 03/24/2019   NA 139 03/24/2019   K 4.1 03/24/2019   CL 104 03/24/2019   CREATININE 0.93 03/24/2019   BUN 17 03/24/2019   CO2 26 03/24/2019   TSH 0.93 03/24/2019   HGBA1C 5.9 03/24/2019    Lab Results  Component Value Date   TSH 0.93 03/24/2019   Lab Results  Component Value Date   WBC 9.1 03/24/2019   HGB 13.9 03/24/2019   HCT 41.4 03/24/2019   MCV 84.7 03/24/2019   PLT 298.0 03/24/2019   Lab Results  Component Value Date   NA 139 03/24/2019   K 4.1 03/24/2019   CO2 26 03/24/2019   GLUCOSE 116 (H) 03/24/2019   BUN 17 03/24/2019   CREATININE 0.93 03/24/2019   BILITOT 0.5 03/24/2019   ALKPHOS 121 (H) 03/24/2019   AST 34 03/24/2019   ALT 52 (H) 03/24/2019   PROT 7.3 03/24/2019   ALBUMIN 4.0 03/24/2019   CALCIUM 10.0 03/24/2019   GFR 61.78 03/24/2019   Lab Results  Component Value Date   CHOL 220 (H) 03/24/2019   Lab Results  Component Value Date   HDL 47.40 03/24/2019   Lab Results  Component Value Date   LDLCALC 141 (H) 03/24/2019   Lab Results  Component Value Date   TRIG 157.0 (H) 03/24/2019   Lab Results  Component Value Date   CHOLHDL 5 03/24/2019   Lab Results  Component Value Date   HGBA1C 5.9  03/24/2019       Assessment & Plan:   Problem List Items Addressed This Visit    Renal insufficiency    Hydrate and monitor      Benign essential HTN    Monitor and report any concerns, no changes to meds. Encouraged heart healthy diet such as the DASH diet and exercise as tolerated.       Obesity    She is working with surgery and considering bariatric procedure. maintain heart healthy diet, decrease po intake and increase exercise as tolerated. Needs 7-8 hours of sleep nightly. Avoid trans fats, eat small, frequent  meals every 4-5 hours with lean proteins, complex carbs and healthy fats. Minimize simple carbs      Aortic atherosclerosis (HCC)    Seen on previous imaging. asymptomatic      Close exposure to COVID-19 virus    Patient spent 2 days with a 37 month old child baby sitting and then after that fact she was told the mother was diagnosed with COVID the next day. She was exposed on Friday and Saturday and developed symptoms on Sunday. With headache, cough, PND, fatigue and congestion. She has arranged for rapid testing at work tomorrow. She is encouraged to proceed with testing and let us know the results so can consider an infusion. Encouraged increased rest and hydration, add probiotics, zinc such as Coldeze or Xicam. Treat fevers as needed, vitamin D and krill oil daily         I have discontinued Tanina S. Steinkamp's fexofenadine. I am also having her maintain her Vitamin D3, Ferrous Sulfate, KRILL OIL PO, aspirin EC, famotidine, Vitamin D (Ergocalciferol), metoprolol succinate, zolpidem, LORazepam, cloNIDine, and MELATONIN PO.  No orders of the defined types were placed in this encounter.    I discussed the assessment and treatment plan with the patient. The patient was provided an opportunity to ask questions and all were answered. The patient agreed with the plan and demonstrated an understanding of the instructions.   The patient was advised to call back or seek an in-person evaluation if the symptoms worsen or if the condition fails to improve as anticipated.  I provided 20 minutes of face-to-face time during this encounter.   Danise Edge, MD

## 2019-10-19 NOTE — Assessment & Plan Note (Signed)
Hydrate and monitor 

## 2019-10-19 NOTE — Assessment & Plan Note (Signed)
Patient spent 2 days with a 71 month old child baby sitting and then after that fact she was told the mother was diagnosed with COVID the next day. She was exposed on Friday and Saturday and developed symptoms on Sunday. With headache, cough, PND, fatigue and congestion. She has arranged for rapid testing at work tomorrow. She is encouraged to proceed with testing and let us know the results so can consider an infusion. Encouraged increased rest and hydration, add probiotics, zinc such as Coldeze or Xicam. Treat fevers as needed, vitamin D and krill oil daily

## 2019-10-20 ENCOUNTER — Other Ambulatory Visit: Payer: Self-pay | Admitting: *Deleted

## 2019-10-20 ENCOUNTER — Telehealth: Payer: Self-pay | Admitting: Family Medicine

## 2019-10-20 MED ORDER — ATORVASTATIN CALCIUM 10 MG PO TABS
10.0000 mg | ORAL_TABLET | Freq: Every day | ORAL | 3 refills | Status: DC
Start: 2019-10-20 — End: 2020-01-10

## 2019-10-20 NOTE — Telephone Encounter (Signed)
Medication: atorvastatin (LIPITOR) 10 MG tablet [[id number]]    Has the patient contacted their pharmacy? No. (If no, request that the patient contact the pharmacy for the refill.) (If yes, when and what did the pharmacy advise?)  Preferred Pharmacy (with phone number or street name): [facility] [place] [address] [address]  [address] [place] [address]  Phone:  [phone] Fax:  [phone]  DEA #:  --  Agent: Please be advised that RX refills may take up to 3 business days. We ask that you follow-up with your pharmacy.

## 2019-10-20 NOTE — Telephone Encounter (Signed)
Called pt . Pt has picked up med from pharmacy. No further concerns

## 2019-10-24 ENCOUNTER — Ambulatory Visit (HOSPITAL_BASED_OUTPATIENT_CLINIC_OR_DEPARTMENT_OTHER)
Admission: RE | Admit: 2019-10-24 | Discharge: 2019-10-24 | Disposition: A | Discharge status: Home / Self Care | Payer: 59 | Source: Ambulatory Visit | Attending: Family Medicine | Admitting: Family Medicine

## 2019-10-24 ENCOUNTER — Other Ambulatory Visit: Payer: Self-pay

## 2019-10-24 DIAGNOSIS — Z1231 Encounter for screening mammogram for malignant neoplasm of breast: Secondary | ICD-10-CM | POA: Insufficient documentation

## 2019-10-25 ENCOUNTER — Other Ambulatory Visit: Payer: Self-pay | Admitting: Surgical Oncology

## 2019-10-25 ENCOUNTER — Other Ambulatory Visit: Payer: Self-pay | Admitting: Nephrology

## 2019-10-25 DIAGNOSIS — N200 Calculus of kidney: Secondary | ICD-10-CM

## 2019-10-25 DIAGNOSIS — K449 Diaphragmatic hernia without obstruction or gangrene: Secondary | ICD-10-CM

## 2019-10-27 ENCOUNTER — Ambulatory Visit
Admission: RE | Admit: 2019-10-27 | Discharge: 2019-10-27 | Disposition: A | Discharge status: Home / Self Care | Payer: 59 | Source: Ambulatory Visit | Attending: Surgical Oncology | Admitting: Surgical Oncology

## 2019-10-27 DIAGNOSIS — K449 Diaphragmatic hernia without obstruction or gangrene: Secondary | ICD-10-CM

## 2019-10-31 ENCOUNTER — Other Ambulatory Visit: Payer: 59

## 2019-11-11 NOTE — Addendum Note (Signed)
Addended by: Mervin Kung A on: 11/11/2019 08:48 AM   Modules accepted: Orders

## 2019-11-14 ENCOUNTER — Other Ambulatory Visit: Payer: Self-pay | Admitting: Family Medicine

## 2019-11-14 ENCOUNTER — Other Ambulatory Visit: Payer: 59

## 2019-11-14 ENCOUNTER — Other Ambulatory Visit: Payer: Self-pay

## 2019-11-14 DIAGNOSIS — R7303 Prediabetes: Secondary | ICD-10-CM

## 2019-11-14 DIAGNOSIS — I10 Essential (primary) hypertension: Secondary | ICD-10-CM

## 2019-11-14 NOTE — Telephone Encounter (Signed)
Requesting:LORazepam (ATIVAN) 1 MG tablet, zolpidem (AMBIEN CR) 12.5 MG CR tablet Contract:none NID:[id number] Last Visit:10/18/19 Next Visit:11/21/19 Last Refill:10/12/19  Please Advise

## 2019-11-15 ENCOUNTER — Encounter: Payer: Self-pay | Admitting: Family Medicine

## 2019-11-16 ENCOUNTER — Encounter: Payer: Self-pay | Admitting: Family Medicine

## 2019-11-16 LAB — LIPID PANEL
Cholesterol: 173 mg/dL (ref <200)
HDL: 56 mg/dL (ref >=50)
LDL Cholesterol (Calc): 93 mg/dL (calc)
Non-HDL Cholesterol (Calc): 117 mg/dL (calc) (ref <130)
Total CHOL/HDL Ratio: 3.1 (calc) (ref <5.0)
Triglycerides: 139 mg/dL (ref <150)

## 2019-11-16 LAB — COMPREHENSIVE METABOLIC PANEL
AG Ratio: 1.4 (calc) (ref 1.0–2.5)
ALT: 106 U/L — ABNORMAL HIGH (ref 6–29)
AST: 72 U/L — ABNORMAL HIGH (ref 10–35)
Albumin: 4.2 g/dL (ref 3.6–5.1)
Alkaline phosphatase (APISO): 124 U/L (ref 37–153)
BUN/Creatinine Ratio: 19 (calc) (ref 6–22)
BUN: 21 mg/dL (ref 7–25)
CO2: 28 mmol/L (ref 20–32)
Calcium: 9.9 mg/dL (ref 8.6–10.4)
Chloride: 104 mmol/L (ref 98–110)
Creat: 1.11 mg/dL — ABNORMAL HIGH (ref 0.50–1.05)
Globulin: 3 g/dL (calc) (ref 1.9–3.7)
Glucose, Bld: 127 mg/dL — ABNORMAL HIGH (ref 65–99)
Potassium: 4.7 mmol/L (ref 3.5–5.3)
Sodium: 139 mmol/L (ref 135–146)
Total Bilirubin: 0.8 mg/dL (ref 0.2–1.2)
Total Protein: 7.2 g/dL (ref 6.1–8.1)

## 2019-11-16 LAB — CBC
HCT: 41 % (ref 35.0–45.0)
Hemoglobin: 13.9 g/dL (ref 11.7–15.5)
MCH: 29.5 pg (ref 27.0–33.0)
MCHC: 33.9 g/dL (ref 32.0–36.0)
MCV: 87 fL (ref 80.0–100.0)
MPV: 10.6 fL (ref 7.5–12.5)
Platelets: 224 10*3/uL (ref 140–400)
RBC: 4.71 10*6/uL (ref 3.80–5.10)
RDW: 13.2 % (ref 11.0–15.0)
WBC: 6.6 10*3/uL (ref 3.8–10.8)

## 2019-11-16 LAB — TEST AUTHORIZATION

## 2019-11-16 LAB — TSH: TSH: 0.7 mIU/L (ref 0.40–4.50)

## 2019-11-16 LAB — HEMOGLOBIN A1C W/OUT EAG: Hgb A1c MFr Bld: 6.6 % of total Hgb — ABNORMAL HIGH (ref <5.7)

## 2019-11-16 NOTE — Progress Notes (Signed)
Pt seen message

## 2019-11-21 ENCOUNTER — Other Ambulatory Visit: Payer: Self-pay

## 2019-11-21 ENCOUNTER — Ambulatory Visit: Payer: 59 | Admitting: Family Medicine

## 2019-11-21 ENCOUNTER — Encounter: Payer: Self-pay | Admitting: Family Medicine

## 2019-11-21 DIAGNOSIS — E785 Hyperlipidemia, unspecified: Secondary | ICD-10-CM

## 2019-11-21 DIAGNOSIS — N289 Disorder of kidney and ureter, unspecified: Secondary | ICD-10-CM

## 2019-11-21 DIAGNOSIS — R519 Headache, unspecified: Secondary | ICD-10-CM

## 2019-11-21 DIAGNOSIS — E669 Obesity, unspecified: Secondary | ICD-10-CM

## 2019-11-21 DIAGNOSIS — R739 Hyperglycemia, unspecified: Secondary | ICD-10-CM | POA: Insufficient documentation

## 2019-11-21 DIAGNOSIS — R7989 Other specified abnormal findings of blood chemistry: Secondary | ICD-10-CM

## 2019-11-21 DIAGNOSIS — G4733 Obstructive sleep apnea (adult) (pediatric): Secondary | ICD-10-CM | POA: Diagnosis not present

## 2019-11-21 DIAGNOSIS — I10 Essential (primary) hypertension: Secondary | ICD-10-CM

## 2019-11-21 MED ORDER — METOPROLOL SUCCINATE ER 100 MG PO TB24: 100.0000 mg | ORAL_TABLET | Freq: Two times a day (BID) | ORAL | 1 refills | Status: DC

## 2019-11-21 NOTE — Assessment & Plan Note (Signed)
Supplement and monitor 

## 2019-11-21 NOTE — Progress Notes (Signed)
Subjective:    Patient ID: Emily Davenport, female    DOB: 1960-12-16, 59 y.o.   MRN: [id number]  Chief Complaint  Patient presents with  . Follow-up  . Hyperlipidemia    HPI Patient is in today for follow up on chronic medical concerns. No recent febrile illness or hospitalizations. She is very upset about her recent lab work and her high sugar. She has really cut down on her carbohydrates and processed foods since her blood work done last week. She is working with a Teacher, English as a foreign language at [employer] and is interested in bariatric surgery. She prefers the gastric sleeve but she may not be a candidate until they work up her reflux and hiatal hernia. Denies CP/palp/SOB/HA/congestion/fevers/GI or GU c/o. Taking meds as prescribed  Past Medical History:  Diagnosis Date  . Allergies   . Anxiety   . Arthritis   . Benign essential HTN 09/30/2015  . Bilateral swelling of feet   . Borderline hypertension   . Depression   . Fatty liver   . GERD (gastroesophageal reflux disease)   . H/O sinusitis 05/11/2015  . High serum parathyroid hormone (PTH) 08/18/2018  . History of chicken pox 05/11/2015  . History of chicken pox 05/11/2015  . Hx of endometriosis 03/28/2013  . Hyperlipidemia   . IBS (irritable bowel syndrome)   . Insomnia   . Kidney problem   . Knee pain   . Multiple sclerosis (HCC)   . PKD (polycystic kidney disease)   . Renal insufficiency 12/20/2013  . Seasonal allergies 05/11/2015  . Tear of lateral meniscus of right knee   . Vitamin D deficiency     Past Surgical History:  Procedure Laterality Date  . BLADDER SURGERY  2009   sling  . COLONOSCOPY    . Finger Joint Surgery    . KNEE ARTHROSCOPY WITH MEDIAL MENISECTOMY Right 08/15/2013   Procedure: RIGHT KNEE ARTHROSCOPY WITH DEBRIDEMENT/SHAVING (CHRONDRPLASTY), MEDIAL AND LATERAL MENISECTOMY;  Surgeon: Nilda Simmer, MD;  Location: [facility];  Service: Orthopedics;  Laterality: Right;  . NASAL SINUS SURGERY  2000   . planterfaciitis  1998   right  . RHINOPLASTY  1979  . UPPER GI ENDOSCOPY    . VAGINAL HYSTERECTOMY  2010  . WISDOM TOOTH EXTRACTION      Family History  Problem Relation Age of Onset  . Kidney disease Mother   . Osteoporosis Mother   . COPD Mother   . Hypertension Mother   . Hyperlipidemia Mother   . Obesity Mother   . Hypertension Father   . Cancer Father        throat  . Kidney disease Father        kidney stones and cancer  . Alzheimer's disease Father   . Hyperlipidemia Father   . Hypertension Sister   . Prostate cancer Maternal Grandfather   . Alzheimer's disease Paternal Grandmother   . Drug abuse Paternal Grandmother   . Heart disease Paternal Grandfather   . Drug abuse Paternal Grandfather     Social History   Socioeconomic History  . Marital status: Married    Spouse name: Not on file  . Number of children: Not on file  . Years of education: Not on file  . Highest education level: Not on file  Occupational History  . Not on file  Tobacco Use  . Smoking status: Never Smoker  . Smokeless tobacco: Never Used  Vaping Use  . Vaping Use: Never used  Substance  and Sexual Activity  . Alcohol use: Yes    Comment: occ  . Drug use: No  . Sexual activity: Not on file  Other Topics Concern  . Not on file  Social History Narrative   Retired Hotel manager, lives with husband   Social Determinants of Corporate investment banker Strain:   . Difficulty of Paying Living Expenses: Not on file  Food Insecurity:   . Worried About Programme researcher, broadcasting/film/video in the Last Year: Not on file  . Ran Out of Food in the Last Year: Not on file  Transportation Needs:   . Lack of Transportation (Medical): Not on file  . Lack of Transportation (Non-Medical): Not on file  Physical Activity:   . Days of Exercise per Week: Not on file  . Minutes of Exercise per Session: Not on file  Stress:   . Feeling of Stress : Not on file  Social Connections:   . Frequency  of Communication with Friends and Family: Not on file  . Frequency of Social Gatherings with Friends and Family: Not on file  . Attends Religious Services: Not on file  . Active Member of Clubs or Organizations: Not on file  . Attends Banker Meetings: Not on file  . Marital Status: Not on file  Intimate Partner Violence:   . Fear of Current or Ex-Partner: Not on file  . Emotionally Abused: Not on file  . Physically Abused: Not on file  . Sexually Abused: Not on file    Outpatient Medications Prior to Visit  Medication Sig Dispense Refill  . aspirin EC 81 MG tablet Take 81 mg by mouth daily.    Marland Kitchen atorvastatin (LIPITOR) 10 MG tablet Take 1 tablet (10 mg total) by mouth daily. 30 tablet 3  . Cholecalciferol (VITAMIN D3) 50 MCG (2000 UT) TABS Take by mouth 1 day or 1 dose. 1 per day for 6 days    . cloNIDine (CATAPRES) 0.1 MG tablet TAKE 1 TABLET BY MOUTH THREE TIMES DAILY 90 tablet 3  . famotidine (PEPCID) 40 MG tablet Take 1 tablet (40 mg total) by mouth at bedtime as needed for heartburn or indigestion. 30 tablet 5  . Ferrous Sulfate (IRON SLOW RELEASE) 140 (45 Fe) MG TBCR Take by mouth.    Marland Kitchen KRILL OIL PO Take 1 capsule by mouth daily.    Marland Kitchen LORazepam (ATIVAN) 1 MG tablet TAKE 1 TABLET BY MOUTH TWICE DAILY AS NEEDED FOR ANXIETY 60 tablet 1  . MELATONIN PO Take by mouth.    . Probiotic Product (PROBIOTIC DAILY) CAPS Take by mouth.    . Vitamin D, Ergocalciferol, (DRISDOL) 1.25 MG (50000 UNIT) CAPS capsule TAKE 1 CAPSULE BY MOUTH EVERY 7 DAYS 4 capsule 1  . zolpidem (AMBIEN CR) 12.5 MG CR tablet TAKE 1 TABLET BY MOUTH EACH NIGHT AT BEDTIME AS NEEDED FOR SLEEP 30 tablet 1  . metoprolol succinate (TOPROL-XL) 50 MG 24 hr tablet Take 1.5 tablets (75 mg total) by mouth daily. Take with or immediately following a meal. 125 tablet 1   No facility-administered medications prior to visit.    Allergies  Allergen Reactions  . Augmentin [Amoxicillin-Pot Clavulanate]     diarrhea     Review of Systems  Constitutional: Positive for malaise/fatigue. Negative for fever.  HENT: Negative for congestion.   Eyes: Negative for blurred vision.  Respiratory: Negative for shortness of breath.   Cardiovascular: Negative for chest pain, palpitations and leg swelling.  Gastrointestinal: Negative  for abdominal pain, blood in stool and nausea.  Genitourinary: Negative for dysuria and frequency.  Musculoskeletal: Negative for falls.  Skin: Negative for rash.  Neurological: Negative for dizziness, loss of consciousness and headaches.  Endo/Heme/Allergies: Negative for environmental allergies.  Psychiatric/Behavioral: Negative for depression. The patient is not nervous/anxious.        Objective:    Physical Exam Vitals and nursing note reviewed.  Constitutional:      General: She is not in acute distress.    Appearance: She is well-developed.  HENT:     Head: Normocephalic and atraumatic.     Nose: Nose normal.  Eyes:     General:        Right eye: No discharge.        Left eye: No discharge.  Cardiovascular:     Rate and Rhythm: Normal rate and regular rhythm.     Heart sounds: No murmur heard.   Pulmonary:     Effort: Pulmonary effort is normal.     Breath sounds: Normal breath sounds.  Abdominal:     General: Bowel sounds are normal.     Palpations: Abdomen is soft.     Tenderness: There is no abdominal tenderness.  Musculoskeletal:     Cervical back: Normal range of motion and neck supple.  Skin:    General: Skin is warm and dry.  Neurological:     Mental Status: She is alert and oriented to person, place, and time.     BP 128/78   Pulse 97   Temp 98.1 F (36.7 C) (Oral)   Wt 234 lb 6.4 oz (106.3 kg)   SpO2 98%   BMI 41.52 kg/m  Wt Readings from Last 3 Encounters:  11/21/19 234 lb 6.4 oz (106.3 kg)  09/14/19 237 lb 4.8 oz (107.6 kg)  03/07/19 221 lb (100.2 kg)    Diabetic Foot Exam - Simple   No data filed     Lab Results  Component  Value Date   WBC 6.6 11/14/2019   HGB 13.9 11/14/2019   HCT 41.0 11/14/2019   PLT 224 11/14/2019   GLUCOSE 127 (H) 11/14/2019   CHOL 173 11/14/2019   TRIG 139 11/14/2019   HDL 56 11/14/2019   LDLCALC 93 11/14/2019   ALT 106 (H) 11/14/2019   AST 72 (H) 11/14/2019   NA 139 11/14/2019   K 4.7 11/14/2019   CL 104 11/14/2019   CREATININE 1.11 (H) 11/14/2019   BUN 21 11/14/2019   CO2 28 11/14/2019   TSH 0.70 11/14/2019   HGBA1C 6.6 (H) 11/14/2019    Lab Results  Component Value Date   TSH 0.70 11/14/2019   Lab Results  Component Value Date   WBC 6.6 11/14/2019   HGB 13.9 11/14/2019   HCT 41.0 11/14/2019   MCV 87.0 11/14/2019   PLT 224 11/14/2019   Lab Results  Component Value Date   NA 139 11/14/2019   K 4.7 11/14/2019   CO2 28 11/14/2019   GLUCOSE 127 (H) 11/14/2019   BUN 21 11/14/2019   CREATININE 1.11 (H) 11/14/2019   BILITOT 0.8 11/14/2019   ALKPHOS 121 (H) 03/24/2019   AST 72 (H) 11/14/2019   ALT 106 (H) 11/14/2019   PROT 7.2 11/14/2019   ALBUMIN 4.0 03/24/2019   CALCIUM 9.9 11/14/2019   GFR 61.78 03/24/2019   Lab Results  Component Value Date   CHOL 173 11/14/2019   Lab Results  Component Value Date   HDL 56 11/14/2019  Lab Results  Component Value Date   LDLCALC 93 11/14/2019   Lab Results  Component Value Date   TRIG 139 11/14/2019   Lab Results  Component Value Date   CHOLHDL 3.1 11/14/2019   Lab Results  Component Value Date   HGBA1C 6.6 (H) 11/14/2019       Assessment & Plan:   Problem List Items Addressed This Visit    Hyperlipidemia    Encouraged heart healthy diet, increase exercise, avoid trans fats, consider a krill oil cap daily. She is willing to see a nutritionist to discuss      Relevant Medications   metoprolol succinate (TOPROL-XL) 100 MG 24 hr tablet   Other Relevant Orders   Amb ref to Medical Nutrition Therapy-MNT   Lipid panel   TSH   Renal insufficiency    Mild, hydrate and monitor, minimize NSAID use to  200 mg tid prn pain with plenty of water      Relevant Orders   Comprehensive metabolic panel   TSH   Elevated LFTs    Mild encouraged weight loss and to minimize simple carbohydrates in diet      Benign essential HTN    Well controlled, no changes to meds. Encouraged heart healthy diet such as the DASH diet and exercise as tolerated.       Relevant Medications   metoprolol succinate (TOPROL-XL) 100 MG 24 hr tablet   Low vitamin D level    Supplement and monitor      Relevant Orders   VITAMIN D 25 Hydroxy (Vit-D Deficiency, Fractures)   Obesity    She is working with [facility] and would prefer the gastric sleeve but she is eligible for the gastric bypass faster due to her BMI but if she looses weight and gets her BMI under 40 she will not be automatically eligible. She has hyperglycemia, hyperlipidemia, obesity, sleep apnea, fatty liver, hypertension none of which were poorly controlled. She is having trouble deciding what to do      Relevant Orders   Amb ref to Medical Nutrition Therapy-MNT   OSA (obstructive sleep apnea)    Using CPAP and she tolerates it.       Relevant Orders   CBC   Hyperglycemia    hgba1c acceptable, minimize simple carbs. Increase exercise as tolerated. She is referred to nutritionist to discuss further dietary changes      Relevant Orders   Amb ref to Medical Nutrition Therapy-MNT   Hemoglobin A1c   TSH   Headache    Encouraged increased hydration, 64 ounces of clear fluids daily. Minimize alcohol and caffeine. Eat small frequent meals with lean proteins and complex carbs. Avoid high and low blood sugars. Get adequate sleep, 7-8 hours a night. Needs exercise daily preferably in the morning. Tylenol 500 mg tid and Advil 200 mg tid prn for pain      Relevant Medications   metoprolol succinate (TOPROL-XL) 100 MG 24 hr tablet      I have discontinued Damira S. Meester's metoprolol succinate. I am also having her start on metoprolol succinate.  Additionally, I am having her maintain her Vitamin D3, Ferrous Sulfate, KRILL OIL PO, aspirin EC, famotidine, cloNIDine, MELATONIN PO, atorvastatin, LORazepam, Vitamin D (Ergocalciferol), zolpidem, and Probiotic Daily.  Meds ordered this encounter  Medications  . metoprolol succinate (TOPROL-XL) 100 MG 24 hr tablet    Sig: Take 1 tablet (100 mg total) by mouth in the morning and at bedtime. Take with or immediately following a  meal.    Dispense:  180 tablet    Refill:  1     Danise Edge, MD

## 2019-11-21 NOTE — Assessment & Plan Note (Signed)
Well controlled, no changes to meds. Encouraged heart healthy diet such as the DASH diet and exercise as tolerated.  °

## 2019-11-21 NOTE — Patient Instructions (Addendum)
MIND diet is good platform  Weight Watcher or NOOM APP  Cooking Light plan   Tylenol/Acetaminophen ES 500 mg ( max of 3000 mg in 24) stay at or below 1500 mg a day  Advil/Motrin/Ibuprofen 200 mg tabs, 1 tab up to 3 x a day as needed for pain (max of 2400 mg in 2400)  NOW company probiotics and supplements, cranberry tabs   Carbohydrate Counting for Diabetes Mellitus, Adult  Carbohydrate counting is a method of keeping track of how many carbohydrates you eat. Eating carbohydrates naturally increases the amount of sugar (glucose) in the blood. Counting how many carbohydrates you eat helps keep your blood glucose within normal limits, which helps you manage your diabetes (diabetes mellitus). It is important to know how many carbohydrates you can safely have in each meal. This is different for every person. A diet and nutrition specialist (registered dietitian) can help you make a meal plan and calculate how many carbohydrates you should have at each meal and snack. Carbohydrates are found in the following foods:  Grains, such as breads and cereals.  Dried beans and soy products.  Starchy vegetables, such as potatoes, peas, and corn.  Fruit and fruit juices.  Milk and yogurt.  Sweets and snack foods, such as cake, cookies, candy, chips, and soft drinks. How do I count carbohydrates? There are two ways to count carbohydrates in food. You can use either of the methods or a combination of both. Reading "Nutrition Facts" on packaged food The "Nutrition Facts" list is included on the labels of almost all packaged foods and beverages in the [place] It includes:  The serving size.  Information about nutrients in each serving, including the grams (g) of carbohydrate per serving. To use the "Nutrition Facts":  Decide how many servings you will have.  Multiply the number of servings by the number of carbohydrates per serving.  The resulting number is the total amount of carbohydrates that  you will be having. Learning standard serving sizes of other foods When you eat carbohydrate foods that are not packaged or do not include "Nutrition Facts" on the label, you need to measure the servings in order to count the amount of carbohydrates:  Measure the foods that you will eat with a food scale or measuring cup, if needed.  Decide how many standard-size servings you will eat.  Multiply the number of servings by 15. Most carbohydrate-rich foods have about 15 g of carbohydrates per serving. ? For example, if you eat 8 oz (170 g) of strawberries, you will have eaten 2 servings and 30 g of carbohydrates (2 servings x 15 g = 30 g).  For foods that have more than one food mixed, such as soups and casseroles, you must count the carbohydrates in each food that is included. The following list contains standard serving sizes of common carbohydrate-rich foods. Each of these servings has about 15 g of carbohydrates:   hamburger bun or  English muffin.   oz (15 mL) syrup.   oz (14 g) jelly.  1 slice of bread.  1 six-inch tortilla.  3 oz (85 g) cooked rice or pasta.  4 oz (113 g) cooked dried beans.  4 oz (113 g) starchy vegetable, such as peas, corn, or potatoes.  4 oz (113 g) hot cereal.  4 oz (113 g) mashed potatoes or  of a large baked potato.  4 oz (113 g) canned or frozen fruit.  4 oz (120 mL) fruit juice.  4-6 crackers.  6 chicken nuggets.  6 oz (170 g) unsweetened dry cereal.  6 oz (170 g) plain fat-free yogurt or yogurt sweetened with artificial sweeteners.  8 oz (240 mL) milk.  8 oz (170 g) fresh fruit or one small piece of fruit.  24 oz (680 g) popped popcorn. Example of carbohydrate counting Sample meal  3 oz (85 g) chicken breast.  6 oz (170 g) brown rice.  4 oz (113 g) corn.  8 oz (240 mL) milk.  8 oz (170 g) strawberries with sugar-free whipped topping. Carbohydrate calculation 1. Identify the foods that contain  carbohydrates: ? Rice. ? Corn. ? Milk. ? Strawberries. 2. Calculate how many servings you have of each food: ? 2 servings rice. ? 1 serving corn. ? 1 serving milk. ? 1 serving strawberries. 3. Multiply each number of servings by 15 g: ? 2 servings rice x 15 g = 30 g. ? 1 serving corn x 15 g = 15 g. ? 1 serving milk x 15 g = 15 g. ? 1 serving strawberries x 15 g = 15 g. 4. Add together all of the amounts to find the total grams of carbohydrates eaten: ? 30 g + 15 g + 15 g + 15 g = 75 g of carbohydrates total. Summary  Carbohydrate counting is a method of keeping track of how many carbohydrates you eat.  Eating carbohydrates naturally increases the amount of sugar (glucose) in the blood.  Counting how many carbohydrates you eat helps keep your blood glucose within normal limits, which helps you manage your diabetes.  A diet and nutrition specialist (registered dietitian) can help you make a meal plan and calculate how many carbohydrates you should have at each meal and snack. This information is not intended to replace advice given to you by your health care provider. Make sure you discuss any questions you have with your health care provider. Document Revised: 07/31/2016 Document Reviewed: 06/20/2015 Elsevier Patient Education  2020 [employer].

## 2019-11-21 NOTE — Assessment & Plan Note (Addendum)
hgba1c acceptable, minimize simple carbs. Increase exercise as tolerated. She is referred to nutritionist to discuss further dietary changes

## 2019-11-21 NOTE — Assessment & Plan Note (Signed)
She is working with [facility] and would prefer the gastric sleeve but she is eligible for the gastric bypass faster due to her BMI but if she looses weight and gets her BMI under 40 she will not be automatically eligible. She has hyperglycemia, hyperlipidemia, obesity, sleep apnea, fatty liver, hypertension none of which were poorly controlled. She is having trouble deciding what to do

## 2019-11-21 NOTE — Assessment & Plan Note (Signed)
Mild encouraged weight loss and to minimize simple carbohydrates in diet

## 2019-11-21 NOTE — Assessment & Plan Note (Signed)
Using CPAP and she tolerates it.

## 2019-11-21 NOTE — Assessment & Plan Note (Signed)
Encouraged increased hydration, 64 ounces of clear fluids daily. Minimize alcohol and caffeine. Eat small frequent meals with lean proteins and complex carbs. Avoid high and low blood sugars. Get adequate sleep, 7-8 hours a night. Needs exercise daily preferably in the morning. Tylenol 500 mg tid and Advil 200 mg tid prn for pain

## 2019-11-21 NOTE — Assessment & Plan Note (Addendum)
Mild, hydrate and monitor, minimize NSAID use to 200 mg tid prn pain with plenty of water

## 2019-11-21 NOTE — Assessment & Plan Note (Signed)
Encouraged heart healthy diet, increase exercise, avoid trans fats, consider a krill oil cap daily. She is willing to see a nutritionist to discuss

## 2019-11-29 ENCOUNTER — Encounter: Payer: Self-pay | Admitting: Family Medicine

## 2019-12-05 ENCOUNTER — Other Ambulatory Visit: Payer: Self-pay | Admitting: Orthopedic Surgery

## 2019-12-05 DIAGNOSIS — M67912 Unspecified disorder of synovium and tendon, left shoulder: Secondary | ICD-10-CM

## 2019-12-13 ENCOUNTER — Ambulatory Visit
Admission: RE | Admit: 2019-12-13 | Discharge: 2019-12-13 | Disposition: A | Discharge status: Home / Self Care | Payer: 59 | Source: Ambulatory Visit | Attending: Orthopedic Surgery | Admitting: Orthopedic Surgery

## 2019-12-13 ENCOUNTER — Other Ambulatory Visit: Payer: Self-pay

## 2019-12-13 DIAGNOSIS — M67912 Unspecified disorder of synovium and tendon, left shoulder: Secondary | ICD-10-CM

## 2019-12-20 ENCOUNTER — Other Ambulatory Visit: Payer: 59

## 2019-12-22 ENCOUNTER — Other Ambulatory Visit: Payer: Self-pay | Admitting: Family Medicine

## 2019-12-25 ENCOUNTER — Other Ambulatory Visit: Payer: 59

## 2019-12-27 ENCOUNTER — Other Ambulatory Visit: Payer: Self-pay

## 2019-12-27 ENCOUNTER — Ambulatory Visit (INDEPENDENT_AMBULATORY_CARE_PROVIDER_SITE_OTHER): Payer: 59 | Admitting: Pulmonary Disease

## 2019-12-27 ENCOUNTER — Encounter: Payer: Self-pay | Admitting: Pulmonary Disease

## 2019-12-27 DIAGNOSIS — G4733 Obstructive sleep apnea (adult) (pediatric): Secondary | ICD-10-CM | POA: Diagnosis not present

## 2019-12-27 DIAGNOSIS — E669 Obesity, unspecified: Secondary | ICD-10-CM | POA: Diagnosis not present

## 2019-12-27 NOTE — Assessment & Plan Note (Signed)
Recommend a goal weight loss of 10% of her body weight which would be around 205 pounds. If she is able to get to that level we will consider repeating home sleep test

## 2019-12-27 NOTE — Patient Instructions (Signed)
Increase auto CPAP settings to 8 to 15 cm Call us in mid January so that we can pull the report and review  Congratulations on your weight loss !  Suggest we aim for a goal weight of 205 pounds

## 2019-12-27 NOTE — Progress Notes (Signed)
° °  Subjective:    Patient ID: Emily Davenport, female    DOB: 07-16-1960, 59 y.o.   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  She also takes Ativan 1 mg twice daily for anxiety. She has chronic sinusitis and undergone multiple sinus surgeries x4 last in 2004 TMJ  On her last visit we discussed home sleep test which showed positional component and since she was very symptomatic we decided to undertake therapeutic trial of CPAP.  She is settled down with full facemask initially had problems adjusting but now is more compliant and able to use 5 to 6 hours every night.  She has lost 9 pounds since her initial visit. Bedtime is between 11 PM and midnight, she now is awake around 6:30 AM and feels well rested.  She likes to sleep in  Significant tests/ events reviewed  HST 06/2019 showed mild OSA, AHI 9/hour, predominantly supine position      Review of Systems neg for any significant sore throat, dysphagia, itching, sneezing, nasal congestion or excess/ purulent secretions, fever, chills, sweats, unintended wt loss, pleuritic or exertional cp, hempoptysis, orthopnea pnd or change in chronic leg swelling. Also denies presyncope, palpitations, heartburn, abdominal pain, nausea, vomiting, diarrhea or change in bowel or urinary habits, dysuria,hematuria, rash, arthralgias, visual complaints, headache, numbness weakness or ataxia.     Objective:   Physical Exam  Gen. Pleasant, obese, in no distress ENT - no lesions, no post nasal drip Neck: No JVD, no thyromegaly, no carotid bruits Lungs: no use of accessory muscles, no dullness to percussion, decreased without rales or rhonchi  Cardiovascular: Rhythm regular, heart sounds  normal, no murmurs or gallops, no peripheral edema Musculoskeletal: No deformities, no cyanosis or  clubbing , no tremors       Assessment & Plan:

## 2019-12-27 NOTE — Assessment & Plan Note (Signed)
I would call this a successful therapeutic trial of CPAP she certainly feels more rested and has more energy.  I feel that her baseline HST was underestimating her OSA.  She definitely has a positional component. Download shows residual AHI of 5/hour with acceptable compliance and on 6 hours on nights used and minimal leak.  However AHI seems to fluctuate from 2-13 events per hour which may be related to the positional component inside feels like she needs slightly increased pressure Increase auto CPAP settings to 8 to 15 cm , keep EPR setting of 3 Call us in mid January so that we can pull the report and review  Weight loss encouraged, compliance with goal of at least 4-6 hrs every night is the expectation. Advised against medications with sedative side effects Cautioned against driving when sleepy - understanding that sleepiness will vary on a day to day basis

## 2020-01-02 ENCOUNTER — Telehealth: Payer: Self-pay | Admitting: Family Medicine

## 2020-01-02 NOTE — Telephone Encounter (Signed)
Did you advise to take the aspirin?

## 2020-01-02 NOTE — Telephone Encounter (Signed)
I recommended Aspirin but at this point she can stop it and we can rediscuss if she gets COVID

## 2020-01-02 NOTE — Telephone Encounter (Signed)
Caller: Jean Rosenthal [facility] Call back # [phone]  They need to know who is prescribing aspirin to patient for clearance purpose. Patient is schedule for surgery on 01/16/20.

## 2020-01-03 NOTE — Telephone Encounter (Signed)
The plastic surgeon is sending over clearance form.

## 2020-01-03 NOTE — Telephone Encounter (Signed)
She is getting surgery on 01/16/20.   I know the next question is probably when she should stop.  Is it still 5 days prior?

## 2020-01-05 NOTE — Telephone Encounter (Signed)
Surgical clearance form completed by Dr. Abner Greenspan, form faxed to [phone]. Form sent for scanning.

## 2020-01-10 ENCOUNTER — Other Ambulatory Visit: Payer: Self-pay | Admitting: Family Medicine

## 2020-01-10 MED ORDER — ZOLPIDEM TARTRATE ER 12.5 MG PO TBCR
12.5000 mg | EXTENDED_RELEASE_TABLET | Freq: Every evening | ORAL | 3 refills | Status: DC | PRN
Start: 2020-01-10 — End: 2020-01-12

## 2020-01-10 MED ORDER — LORAZEPAM 1 MG PO TABS
1.0000 mg | ORAL_TABLET | Freq: Two times a day (BID) | ORAL | 3 refills | Status: DC | PRN
Start: 2020-01-10 — End: 2020-01-12

## 2020-01-10 MED ORDER — VITAMIN D (ERGOCALCIFEROL) 1.25 MG (50000 UNIT) PO CAPS
ORAL_CAPSULE | ORAL | 1 refills | Status: DC
Start: 2020-01-10 — End: 2020-01-12

## 2020-01-10 MED ORDER — CLONIDINE HCL 0.1 MG PO TABS
0.1000 mg | ORAL_TABLET | Freq: Three times a day (TID) | ORAL | 3 refills | Status: DC
Start: 2020-01-10 — End: 2020-01-12

## 2020-01-10 MED ORDER — METOPROLOL SUCCINATE ER 100 MG PO TB24
100.0000 mg | ORAL_TABLET | Freq: Two times a day (BID) | ORAL | 1 refills | Status: DC
Start: 2020-01-10 — End: 2020-01-12

## 2020-01-10 MED ORDER — ATORVASTATIN CALCIUM 10 MG PO TABS
10.0000 mg | ORAL_TABLET | Freq: Every day | ORAL | 3 refills | Status: DC
Start: 2020-01-10 — End: 2020-01-12

## 2020-01-10 NOTE — Telephone Encounter (Signed)
Please fill ativan & ambien .    Requesting: Contract:n/a UDS:n/a Last Visit:11/2019 Next Visit:02/2020 Last Refill:10/2019  Please Advise

## 2020-01-11 ENCOUNTER — Encounter: Payer: Self-pay | Admitting: Family Medicine

## 2020-01-12 ENCOUNTER — Other Ambulatory Visit: Payer: Self-pay | Admitting: Family Medicine

## 2020-01-12 MED ORDER — FAMOTIDINE 40 MG PO TABS: 40.0000 mg | ORAL_TABLET | Freq: Every evening | ORAL | 3 refills | Status: DC | PRN

## 2020-01-12 MED ORDER — VITAMIN D (ERGOCALCIFEROL) 1.25 MG (50000 UNIT) PO CAPS: 50000.0000 [IU] | ORAL_CAPSULE | ORAL | 0 refills | Status: DC

## 2020-01-12 MED ORDER — METOPROLOL SUCCINATE ER 100 MG PO TB24: 100.0000 mg | ORAL_TABLET | Freq: Two times a day (BID) | ORAL | 1 refills | Status: DC

## 2020-01-12 MED ORDER — ZOLPIDEM TARTRATE ER 12.5 MG PO TBCR: 12.5000 mg | EXTENDED_RELEASE_TABLET | Freq: Every evening | ORAL | 1 refills | Status: DC | PRN

## 2020-01-12 MED ORDER — LORAZEPAM 1 MG PO TABS
1.0000 mg | ORAL_TABLET | Freq: Two times a day (BID) | ORAL | 3 refills | Status: DC | PRN
Start: 2020-01-12 — End: 2020-01-12

## 2020-01-12 MED ORDER — LORAZEPAM 1 MG PO TABS: 1.0000 mg | ORAL_TABLET | Freq: Two times a day (BID) | ORAL | 1 refills | Status: DC | PRN

## 2020-01-12 MED ORDER — ZOLPIDEM TARTRATE ER 12.5 MG PO TBCR: 12.5000 mg | EXTENDED_RELEASE_TABLET | Freq: Every evening | ORAL | 3 refills | Status: DC | PRN

## 2020-01-12 MED ORDER — ATORVASTATIN CALCIUM 10 MG PO TABS
10.0000 mg | ORAL_TABLET | Freq: Every day | ORAL | 1 refills | Status: DC
Start: 2020-01-12 — End: 2020-10-23

## 2020-01-12 MED ORDER — CLONIDINE HCL 0.1 MG PO TABS: 0.1000 mg | ORAL_TABLET | Freq: Three times a day (TID) | ORAL | 1 refills | Status: DC

## 2020-01-12 NOTE — Telephone Encounter (Signed)
Pt requesting meds as 90 day supplies. Can you resend Ambien CR and lorazepam please?

## 2020-02-13 NOTE — Addendum Note (Signed)
Addended by: Mervin Kung A on: 02/13/2020 02:08 PM   Modules accepted: Orders

## 2020-02-14 ENCOUNTER — Encounter: Payer: Self-pay | Admitting: Family Medicine

## 2020-02-14 ENCOUNTER — Other Ambulatory Visit: Payer: 59

## 2020-02-14 ENCOUNTER — Other Ambulatory Visit: Payer: Self-pay | Admitting: Family Medicine

## 2020-02-14 MED ORDER — NYSTATIN 100000 UNIT/GM EX OINT: 1.0000 "application " | TOPICAL_OINTMENT | Freq: Two times a day (BID) | CUTANEOUS | 3 refills | Status: DC

## 2020-02-14 MED ORDER — FLUCONAZOLE 150 MG PO TABS: 150.0000 mg | ORAL_TABLET | ORAL | 1 refills | Status: DC

## 2020-02-21 ENCOUNTER — Telehealth: Payer: 59 | Admitting: Family Medicine

## 2020-02-21 ENCOUNTER — Other Ambulatory Visit: Payer: Self-pay | Admitting: Family Medicine

## 2020-02-21 ENCOUNTER — Encounter: Payer: Self-pay | Admitting: Family Medicine

## 2020-02-21 ENCOUNTER — Telehealth: Payer: Self-pay | Admitting: Family Medicine

## 2020-02-21 MED ORDER — DOXYCYCLINE HYCLATE 100 MG PO TABS: 100.0000 mg | ORAL_TABLET | Freq: Two times a day (BID) | ORAL | 0 refills | Status: DC

## 2020-02-21 NOTE — Telephone Encounter (Signed)
I went ahead and sent her in Doxycycline but if her bite gets worse she needs to be seen. She does not have a dep injury or one she is concerned about?

## 2020-02-21 NOTE — Telephone Encounter (Signed)
Patient is checking the status.

## 2020-02-21 NOTE — Telephone Encounter (Signed)
Patient states on 02/21/2020 her dog bite her on her left hand,patient  would like to know can an  antibiotic be send to the pharmacy on [address]  [facility]  what can she do in the mean time   Please advice

## 2020-02-22 NOTE — Telephone Encounter (Signed)
Pt is aware of medication being sent in. Pt states that she really doesn't have any concerns and she thinks it's getting better. Pt knows to call in if it gets worse.

## 2020-03-05 ENCOUNTER — Telehealth: Payer: Self-pay

## 2020-03-05 ENCOUNTER — Other Ambulatory Visit: Payer: Self-pay

## 2020-03-05 ENCOUNTER — Other Ambulatory Visit (INDEPENDENT_AMBULATORY_CARE_PROVIDER_SITE_OTHER): Payer: 59

## 2020-03-05 DIAGNOSIS — E785 Hyperlipidemia, unspecified: Secondary | ICD-10-CM

## 2020-03-05 DIAGNOSIS — R7989 Other specified abnormal findings of blood chemistry: Secondary | ICD-10-CM | POA: Diagnosis not present

## 2020-03-05 DIAGNOSIS — R739 Hyperglycemia, unspecified: Secondary | ICD-10-CM

## 2020-03-05 DIAGNOSIS — N289 Disorder of kidney and ureter, unspecified: Secondary | ICD-10-CM | POA: Diagnosis not present

## 2020-03-05 DIAGNOSIS — G4733 Obstructive sleep apnea (adult) (pediatric): Secondary | ICD-10-CM | POA: Diagnosis not present

## 2020-03-05 LAB — COMPREHENSIVE METABOLIC PANEL
ALT: 22 U/L (ref 0–35)
AST: 16 U/L (ref 0–37)
Albumin: 4.3 g/dL (ref 3.5–5.2)
Alkaline Phosphatase: 123 U/L — ABNORMAL HIGH (ref 39–117)
BUN: 27 mg/dL — ABNORMAL HIGH (ref 6–23)
CO2: 27 mEq/L (ref 19–32)
Calcium: 9.9 mg/dL (ref 8.4–10.5)
Chloride: 102 mEq/L (ref 96–112)
Creatinine, Ser: 0.97 mg/dL (ref 0.40–1.20)
GFR: 63.91 mL/min (ref >60.00)
Glucose, Bld: 103 mg/dL — ABNORMAL HIGH (ref 70–99)
Potassium: 3.7 mEq/L (ref 3.5–5.1)
Sodium: 138 mEq/L (ref 135–145)
Total Bilirubin: 0.4 mg/dL (ref 0.2–1.2)
Total Protein: 7.3 g/dL (ref 6.0–8.3)

## 2020-03-05 LAB — LIPID PANEL
Cholesterol: 149 mg/dL (ref 0–200)
HDL: 47.7 mg/dL (ref >39.00)
LDL Cholesterol: 70 mg/dL (ref 0–99)
NonHDL: 101.1
Total CHOL/HDL Ratio: 3
Triglycerides: 156 mg/dL — ABNORMAL HIGH (ref 0.0–149.0)
VLDL: 31.2 mg/dL (ref 0.0–40.0)

## 2020-03-05 LAB — HEMOGLOBIN A1C: Hgb A1c MFr Bld: 5.5 % (ref 4.6–6.5)

## 2020-03-05 LAB — CBC
HCT: 42.4 % (ref 36.0–46.0)
Hemoglobin: 14.2 g/dL (ref 12.0–15.0)
MCHC: 33.5 g/dL (ref 30.0–36.0)
MCV: 85.9 fl (ref 78.0–100.0)
Platelets: 224 10*3/uL (ref 150.0–400.0)
RBC: 4.94 Mil/uL (ref 3.87–5.11)
RDW: 13.3 % (ref 11.5–15.5)
WBC: 8.1 10*3/uL (ref 4.0–10.5)

## 2020-03-05 LAB — TSH: TSH: 1.8 u[IU]/mL (ref 0.35–4.50)

## 2020-03-05 LAB — VITAMIN D 25 HYDROXY (VIT D DEFICIENCY, FRACTURES): VITD: 38.01 ng/mL (ref 30.00–100.00)

## 2020-03-05 NOTE — Telephone Encounter (Signed)
Caller states she has fasting labs this morning and wants to know if she is allowed to have sips of Water.  Telephone: [phone] (Primary  Disp. Time Lamount Cohen Time) Disposition Final User 03/05/2020 8:36:36 AM Clinical Call Yes Baxter, RN, Gregary Signs Comments User: Lorraine Lax, RN Date/Time Lamount Cohen Time): 03/05/2020 8:36:15 AM Caller states has already been seen at office. Closing call

## 2020-03-08 ENCOUNTER — Telehealth (INDEPENDENT_AMBULATORY_CARE_PROVIDER_SITE_OTHER): Payer: 59 | Admitting: Family Medicine

## 2020-03-08 ENCOUNTER — Other Ambulatory Visit: Payer: Self-pay

## 2020-03-08 DIAGNOSIS — R7989 Other specified abnormal findings of blood chemistry: Secondary | ICD-10-CM | POA: Diagnosis not present

## 2020-03-08 DIAGNOSIS — E669 Obesity, unspecified: Secondary | ICD-10-CM

## 2020-03-08 DIAGNOSIS — Z0000 Encounter for general adult medical examination without abnormal findings: Secondary | ICD-10-CM

## 2020-03-08 DIAGNOSIS — E785 Hyperlipidemia, unspecified: Secondary | ICD-10-CM | POA: Diagnosis not present

## 2020-03-08 DIAGNOSIS — N289 Disorder of kidney and ureter, unspecified: Secondary | ICD-10-CM

## 2020-03-08 DIAGNOSIS — R739 Hyperglycemia, unspecified: Secondary | ICD-10-CM

## 2020-03-08 DIAGNOSIS — I10 Essential (primary) hypertension: Secondary | ICD-10-CM

## 2020-03-11 NOTE — Assessment & Plan Note (Signed)
Encouraged heart healthy diet, increase exercise, avoid trans fats, consider a krill oil cap daily 

## 2020-03-11 NOTE — Assessment & Plan Note (Signed)
hgba1c acceptable, minimize simple carbs. Increase exercise as tolerated. C 

## 2020-03-11 NOTE — Assessment & Plan Note (Signed)
Hydrate and monitor 

## 2020-03-11 NOTE — Progress Notes (Signed)
Virtual Visit via Video Note  I connected with Emily Davenport on 03/08/20 at  3:20 PM EST by a video enabled telemedicine application and verified that I am speaking with the correct person using two identifiers.  Location: Patient: home, patient and provider in visit Provider: office   I discussed the limitations of evaluation and management by telemedicine and the availability of in person appointments. The patient expressed understanding and agreed to proceed. S Chism, CMA was able to get the patient set up on a video visit    Subjective:    Patient ID: Emily Davenport, female    DOB: 09-Apr-1960, 60 y.o.   MRN: [id number]  Chief Complaint  Patient presents with  . Follow-up    HPI Patient is in today for follow up on chronic medical concerns. No recent febrile illness or hospitalizations. She has really worked hard at changing her diet and moving more. She has cut out all simple carbohydrates and processed foods. She has changed how and when she eats. Eats smaller meals earlier in the day. She is pleased with her weight loss after all of her hard work. Her labs are much better also which is reassuring to her that the hard work has been paying off. She feels well. Denies CP/palp/SOB/HA/congestion/fevers/GI or GU c/o. Taking meds as prescribed  Past Medical History:  Diagnosis Date  . Allergies   . Anxiety   . Arthritis   . Benign essential HTN 09/30/2015  . Bilateral swelling of feet   . Borderline hypertension   . Depression   . Fatty liver   . GERD (gastroesophageal reflux disease)   . H/O sinusitis 05/11/2015  . High serum parathyroid hormone (PTH) 08/18/2018  . History of chicken pox 05/11/2015  . History of chicken pox 05/11/2015  . Hx of endometriosis 03/28/2013  . Hyperlipidemia   . IBS (irritable bowel syndrome)   . Insomnia   . Kidney problem   . Knee pain   . Multiple sclerosis (HCC)   . PKD (polycystic kidney disease)   . Renal insufficiency 12/20/2013  .  Seasonal allergies 05/11/2015  . Tear of lateral meniscus of right knee   . Vitamin D deficiency     Past Surgical History:  Procedure Laterality Date  . BLADDER SURGERY  2009   sling  . COLONOSCOPY    . Finger Joint Surgery    . KNEE ARTHROSCOPY WITH MEDIAL MENISECTOMY Right 08/15/2013   Procedure: RIGHT KNEE ARTHROSCOPY WITH DEBRIDEMENT/SHAVING (CHRONDRPLASTY), MEDIAL AND LATERAL MENISECTOMY;  Surgeon: Nilda Simmer, MD;  Location: [facility];  Service: Orthopedics;  Laterality: Right;  . NASAL SINUS SURGERY  2000  . planterfaciitis  1998   right  . RHINOPLASTY  1979  . UPPER GI ENDOSCOPY    . VAGINAL HYSTERECTOMY  2010  . WISDOM TOOTH EXTRACTION      Family History  Problem Relation Age of Onset  . Kidney disease Mother   . Osteoporosis Mother   . COPD Mother   . Hypertension Mother   . Hyperlipidemia Mother   . Obesity Mother   . Hypertension Father   . Cancer Father        throat  . Kidney disease Father        kidney stones and cancer  . Alzheimer's disease Father   . Hyperlipidemia Father   . Hypertension Sister   . Prostate cancer Maternal Grandfather   . Alzheimer's disease Paternal Grandmother   . Drug abuse Paternal Grandmother   .  Heart disease Paternal Grandfather   . Drug abuse Paternal Grandfather     Social History   Socioeconomic History  . Marital status: Married    Spouse name: Not on file  . Number of children: Not on file  . Years of education: Not on file  . Highest education level: Not on file  Occupational History  . Not on file  Tobacco Use  . Smoking status: Never Smoker  . Smokeless tobacco: Never Used  Vaping Use  . Vaping Use: Never used  Substance and Sexual Activity  . Alcohol use: Yes    Comment: occ  . Drug use: No  . Sexual activity: Not on file  Other Topics Concern  . Not on file  Social History Narrative   [place] [employer], lives with husband   Social Determinants of Manufacturing engineerHealth    Financial Resource Strain: Not on [employer]file  Food Insecurity: Not on file  Transportation Needs: Not on file  Physical Activity: Not on file  Stress: Not on file  Social Connections: Not on file  Intimate Partner Violence: Not on file    Outpatient Medications Prior to Visit  Medication Sig Dispense Refill  . aspirin EC 81 MG tablet Take 81 mg by mouth daily.    Marland Kitchen. atorvastatin (LIPITOR) 10 MG tablet Take 1 tablet (10 mg total) by mouth daily. 90 tablet 1  . Cholecalciferol (VITAMIN D3) 50 MCG (2000 UT) TABS Take by mouth 1 day or 1 dose. 1 per day for 6 days    . cloNIDine (CATAPRES) 0.1 MG tablet Take 1 tablet (0.1 mg total) by mouth 3 (three) times daily. 270 tablet 1  . Cranberry 1000 MG CAPS cranberry    . doxycycline (VIBRA-TABS) 100 MG tablet Take 1 tablet (100 mg total) by mouth 2 (two) times daily. 20 tablet 0  . famotidine (PEPCID) 40 MG tablet Take 1 tablet (40 mg total) by mouth at bedtime as needed for heartburn or indigestion. 90 tablet 3  . Ferrous Sulfate 140 (45 Fe) MG TBCR Take by mouth.    . fluconazole (DIFLUCAN) 150 MG tablet Take 1 tablet (150 mg total) by mouth once a week. 2 tablet 1  . KRILL OIL PO Take 1 capsule by mouth daily.    Marland Kitchen. LORazepam (ATIVAN) 1 MG tablet Take 1 tablet (1 mg total) by mouth 2 (two) times daily as needed. for anxiety 180 tablet 1  . Melatonin 1 MG/4ML LIQD melatonin    . MELATONIN PO Take by mouth.    . nystatin ointment (MYCOSTATIN) Apply 1 application topically 2 (two) times daily. 30 g 3  . Probiotic Product (PROBIOTIC DAILY) CAPS Take by mouth.    . Vitamin D, Ergocalciferol, (DRISDOL) 1.25 MG (50000 UNIT) CAPS capsule Take 1 capsule (50,000 Units total) by mouth every 7 (seven) days. TAKE 1 CAPSULE BY MOUTH EVERY 7 DAYS 12 capsule 0  . zolpidem (AMBIEN CR) 12.5 MG CR tablet Take 1 tablet (12.5 mg total) by mouth at bedtime as needed for sleep. 90 tablet 1  . metoprolol succinate (TOPROL-XL) 100 MG 24 hr tablet Take 1 tablet (100 mg  total) by mouth in the morning and at bedtime. Take with or immediately following a meal. 180 tablet 1   No facility-administered medications prior to visit.    Allergies  Allergen Reactions  . Augmentin [Amoxicillin-Pot Clavulanate]     diarrhea    Review of Systems  Constitutional: Negative for fever and malaise/fatigue.  HENT: Negative for  congestion.   Eyes: Negative for blurred vision.  Respiratory: Negative for shortness of breath.   Cardiovascular: Negative for chest pain, palpitations and leg swelling.  Gastrointestinal: Negative for abdominal pain, blood in stool and nausea.  Genitourinary: Negative for dysuria and frequency.  Musculoskeletal: Negative for falls.  Skin: Negative for rash.  Neurological: Negative for dizziness, loss of consciousness and headaches.  Endo/Heme/Allergies: Negative for environmental allergies.  Psychiatric/Behavioral: Negative for depression. The patient is not nervous/anxious.        Objective:    Physical Exam Constitutional:      Appearance: Normal appearance. She is not ill-appearing.  HENT:     Head: Normocephalic and atraumatic.     Right Ear: External ear normal.     Left Ear: External ear normal.     Nose: Nose normal.  Eyes:     Extraocular Movements: Extraocular movements intact.  Pulmonary:     Effort: Pulmonary effort is normal.  Skin:    Capillary Refill: Capillary refill takes less than 2 seconds.  Neurological:     Mental Status: She is alert and oriented to person, place, and time.  Psychiatric:        Behavior: Behavior normal.     There were no vitals taken for this visit. Wt Readings from Last 3 Encounters:  12/27/19 228 lb 6 oz (103.6 kg)  11/21/19 234 lb 6.4 oz (106.3 kg)  09/14/19 237 lb 4.8 oz (107.6 kg)    Diabetic Foot Exam - Simple   No data filed    Lab Results  Component Value Date   WBC 8.1 03/05/2020   HGB 14.2 03/05/2020   HCT 42.4 03/05/2020   PLT 224.0 03/05/2020   GLUCOSE 103 (H)  03/05/2020   CHOL 149 03/05/2020   TRIG 156.0 (H) 03/05/2020   HDL 47.70 03/05/2020   LDLCALC 70 03/05/2020   ALT 22 03/05/2020   AST 16 03/05/2020   NA 138 03/05/2020   K 3.7 03/05/2020   CL 102 03/05/2020   CREATININE 0.97 03/05/2020   BUN 27 (H) 03/05/2020   CO2 27 03/05/2020   TSH 1.80 03/05/2020   HGBA1C 5.5 03/05/2020    Lab Results  Component Value Date   TSH 1.80 03/05/2020   Lab Results  Component Value Date   WBC 8.1 03/05/2020   HGB 14.2 03/05/2020   HCT 42.4 03/05/2020   MCV 85.9 03/05/2020   PLT 224.0 03/05/2020   Lab Results  Component Value Date   NA 138 03/05/2020   K 3.7 03/05/2020   CO2 27 03/05/2020   GLUCOSE 103 (H) 03/05/2020   BUN 27 (H) 03/05/2020   CREATININE 0.97 03/05/2020   BILITOT 0.4 03/05/2020   ALKPHOS 123 (H) 03/05/2020   AST 16 03/05/2020   ALT 22 03/05/2020   PROT 7.3 03/05/2020   ALBUMIN 4.3 03/05/2020   CALCIUM 9.9 03/05/2020   GFR 63.91 03/05/2020   Lab Results  Component Value Date   CHOL 149 03/05/2020   Lab Results  Component Value Date   HDL 47.70 03/05/2020   Lab Results  Component Value Date   LDLCALC 70 03/05/2020   Lab Results  Component Value Date   TRIG 156.0 (H) 03/05/2020   Lab Results  Component Value Date   CHOLHDL 3 03/05/2020   Lab Results  Component Value Date   HGBA1C 5.5 03/05/2020       Assessment & Plan:   Problem List Items Addressed This Visit    Hyperlipidemia - Primary  Encouraged heart healthy diet, increase exercise, avoid trans fats, consider a krill oil cap daily      Relevant Orders   CBC with Differential/Platelet   Comprehensive metabolic panel   Lipid panel   Renal insufficiency    Hydrate and monitor      Benign essential HTN    Monitor and report any concerns, no changes to meds. Encouraged heart healthy diet such as the DASH diet and exercise as tolerated.       Low vitamin D level    Supplement and monitor      Relevant Orders   Vitamin D 1,25  dihydroxy   Obesity    Good weight loss with dietary changes. encouraged to stay active and maintain dietary changes.       Hyperglycemia    hgba1c acceptable, minimize simple carbs. Increase exercise as tolerated. C       Other Visit Diagnoses    Preventative health care       Relevant Orders   TSH      I am having Emily Davenport maintain her Vitamin D3, Ferrous Sulfate, KRILL OIL PO, aspirin EC, MELATONIN PO, Probiotic Daily, Melatonin, Cranberry, atorvastatin, cloNIDine, famotidine, metoprolol succinate, Vitamin D (Ergocalciferol), zolpidem, LORazepam, nystatin ointment, fluconazole, and doxycycline.  No orders of the defined types were placed in this encounter.    I discussed the assessment and treatment plan with the patient. The patient was provided an opportunity to ask questions and all were answered. The patient agreed with the plan and demonstrated an understanding of the instructions.   The patient was advised to call back or seek an in-person evaluation if the symptoms worsen or if the condition fails to improve as anticipated.  I provided 26 minutes of non-face-to-face time during this encounter.   Danise Edge, MD

## 2020-03-11 NOTE — Assessment & Plan Note (Signed)
Supplement and monitor 

## 2020-03-11 NOTE — Assessment & Plan Note (Addendum)
Good weight loss with dietary changes. encouraged to stay active and maintain dietary changes.

## 2020-03-11 NOTE — Assessment & Plan Note (Signed)
Monitor and report any concerns, no changes to meds. Encouraged heart healthy diet such as the DASH diet and exercise as tolerated.  ?

## 2020-05-03 ENCOUNTER — Other Ambulatory Visit: Payer: Self-pay | Admitting: Podiatry

## 2020-05-03 ENCOUNTER — Ambulatory Visit (INDEPENDENT_AMBULATORY_CARE_PROVIDER_SITE_OTHER): Payer: 59

## 2020-05-03 ENCOUNTER — Encounter: Payer: Self-pay | Admitting: Podiatry

## 2020-05-03 ENCOUNTER — Other Ambulatory Visit: Payer: Self-pay

## 2020-05-03 ENCOUNTER — Ambulatory Visit: Payer: 59 | Admitting: Podiatry

## 2020-05-03 DIAGNOSIS — M7671 Peroneal tendinitis, right leg: Secondary | ICD-10-CM

## 2020-05-03 DIAGNOSIS — M79671 Pain in right foot: Secondary | ICD-10-CM

## 2020-05-03 MED ORDER — DICLOFENAC SODIUM 75 MG PO TBEC: 75.0000 mg | DELAYED_RELEASE_TABLET | Freq: Two times a day (BID) | ORAL | 2 refills | Status: DC

## 2020-05-06 DIAGNOSIS — M7671 Peroneal tendinitis, right leg: Secondary | ICD-10-CM | POA: Diagnosis not present

## 2020-05-06 MED ORDER — TRIAMCINOLONE ACETONIDE 10 MG/ML IJ SUSP
10.0000 mg | Freq: Once | INTRAMUSCULAR | Status: AC
  Administered 2020-05-06: 10 mg

## 2020-05-06 NOTE — Progress Notes (Signed)
Subjective:   Patient ID: Emily Davenport, female   DOB: 60 y.o.   MRN: [id number]   HPI Patient presents stating that she is having a lot of pain around the outside of the right foot and states that its been hurting for several months.  Does not remember specific injury and states it gets sore when she tries to be active and wear shoe gear.  Patient does not smoke likes to be active   Review of Systems  All other systems reviewed and are negative.       Objective:  Physical Exam Vitals and nursing note reviewed.  Constitutional:      Appearance: She is well-developed.  Pulmonary:     Effort: Pulmonary effort is normal.  Musculoskeletal:        General: Normal range of motion.  Skin:    General: Skin is warm.  Neurological:     Mental Status: She is alert.     Neurovascular status intact muscle strength adequate range of motion adequate.  Patient is found to have quite a bit of discomfort in the outside of the right foot around the peroneal insertion into the base of the fifth metatarsal with inflammation fluid noted around the insertional point with no indication of tendon or muscle weakness.  Patient has good digital perfusion well oriented x3     Assessment:  Acute peroneal tendinitis with other pains which are most likely compensatory     Plan:  H&P x-ray reviewed and will get a focus on the one problem.  I went ahead today did sterile prep and injected the peroneal insertion 3 mg Kenalog 5 mg Xylocaine and applied fascial brace to lift up the lateral side of the foot along with aggressive ice topical medicine and reappoint for [place] to recheck again in the next few weeks  X-rays indicate no indications of bony injury or

## 2020-05-07 ENCOUNTER — Other Ambulatory Visit: Payer: Self-pay | Admitting: Family Medicine

## 2020-05-24 ENCOUNTER — Ambulatory Visit: Payer: 59 | Admitting: Podiatry

## 2020-05-28 ENCOUNTER — Other Ambulatory Visit: Payer: Self-pay | Admitting: Family Medicine

## 2020-06-04 ENCOUNTER — Encounter: Payer: Self-pay | Admitting: Family Medicine

## 2020-06-04 ENCOUNTER — Encounter: Payer: Self-pay | Admitting: Podiatry

## 2020-06-04 ENCOUNTER — Other Ambulatory Visit: Payer: Self-pay

## 2020-06-04 ENCOUNTER — Other Ambulatory Visit: Payer: Self-pay | Admitting: Family Medicine

## 2020-06-04 ENCOUNTER — Ambulatory Visit: Payer: 59 | Admitting: Podiatry

## 2020-06-04 DIAGNOSIS — M7671 Peroneal tendinitis, right leg: Secondary | ICD-10-CM | POA: Diagnosis not present

## 2020-06-04 DIAGNOSIS — M76811 Anterior tibial syndrome, right leg: Secondary | ICD-10-CM | POA: Diagnosis not present

## 2020-06-04 DIAGNOSIS — R7989 Other specified abnormal findings of blood chemistry: Secondary | ICD-10-CM

## 2020-06-04 MED ORDER — TRIAMCINOLONE ACETONIDE 10 MG/ML IJ SUSP
10.0000 mg | Freq: Once | INTRAMUSCULAR | Status: AC
  Administered 2020-06-04: 10 mg

## 2020-06-04 NOTE — Progress Notes (Signed)
Subjective:   Patient ID: Emily Davenport, female   DOB: 60 y.o.   MRN: [id number]   HPI Patient states she is having improvement of pain on the outside of the right foot but still slightly sore and now has a lot of discomfort on the dorsal medial side of the right foot   ROS      Objective:  Physical Exam  Neurovascular status intact with discomfort around the peroneal insertion base of fifth metatarsal right improved with mild discomfort still noted and quite a bit of discomfort around the anterior tibial insertion of the right medial foot with no indication of muscle dysfunction      Assessment:  Improving peroneal tendinitis right with still mild symptoms along with anterior tibial tendinitis right      Plan:  H&P condition reviewed and for the lateral foot we will continue ice and we discussed modifications to shoes at this time.  For the medial which is quite inflamed I did do sterile prep and injected the insertion 3 mg Dexasone Kenalog 5 mg Xylocaine we will utilize fascial brace to lift up the medial side of the foot advised on ice reduced activity.  Reappoint as symptoms indicate

## 2020-06-11 ENCOUNTER — Telehealth: Payer: Self-pay | Admitting: Pulmonary Disease

## 2020-06-11 NOTE — Telephone Encounter (Signed)
Spoke with pt who stating not using C Pap for 3 - 4 week period r/t having Covid. Pt has since resumed usage and states no questions or concerns. Nothing further needed at this time.

## 2020-06-28 ENCOUNTER — Ambulatory Visit: Payer: 59 | Admitting: Adult Health

## 2020-06-29 ENCOUNTER — Ambulatory Visit: Payer: 59 | Admitting: Adult Health

## 2020-07-02 ENCOUNTER — Encounter: Payer: Self-pay | Admitting: Adult Health

## 2020-07-02 ENCOUNTER — Other Ambulatory Visit: Payer: Self-pay

## 2020-07-02 ENCOUNTER — Ambulatory Visit: Payer: 59 | Admitting: Adult Health

## 2020-07-02 VITALS — BP 130/76 | HR 78

## 2020-07-02 DIAGNOSIS — F5101 Primary insomnia: Secondary | ICD-10-CM | POA: Diagnosis not present

## 2020-07-02 DIAGNOSIS — Z6839 Body mass index (BMI) 39.0-39.9, adult: Secondary | ICD-10-CM | POA: Diagnosis not present

## 2020-07-02 DIAGNOSIS — G4733 Obstructive sleep apnea (adult) (pediatric): Secondary | ICD-10-CM

## 2020-07-02 NOTE — Assessment & Plan Note (Signed)
OSA with good control on CPAP.  Patient is continue on current settings.  Patient education given.  Plan  Patient Instructions  Continue on CPAP At bedtime   Work on healthy weight loss.  Order for Dreamwear Nasal mask- small  Healthy sleep regimen  Ambien As needed  insomnia  Follow up with Dr. Vassie Loll  in 1 year and As needed

## 2020-07-02 NOTE — Assessment & Plan Note (Signed)
Healthy weight loss discussed.  Patient continue with work-up regarding gastric bypass surgery. We did discuss once she has reached her goal weight that we could repeat her sleep study.

## 2020-07-02 NOTE — Assessment & Plan Note (Signed)
Healthy sleep regimen discussed.  Patient education given on sleep medicines.  Plan  Patient Instructions  Continue on CPAP At bedtime   Work on healthy weight loss.  Order for Dreamwear Nasal mask- small  Healthy sleep regimen  Ambien As needed  insomnia  Follow up with Dr. Vassie Loll  in 1 year and As needed

## 2020-07-02 NOTE — Progress Notes (Signed)
@Patient  ID: , female    DOB: 01-11-1961, 60 y.o.   MRN: [id number]  Chief Complaint  Patient presents with   Follow-up    Referring provider: [id number], MD  HPI: 60 year old female retired 46 followed for mild obstructive sleep apnea and insomnia Medical history significant for chronic sinusitis with previous sinus surgery, TMJ.  TEST/EVENTS :  HST 06/2019 showed mild OSA, AHI 9/hour, predominantly supine position  07/02/2020 Follow up : OSA and Insomnia  Patient returns for a 21-month follow-up.  Patient has underlying mild obstructive sleep apnea.  She is on nocturnal CPAP.  Patient says she tries to wear her CPAP each night.  Recently had COVID-19 infection and did not wear it for a week or so due to respiratory symptoms.  Patient says she has tried several mask.  Her current fullface mask leaks quite a bit.  We discussed alternative mask as she would like to try the DreamWear nasal mask. CPAP download shows good compliance with daily average usage at 5.5 hours.  She is on auto CPAP 8 to 15 cm H2O.  AHI 1.1.  Positive mask leak. Patient education given on sleep apnea, CPAP. \She does feel that she benefits from CPAP.  Feels that she has less daytime sleepiness.  Patient has obesity.  She is currently undergoing evaluation for bariatric surgery.  She says this hopefully will be done in the next 2 months.  Current weight is at 228.  BMI is at 39.  We discussed healthy weight loss.  She says once she is able to lose weight she wants to see if her sleep apnea goes away.  She would like to get off of her CPAP machine.  Patient has chronic insomnia.  She has been on multiple sleep medicine in the past.  Currently she is using Ambien CR 12.5 mg daily.  She also uses a Benadryl or melatonin.  She says she still has some ongoing issues with insomnia.  Healthy sleep regimen was discussed.     Allergies  Allergen Reactions   Augmentin [Amoxicillin-Pot  Clavulanate]     diarrhea    Immunization History  Administered Date(s) Administered   Influenza,inj,Quad PF,6+ Mos 10/10/2017   Influenza,inj,Quad PF,6-35 Mos 10/22/2018   Influenza-Unspecified 10/20/2013, 10/21/2014, 11/07/2019   Moderna Sars-Covid-2 Vaccination 03/17/2019, 04/14/2019, 11/22/2019   Tdap 03/07/2019   Zoster Recombinat (Shingrix) 08/16/2018, 11/16/2018    Past Medical History:  Diagnosis Date   Allergies    Anxiety    Arthritis    Benign essential HTN 09/30/2015   Bilateral swelling of feet    Borderline hypertension    Depression    Fatty liver    GERD (gastroesophageal reflux disease)    H/O sinusitis 05/11/2015   High serum parathyroid hormone (PTH) 08/18/2018   History of chicken pox 05/11/2015   History of chicken pox 05/11/2015   Hx of endometriosis 03/28/2013   Hyperlipidemia    IBS (irritable bowel syndrome)    Insomnia    Kidney problem    Knee pain    Multiple sclerosis (HCC)    PKD (polycystic kidney disease)    Renal insufficiency 12/20/2013   Seasonal allergies 05/11/2015   Tear of lateral meniscus of right knee    Vitamin D deficiency     Tobacco History: Social History   Tobacco Use  Smoking Status Never  Smokeless Tobacco Never   Counseling given: Not Answered   Outpatient Medications Prior to Visit  Medication Sig Dispense Refill  aspirin EC 81 MG tablet Take 81 mg by mouth daily.     atorvastatin (LIPITOR) 10 MG tablet Take 1 tablet (10 mg total) by mouth daily. 90 tablet 1   azelastine (ASTELIN) 0.1 % nasal spray azelastine 137 mcg (0.1 %) nasal spray aerosol     Cholecalciferol (VITAMIN D3) 50 MCG (2000 UT) TABS Take by mouth 1 day or 1 dose. 1 per day for 6 days     cloNIDine (CATAPRES) 0.1 MG tablet Take 1 tablet (0.1 mg total) by mouth 3 (three) times daily. 270 tablet 1   diphenhydrAMINE (SOMINEX) 25 MG tablet Take by mouth.     famotidine (PEPCID) 40 MG tablet Take 1 tablet (40 mg total) by mouth at bedtime as needed for  heartburn or indigestion. 90 tablet 3   Ferrous Sulfate 140 (45 Fe) MG TBCR Take by mouth.     KRILL OIL PO Take 1 capsule by mouth daily.     LORazepam (ATIVAN) 1 MG tablet Take 1 tablet (1 mg total) by mouth 2 (two) times daily as needed. for anxiety 180 tablet 1   metoprolol succinate (TOPROL-XL) 100 MG 24 hr tablet Take 1 tablet (100 mg total) by mouth in the morning and at bedtime. Take with or immediately following a meal. 180 tablet 1   montelukast (SINGULAIR) 10 MG tablet Take 10 mg by mouth at bedtime.     nystatin ointment (MYCOSTATIN) Apply 1 application topically 2 (two) times daily. 30 g 3   Probiotic Product (PROBIOTIC DAILY) CAPS Take by mouth.     zolpidem (AMBIEN CR) 12.5 MG CR tablet Take 1 tablet (12.5 mg total) by mouth at bedtime as needed for sleep. 90 tablet 1   diclofenac (VOLTAREN) 75 MG EC tablet Take 1 tablet (75 mg total) by mouth 2 (two) times daily. 50 tablet 2   doxycycline (VIBRA-TABS) 100 MG tablet TAKE 1 TABLET BY MOUTH TWICE DAILY 20 tablet 0   erythromycin ophthalmic ointment SMARTSIG:In Eye(s)     fluconazole (DIFLUCAN) 150 MG tablet Take 1 tablet (150 mg total) by mouth once a week. 2 tablet 1   ibuprofen (ADVIL) 200 MG tablet Take by mouth.     ketorolac (TORADOL) 10 MG tablet ketorolac 10 mg tablet  TAKE 1 TABLET BY MOUTH EVERY 8 HOURS     Melatonin 1 MG/4ML LIQD melatonin     MELATONIN PO Take by mouth.     predniSONE (DELTASONE) 20 MG tablet prednisone 20 mg tablet     Cranberry 1000 MG CAPS cranberry     No facility-administered medications prior to visit.     Review of Systems:   Constitutional:   No  weight loss, night sweats,  Fevers, chills, fatigue, or  lassitude.  HEENT:   No headaches,  Difficulty swallowing,  Tooth/dental problems, or  Sore throat,                No sneezing, itching, ear ache, nasal congestion, post nasal drip,   CV:  No chest pain,  Orthopnea, PND, swelling in lower extremities, anasarca, dizziness, palpitations,  syncope.   GI  No heartburn, indigestion, abdominal pain, nausea, vomiting, diarrhea, change in bowel habits, loss of appetite, bloody stools.   Resp: No shortness of breath with exertion or at rest.  No excess mucus, no productive cough,  No non-productive cough,  No coughing up of blood.  No change in color of mucus.  No wheezing.  No chest wall deformity  Skin: no rash  or lesions.  GU: no dysuria, change in color of urine, no urgency or frequency.  No flank pain, no hematuria   MS:  No joint pain or swelling.  No decreased range of motion.  No back pain.    Physical Exam  BP 130/76   Pulse 78   SpO2 98%   GEN: A/Ox3; pleasant , NAD, well nourished    HEENT:  [place]/AT,  , NOSE-clear, THROAT-clear, no lesions, no postnasal drip or exudate noted.  Class II MP airway  NECK:  Supple w/ fair ROM; no JVD; normal carotid impulses w/o bruits; no thyromegaly or nodules palpated; no lymphadenopathy.    RESP  Clear  P & A; w/o, wheezes/ rales/ or rhonchi. no accessory muscle use, no dullness to percussion  CARD:  RRR, no m/r/g, no peripheral edema, pulses intact, no cyanosis or clubbing.  GI:   Soft & nt; nml bowel sounds; no organomegaly or masses detected.   Musco: Warm bil, no deformities or joint swelling noted.   Neuro: alert, no focal deficits noted.    Skin: Warm, no lesions or rashes    Lab Results:  CBC    Component Value Date/Time   WBC 8.1 03/05/2020 0829   RBC 4.94 03/05/2020 0829   HGB 14.2 03/05/2020 0829   HGB 13.4 10/04/2012 1448   HCT 42.4 03/05/2020 0829   HCT 40.0 10/04/2012 1448   PLT 224.0 03/05/2020 0829   PLT 235 10/04/2012 1448   MCV 85.9 03/05/2020 0829   MCV 88 10/04/2012 1448   MCH 29.5 11/14/2019 1045   MCHC 33.5 03/05/2020 0829   RDW 13.3 03/05/2020 0829   RDW 13.1 10/04/2012 1448   LYMPHSABS 2.2 12/19/2013 1218   LYMPHSABS 2.8 10/04/2012 1448   MONOABS 0.7 12/19/2013 1218   EOSABS 0.1 12/19/2013 1218   EOSABS 0.1 10/04/2012 1448    BASOSABS 0.0 12/19/2013 1218   BASOSABS 0.0 10/04/2012 1448    BMET   ProBNP No results found for: PROBNP  Imaging: No results found.    No results found for: NITRICOXIDE      Assessment & Plan:   OSA (obstructive sleep apnea) OSA with good control on CPAP.  Patient is continue on current settings.  Patient education given.  Plan  Patient Instructions  Continue on CPAP At bedtime   Work on healthy weight loss.  Order for Dreamwear Nasal mask- small  Healthy sleep regimen  Ambien As needed  insomnia  Follow up with Dr. Vassie Loll  in 1 year and As needed         Obesity Healthy weight loss discussed.  Patient continue with work-up regarding gastric bypass surgery. We did discuss once she has reached her goal weight that we could repeat her sleep study.  Insomnia Healthy sleep regimen discussed.  Patient education given on sleep medicines.  Plan  Patient Instructions  Continue on CPAP At bedtime   Work on healthy weight loss.  Order for Dreamwear Nasal mask- small  Healthy sleep regimen  Ambien As needed  insomnia  Follow up with Dr. Vassie Loll  in 1 year and As needed          I spent   31 minutes dedicated to the care of this patient on the date of this encounter to include pre-visit review of records, face-to-face time with the patient discussing conditions above, post visit ordering of testing, clinical documentation with the electronic health record, making appropriate referrals as documented, and communicating necessary findings to members of  the patients care team.    Rubye Oaksammy Kimoni Pickerill, NP 07/02/2020

## 2020-07-02 NOTE — Patient Instructions (Addendum)
Continue on CPAP At bedtime   Work on healthy weight loss.  Order for Dreamwear Nasal mask- small  Healthy sleep regimen  Ambien As needed  insomnia  Follow up with Dr. Vassie Loll  in 1 year and As needed

## 2020-07-24 ENCOUNTER — Other Ambulatory Visit: Payer: Self-pay | Admitting: Family Medicine

## 2020-07-25 NOTE — Telephone Encounter (Signed)
Requesting: zolpidem and lorazepam Contract: none UDS:  none Last Visit: 03/08/20 Next Visit:09/04/20  Last Refill: 01/12/20  Please Advise

## 2020-08-23 IMAGING — US ULTRASOUND ABDOMEN COMPLETE
1 series · 13 of 25 positions shown · non-contrast
Comparison: Ultrasound 04/21/2014

CLINICAL DATA: Abnormal LFTs

EXAM:
ABDOMEN ULTRASOUND COMPLETE

[Series 1: ultrasound abdomen complete · 13 of 108 slices shown]
[im 1/108]
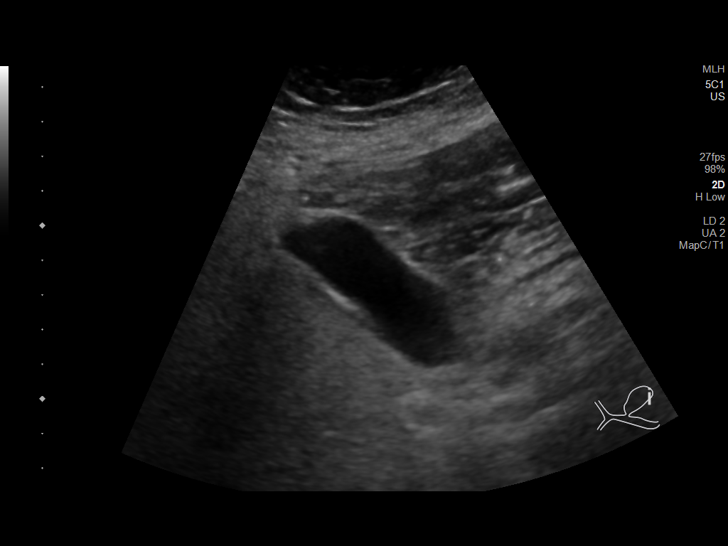
[im 9/108]
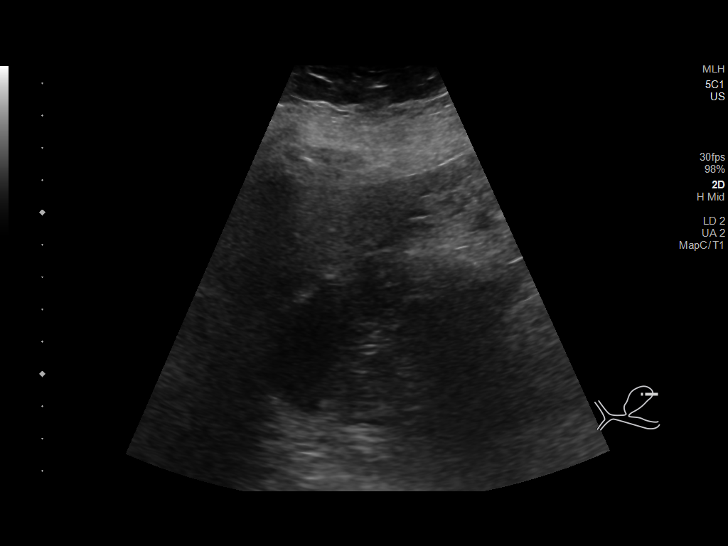
[im 18/108]
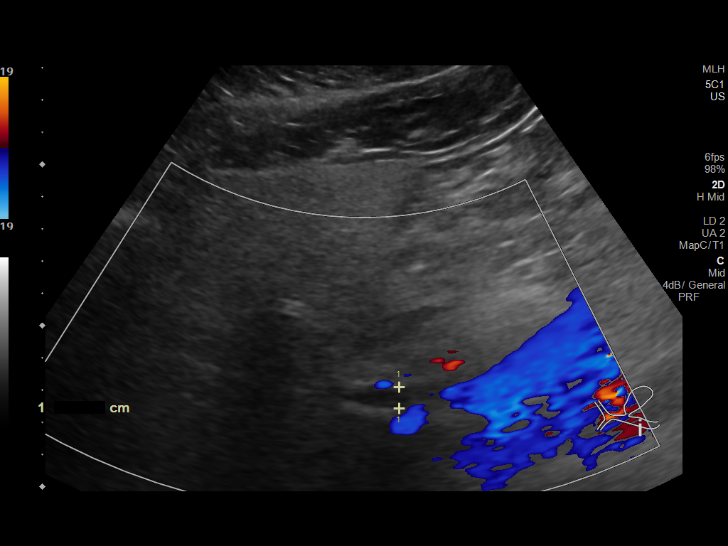
[im 27/108]
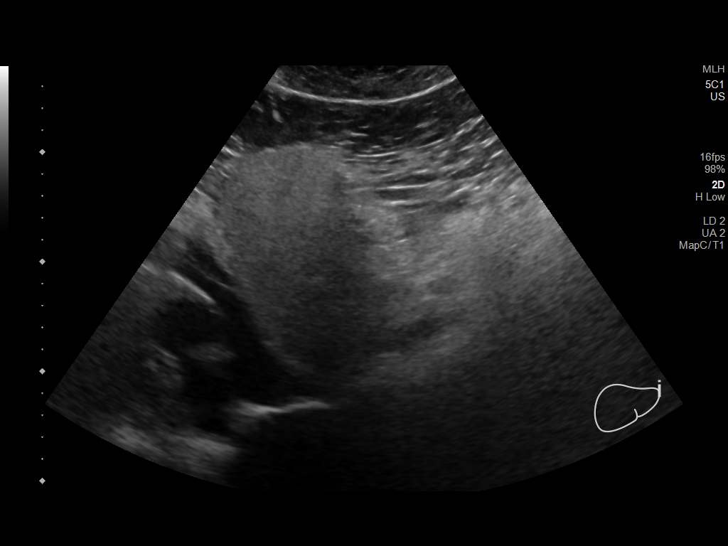
[im 36/108]
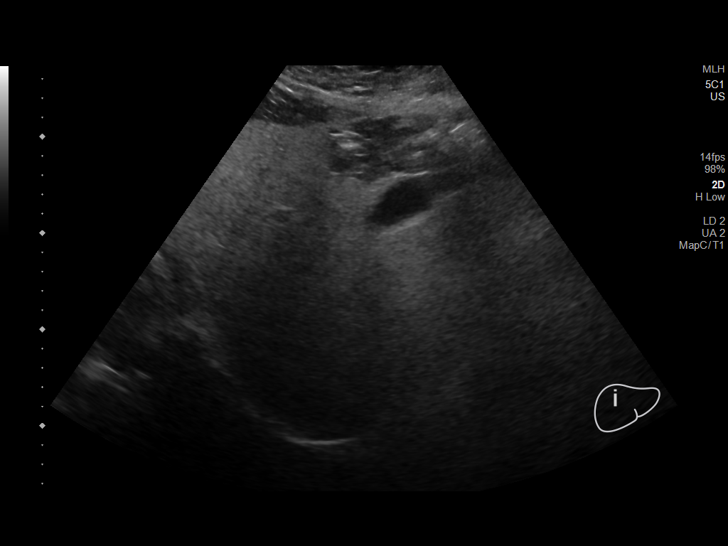
[im 45/108]
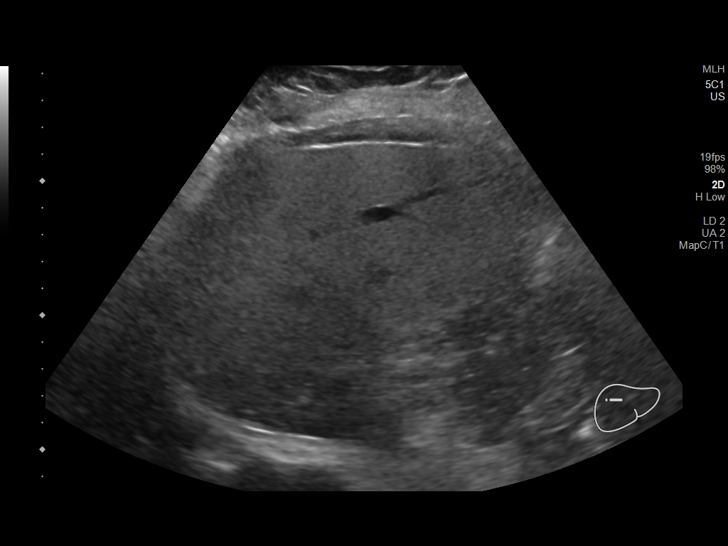
[im 54/108]
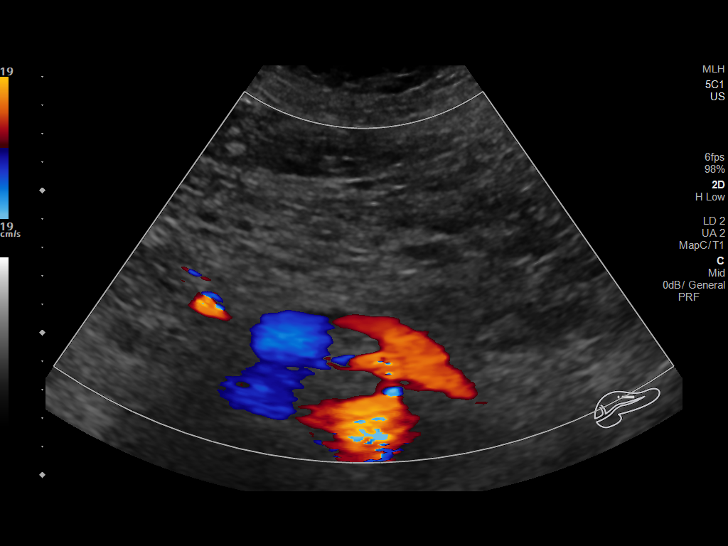
[im 63/108]
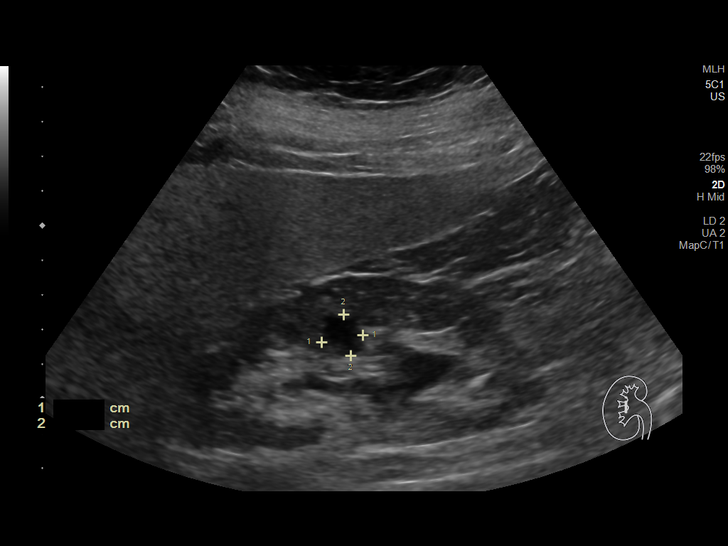
[im 72/108]
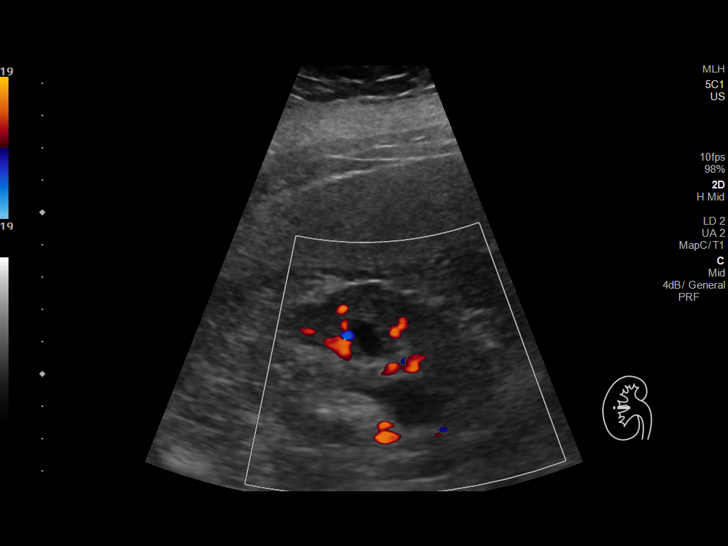
[im 81/108]
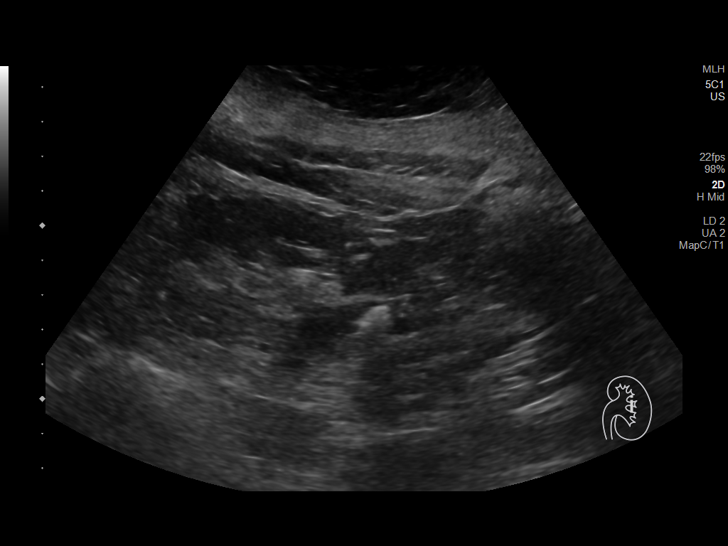
[im 90/108]
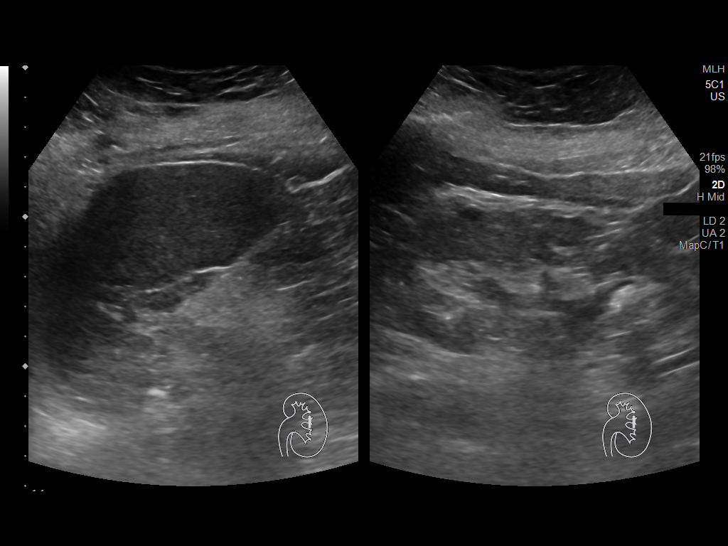
[im 99/108]
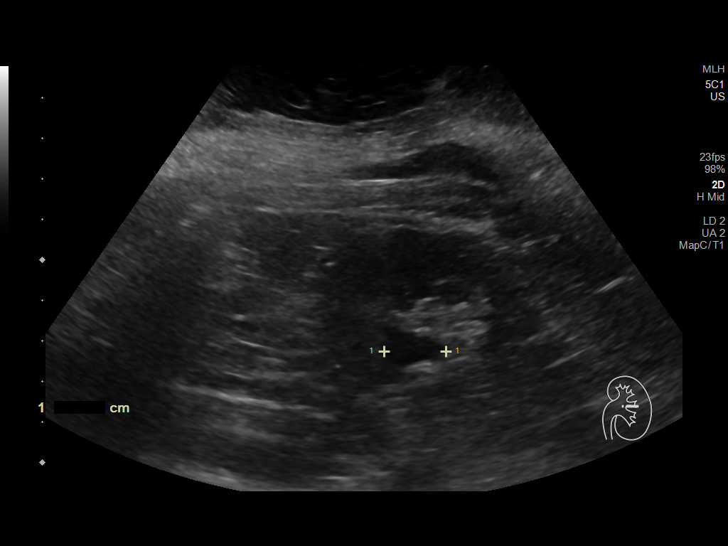
[im 108/108]
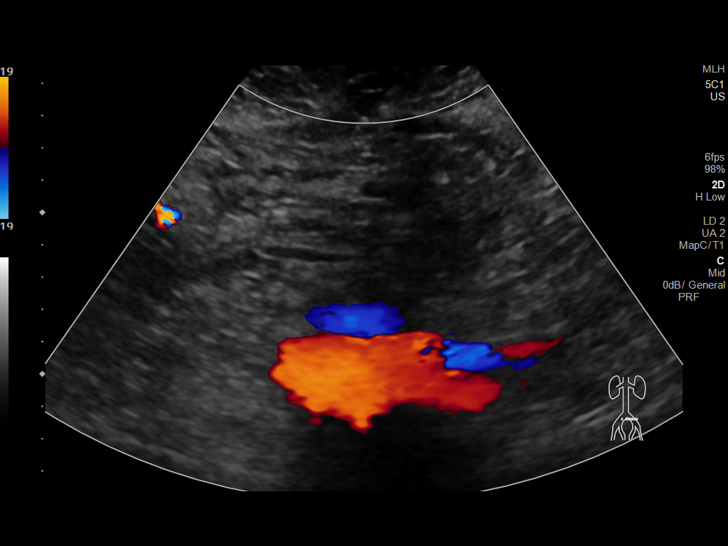

[13 of 25 positions shown; findings below may reference images not displayed]

FINDINGS: Gallbladder: No gallstones or wall thickening visualized. No
sonographic Murphy sign noted by sonographer.

Common bile duct: Diameter: 6.6 mm

Liver: Echogenic liver. Borderline to mildly enlarged liver with
length measurement of 18.7 cm. Portal vein is patent on color
Doppler imaging with normal direction of blood flow towards the
liver.

IVC: No abnormality visualized.

Pancreas: Visualized portion unremarkable.

Spleen: Size and appearance within normal limits.

Right Kidney: Length: 10.4 cm. Cortical echogenicity is within
normal limits. Mild right hydronephrosis. Cyst in the midpole
measuring 1.2 x 1.2 x 0.9 cm.

Left Kidney: Length: 11.6 cm. Slightly echogenic cortex. Mild left
hydronephrosis. Shadowing stone lower pole measuring 1 cm. Cyst in
the midpole measuring 1.2 x 1.1 x 1.5 cm.

Abdominal aorta: No aneurysm visualized.

Other findings: None.
IMPRESSION: 1. Echogenic slightly enlarged liver consistent with steatosis and
or hepatocellular disease. No focal hepatic abnormality.
2. Common bile duct diameter upper limits of normal
3. Mild bilateral hydronephrosis. Suspected 1 cm stone in the lower
pole of left kidney. Bilateral renal cysts
4. Slightly echogenic left kidney cortex suggesting medical renal
disease

## 2020-08-29 ENCOUNTER — Other Ambulatory Visit: Payer: 59

## 2020-09-03 ENCOUNTER — Other Ambulatory Visit: Payer: 59

## 2020-09-04 ENCOUNTER — Encounter: Payer: 59 | Admitting: Family Medicine

## 2020-09-11 ENCOUNTER — Other Ambulatory Visit (HOSPITAL_BASED_OUTPATIENT_CLINIC_OR_DEPARTMENT_OTHER): Payer: Self-pay | Admitting: Family Medicine

## 2020-09-11 DIAGNOSIS — Z1231 Encounter for screening mammogram for malignant neoplasm of breast: Secondary | ICD-10-CM

## 2020-09-13 ENCOUNTER — Encounter: Payer: Self-pay | Admitting: Family Medicine

## 2020-09-18 ENCOUNTER — Encounter: Payer: Self-pay | Admitting: Family

## 2020-09-18 ENCOUNTER — Other Ambulatory Visit: Payer: Self-pay

## 2020-09-18 ENCOUNTER — Ambulatory Visit (INDEPENDENT_AMBULATORY_CARE_PROVIDER_SITE_OTHER): Payer: 59 | Admitting: Family

## 2020-09-18 VITALS — BP 116/78 | HR 61 | Temp 97.6°F | Ht 63.5 in | Wt 214.8 lb

## 2020-09-18 DIAGNOSIS — E559 Vitamin D deficiency, unspecified: Secondary | ICD-10-CM | POA: Diagnosis not present

## 2020-09-18 DIAGNOSIS — Z13220 Encounter for screening for lipoid disorders: Secondary | ICD-10-CM | POA: Diagnosis not present

## 2020-09-18 DIAGNOSIS — Z6837 Body mass index (BMI) 37.0-37.9, adult: Secondary | ICD-10-CM

## 2020-09-18 DIAGNOSIS — Z0000 Encounter for general adult medical examination without abnormal findings: Secondary | ICD-10-CM | POA: Diagnosis not present

## 2020-09-18 LAB — LIPID PANEL
Cholesterol: 144 mg/dL (ref 0–200)
HDL: 42.6 mg/dL (ref >39.00)
LDL Cholesterol: 75 mg/dL (ref 0–99)
NonHDL: 101.59
Total CHOL/HDL Ratio: 3
Triglycerides: 133 mg/dL (ref 0.0–149.0)
VLDL: 26.6 mg/dL (ref 0.0–40.0)

## 2020-09-18 LAB — TSH: TSH: 1.21 u[IU]/mL (ref 0.35–5.50)

## 2020-09-18 LAB — HEMOGLOBIN A1C: Hgb A1c MFr Bld: 5.7 % (ref 4.6–6.5)

## 2020-09-18 LAB — VITAMIN D 25 HYDROXY (VIT D DEFICIENCY, FRACTURES): VITD: 34.71 ng/mL (ref 30.00–100.00)

## 2020-09-18 NOTE — Progress Notes (Signed)
Emily Davenport is a 60 y.o. female with the following history as recorded in [employer]:  Patient Active Problem List   Diagnosis Date Noted   Hyperglycemia 11/21/2019   Headache 11/21/2019   Close exposure to COVID-19 virus 10/19/2019   Acute urinary tract infection 09/06/2019   History of urinary stone 09/06/2019   Hypertensive disorder 09/06/2019   Animal bite 03/30/2019   Open nondisplaced fracture of base of fifth metacarpal bone of left hand 03/30/2019   Snoring 12/27/2018   Rhinitis, chronic 12/27/2018   Aortic atherosclerosis (HCC) 12/05/2018   Sinusitis 12/05/2018   Restless sleeper 12/05/2018   OSA (obstructive sleep apnea) 12/05/2018   Low vitamin D level 08/18/2018   High serum parathyroid hormone (PTH) 08/18/2018   Obesity 08/18/2018   Benign essential HTN 09/30/2015   History of chicken pox 05/11/2015   H/O sinusitis 05/11/2015   Seasonal allergies 05/11/2015   Elevated LFTs 02/06/2014   Renal insufficiency 12/20/2013   Bilateral renal cysts 12/20/2013   Microscopic hematuria 12/20/2013   Tear of lateral meniscus of right knee    Insomnia 04/11/2013   Anxiety 03/28/2013   GERD (gastroesophageal reflux disease) 03/28/2013   Renal calculi 03/28/2013   Hyperlipidemia 03/28/2013   IBS (irritable bowel syndrome) 03/28/2013   Hx of endometriosis 03/28/2013   S/P total hysterectomy and BSO (bilateral salpingo-oophorectomy) 03/28/2013   Multiple sclerosis (HCC) 03/28/2013   Diverticulosis 03/28/2013    Current Outpatient Medications  Medication Sig Dispense Refill   atorvastatin (LIPITOR) 10 MG tablet Take 1 tablet (10 mg total) by mouth daily. 90 tablet 1   azelastine (ASTELIN) 0.1 % nasal spray azelastine 137 mcg (0.1 %) nasal spray aerosol     Cholecalciferol (VITAMIN D3) 50 MCG (2000 UT) TABS Take by mouth 1 day or 1 dose. 1 per day for 6 days     cloNIDine (CATAPRES) 0.1 MG tablet Take 1 tablet (0.1 mg total) by mouth 3 (three) times daily. 270 tablet 1    diphenhydrAMINE (SOMINEX) 25 MG tablet Take by mouth.     famotidine (PEPCID) 40 MG tablet Take 1 tablet (40 mg total) by mouth at bedtime as needed for heartburn or indigestion. 90 tablet 3   Ferrous Sulfate 140 (45 Fe) MG TBCR Take by mouth.     KRILL OIL PO Take 1 capsule by mouth daily.     LORazepam (ATIVAN) 1 MG tablet TAKE 1 TABLET BY MOUTH TWICE DAILY AS NEEDED FOR ANXIETY 180 tablet 1   metoprolol succinate (TOPROL-XL) 100 MG 24 hr tablet TAKE 1 TABLET BY MOUTH DAILY IN THE MORNING AND AT BEDTIME. TAKE WITH OR IMMEDIATELY FOLLOWING A MEAL 180 tablet 1   montelukast (SINGULAIR) 10 MG tablet Take 10 mg by mouth at bedtime.     nystatin ointment (MYCOSTATIN) Apply 1 application topically 2 (two) times daily. 30 g 3   Probiotic Product (PROBIOTIC DAILY) CAPS Take by mouth.     zolpidem (AMBIEN CR) 12.5 MG CR tablet TAKE 1 TABLET BY MOUTH DAILY AT BEDTIME AS NEEDED FOR SLEEP 90 tablet 0   aspirin EC 81 MG tablet Take 81 mg by mouth daily. (Patient not taking: Reported on 09/18/2020)     No current facility-administered medications for this visit.    Allergies: Patient has no known allergies.  Past Medical History:  Diagnosis Date   Allergies    Anxiety    Arthritis    Benign essential HTN 09/30/2015   Bilateral swelling of feet    Borderline hypertension  Depression    Fatty liver    GERD (gastroesophageal reflux disease)    H/O sinusitis 05/11/2015   High serum parathyroid hormone (PTH) 08/18/2018   History of chicken pox 05/11/2015   History of chicken pox 05/11/2015   Hx of endometriosis 03/28/2013   Hyperlipidemia    IBS (irritable bowel syndrome)    Insomnia    Kidney problem    Knee pain    Multiple sclerosis (HCC)    PKD (polycystic kidney disease)    Renal insufficiency 12/20/2013   Seasonal allergies 05/11/2015   Tear of lateral meniscus of right knee    Vitamin D deficiency     Past Surgical History:  Procedure Laterality Date   BLADDER SURGERY  2009   sling    COLONOSCOPY     Finger Joint Surgery     KNEE ARTHROSCOPY WITH MEDIAL MENISECTOMY Right 08/15/2013   Procedure: RIGHT KNEE ARTHROSCOPY WITH DEBRIDEMENT/SHAVING (CHRONDRPLASTY), MEDIAL AND LATERAL MENISECTOMY;  Surgeon: Nilda Simmer, MD;  Location: [facility];  Service: Orthopedics;  Laterality: Right;   NASAL SINUS SURGERY  2000   planterfaciitis  1998   right   RHINOPLASTY  1979   UPPER GI ENDOSCOPY     VAGINAL HYSTERECTOMY  2010   WISDOM TOOTH EXTRACTION      Family History  Problem Relation Age of Onset   Kidney disease Mother    Osteoporosis Mother    COPD Mother    Hypertension Mother    Hyperlipidemia Mother    Obesity Mother    Hypertension Father    Cancer Father        throat   Kidney disease Father        kidney stones and cancer   Alzheimer's disease Father    Hyperlipidemia Father    Hypertension Sister    Prostate cancer Maternal Grandfather    Alzheimer's disease Paternal Grandmother    Drug abuse Paternal Grandmother    Heart disease Paternal Grandfather    Drug abuse Paternal Grandfather     Social History   Tobacco Use   Smoking status: Never   Smokeless tobacco: Never  Substance Use Topics   Alcohol use: Yes    Comment: occ    Subjective:   Patient presents for yearly CPE; is scheduled for gastric bypass next week but admits is on the fence/ has decided that she is most likely going to cancel the surgery. Feels like she can lose the weight on her own;   Up to date on colonoscopy; mammogram scheduled for October 2022;   Review of Systems  Constitutional:  Positive for weight loss.       Planned  HENT: Negative.    Eyes: Negative.   Respiratory: Negative.    Cardiovascular: Negative.   Gastrointestinal: Negative.   Genitourinary: Negative.   Musculoskeletal: Negative.   Skin: Negative.   Neurological: Negative.   Endo/Heme/Allergies: Negative.   Psychiatric/Behavioral: Negative.         Objective:  Vitals:    09/18/20 0822  BP: 116/78  Pulse: 61  Temp: 97.6 F (36.4 C)  TempSrc: Oral  SpO2: 99%  Weight: 214 lb 12.8 oz (97.4 kg)  Height: 5' 3.5" (1.613 m)    General: Well developed, well nourished, in no acute distress  Skin : Warm and dry.  Head: Normocephalic and atraumatic  Eyes: Sclera and conjunctiva clear; pupils round and reactive to light; extraocular movements intact  Ears: External normal; canals clear; tympanic membranes normal  Oropharynx: Pink, supple. No suspicious lesions  Neck: Supple without thyromegaly, adenopathy  Lungs: Respirations unlabored; clear to auscultation bilaterally without wheeze, rales, rhonchi  CVS exam: normal rate and regular rhythm.  Abdomen: Soft; nontender; nondistended; normoactive bowel sounds; no masses or hepatosplenomegaly  Musculoskeletal: No deformities; no active joint inflammation  Extremities: No edema, cyanosis, clubbing  Vessels: Symmetric bilaterally  Neurologic: Alert and oriented; speech intact; face symmetrical; moves all extremities well; CNII-XII intact without focal deficit   Assessment:  1. PE (physical exam), annual   2. BMI 37.0-37.9, adult   3. Lipid screening   4. Vitamin D deficiency     Plan:  Age appropriate preventive healthcare needs addressed; encouraged regular eye doctor and dental exams; encouraged regular exercise; will update labs and refills as needed today; follow-up to be determined; She has decided to cancel her weight loss surgery and is interested in Healthy Weight and Wellness; she will work on goal of losing 10 pounds in the next 3 months and follow up with her PCP for re-check.  Congratulated patient on commitment to her health;  This visit occurred during the SARS-CoV-2 public health emergency.  Safety protocols were in place, including screening questions prior to the visit, additional usage of staff PPE, and extensive cleaning of exam room while observing appropriate contact time as indicated for  disinfecting solutions.    No follow-ups on file.  Orders Placed This Encounter  Procedures   Lipid panel   VITAMIN D 25 Hydroxy (Vit-D Deficiency, Fractures)   TSH   Amb Ref to Medical Weight Management    Referral Priority:   Routine    Referral Type:   Consultation    Number of Visits Requested:   1    Requested Prescriptions    No prescriptions requested or ordered in this encounter

## 2020-09-18 NOTE — Patient Instructions (Addendum)
[email or web address]  Please look at this website to see about the program;    Please see her in 3 months for follow up; goal weight would be around 204 or lower at that appointment;

## 2020-09-19 ENCOUNTER — Encounter: Payer: Self-pay | Admitting: Family

## 2020-09-19 ENCOUNTER — Encounter: Payer: Self-pay | Admitting: Family Medicine

## 2020-10-18 ENCOUNTER — Encounter: Payer: Self-pay | Admitting: Family Medicine

## 2020-10-22 ENCOUNTER — Other Ambulatory Visit: Payer: Self-pay | Admitting: Family Medicine

## 2020-10-23 NOTE — Telephone Encounter (Signed)
Requesting: Ambien 12.5mg  Contract: None UDS: None Last Visit:09/18/2020 Next Visit: 01/22/2021 Last Refill: 07/25/2020 #90 and 0RF  Please Advise

## 2020-10-24 ENCOUNTER — Other Ambulatory Visit: Payer: Self-pay | Admitting: Family Medicine

## 2020-10-29 ENCOUNTER — Other Ambulatory Visit: Payer: Self-pay

## 2020-10-29 ENCOUNTER — Encounter (HOSPITAL_BASED_OUTPATIENT_CLINIC_OR_DEPARTMENT_OTHER): Payer: Self-pay

## 2020-10-29 ENCOUNTER — Ambulatory Visit (HOSPITAL_BASED_OUTPATIENT_CLINIC_OR_DEPARTMENT_OTHER)
Admission: RE | Admit: 2020-10-29 | Discharge: 2020-10-29 | Disposition: A | Discharge status: Home / Self Care | Payer: 59 | Source: Ambulatory Visit | Attending: Family Medicine | Admitting: Family Medicine

## 2020-10-29 DIAGNOSIS — Z1231 Encounter for screening mammogram for malignant neoplasm of breast: Secondary | ICD-10-CM | POA: Diagnosis not present

## 2021-01-17 ENCOUNTER — Other Ambulatory Visit: Payer: Self-pay | Admitting: Family Medicine

## 2021-01-18 ENCOUNTER — Other Ambulatory Visit: Payer: Self-pay | Admitting: Family Medicine

## 2021-01-18 ENCOUNTER — Telehealth: Payer: Self-pay

## 2021-01-18 MED ORDER — LORAZEPAM 1 MG PO TABS
1.0000 mg | ORAL_TABLET | Freq: Two times a day (BID) | ORAL | 0 refills | Status: DC | PRN
Start: 2021-01-18 — End: 2021-02-18

## 2021-01-18 NOTE — Telephone Encounter (Signed)
Can you send in lorazepam for the pt

## 2021-01-22 ENCOUNTER — Ambulatory Visit: Payer: 59 | Admitting: Family Medicine

## 2021-02-16 ENCOUNTER — Other Ambulatory Visit: Payer: Self-pay | Admitting: Family Medicine

## 2021-02-18 NOTE — Telephone Encounter (Signed)
Requesting: lorazepam 1mg   Contract: None UDS: None Last Visit: 09/18/2020 w/ Mickel Baas Next Visit: 05/16/2021 Last Refill: 01/18/2021 #60 and 0RF  Please Advise

## 2021-03-07 IMAGING — CR DG HAND COMPLETE 3+V*L*
3 series · 3 of 3 positions shown · non-contrast
Comparison: None.

CLINICAL DATA: Dog bite left hand third through fifth metacarpals

EXAM:
LEFT HAND - COMPLETE 3+ VIEW

[x hand pa left]
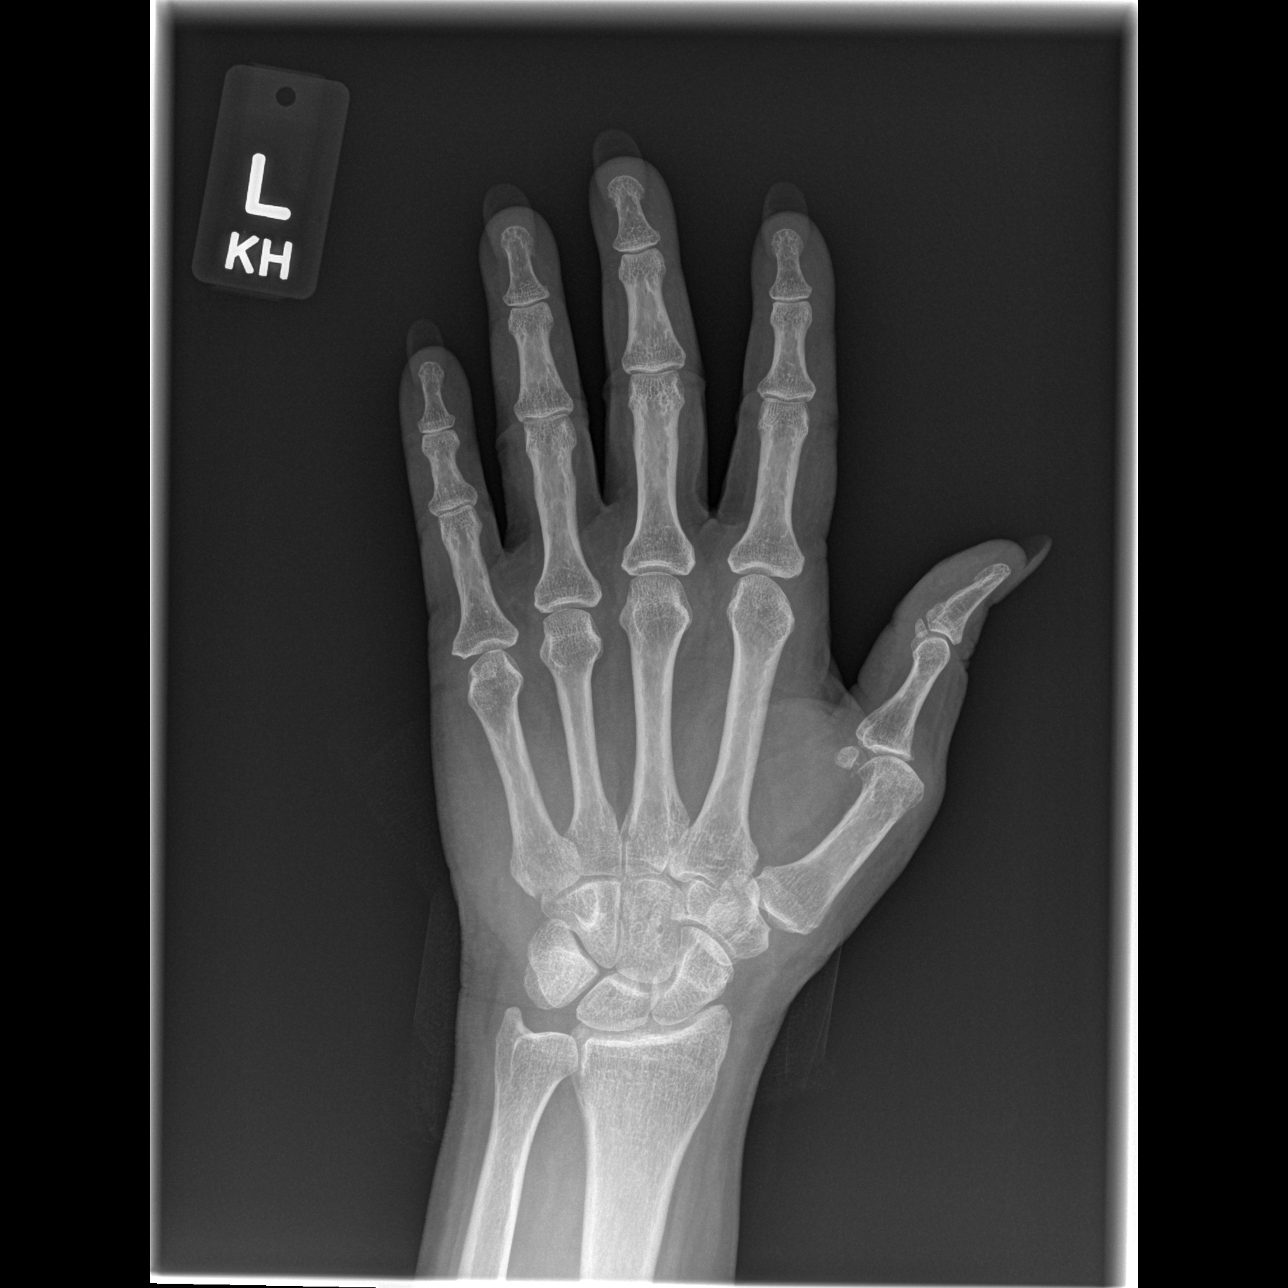

[x hand oblique left]
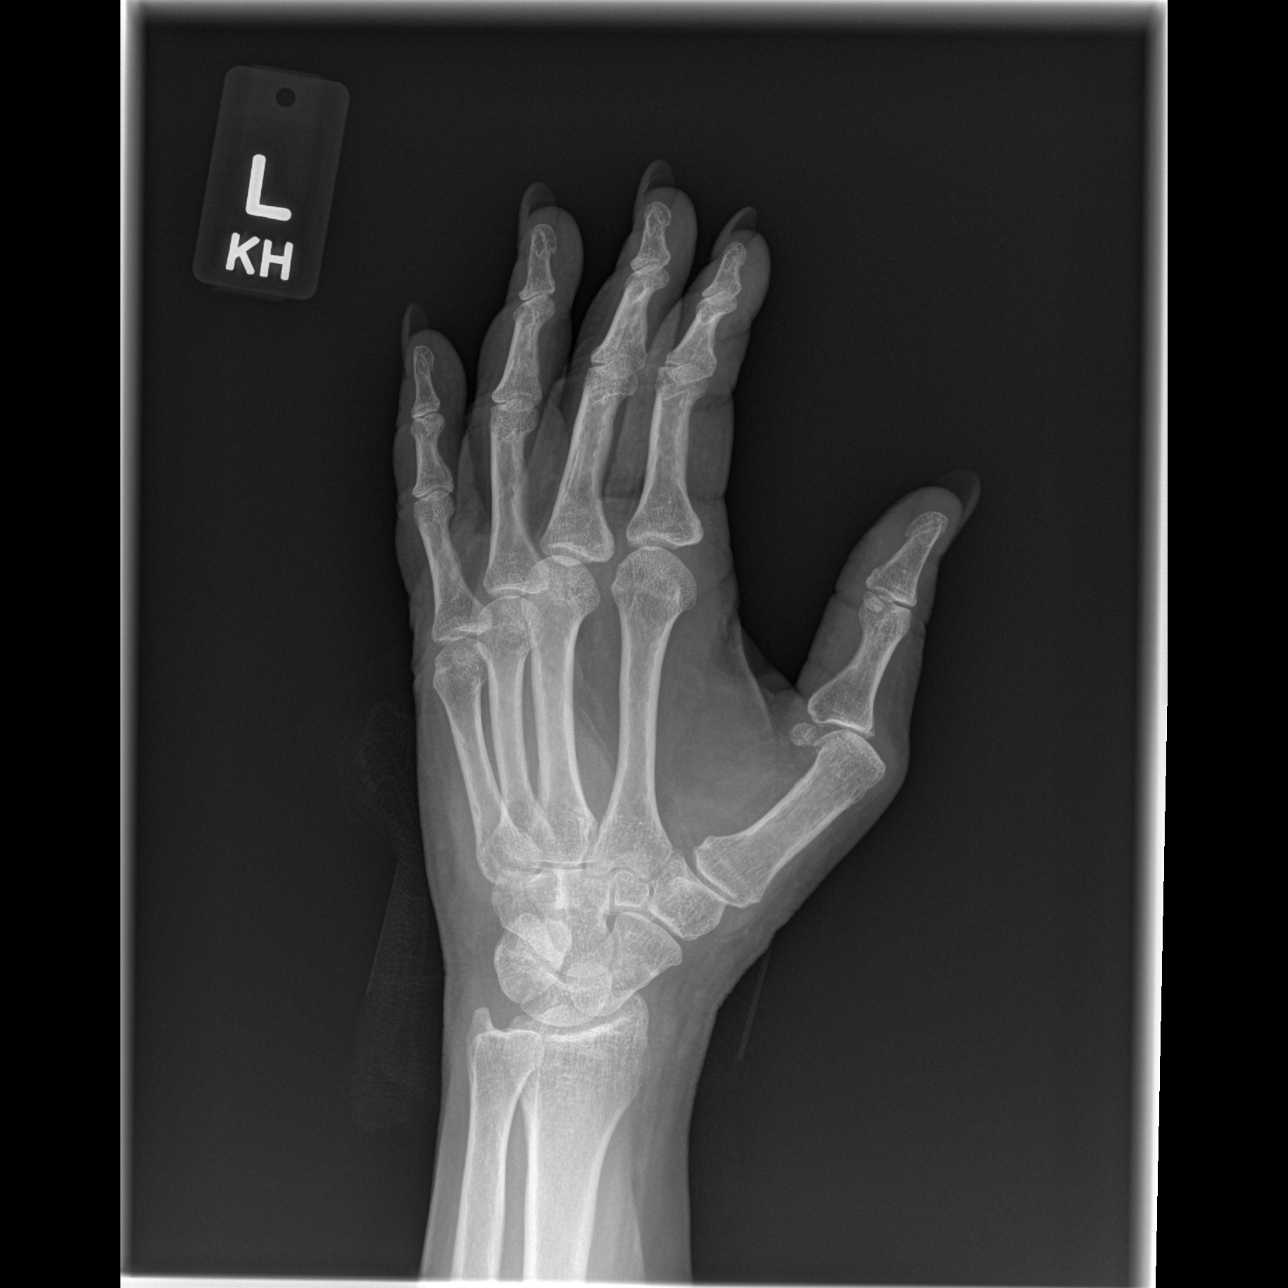

[x hand lat left]
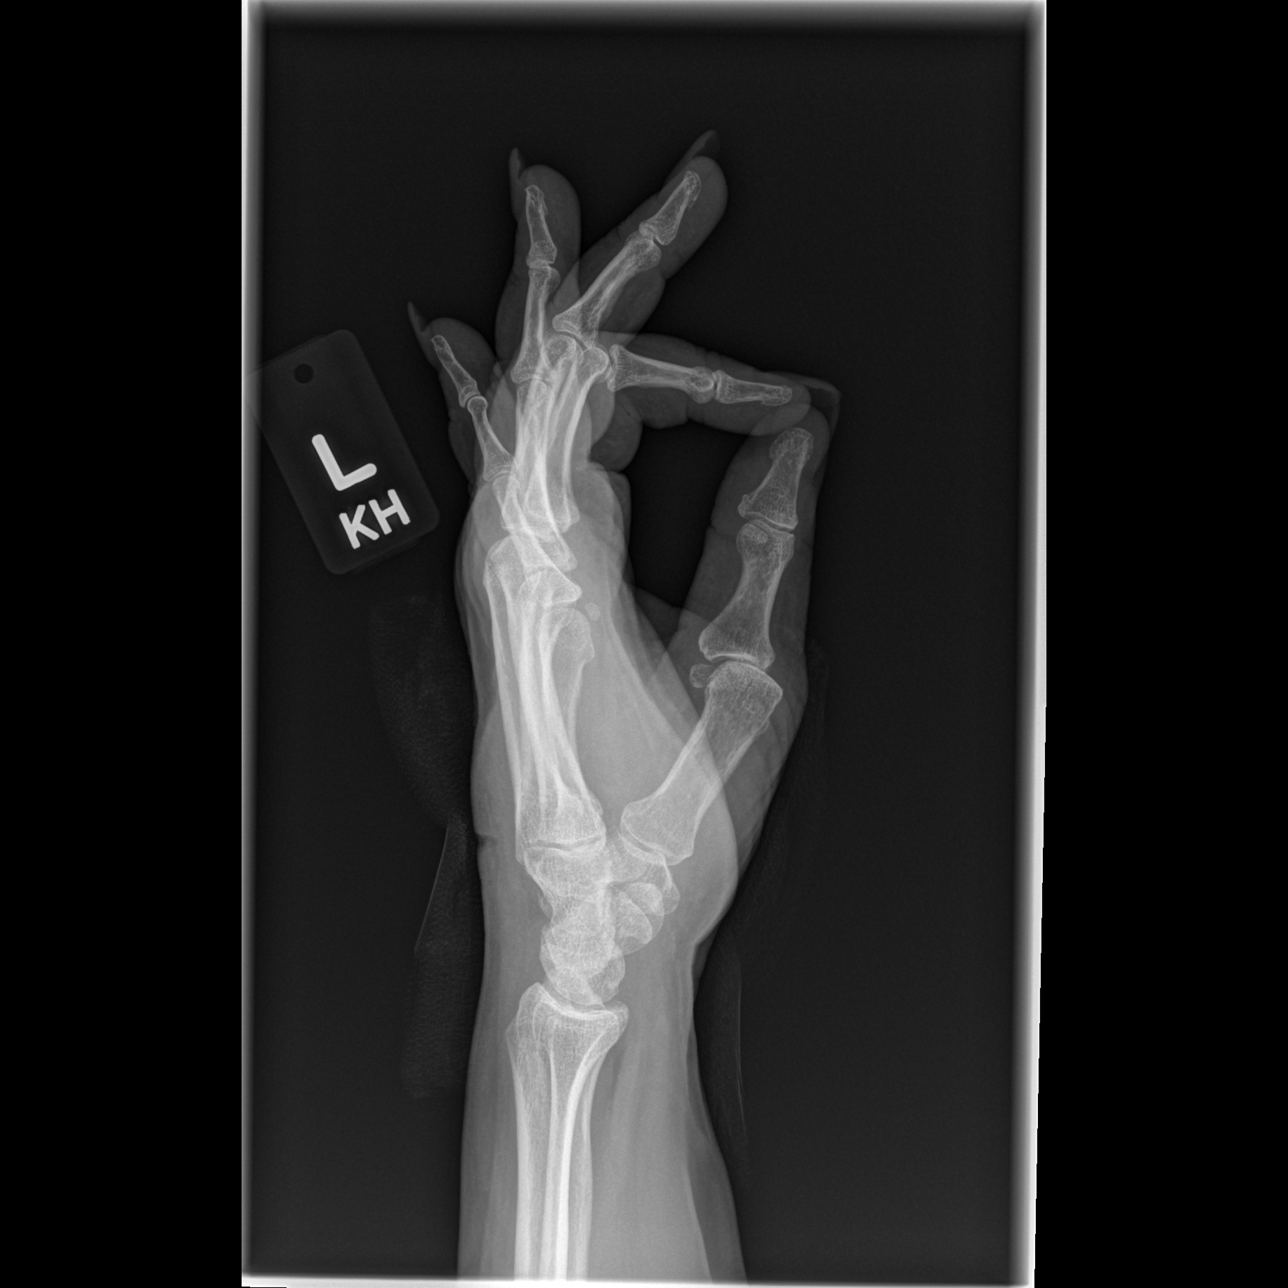

[3 of 3 positions shown; findings below may reference images not displayed]

FINDINGS: Frontal, oblique, and lateral views of the left hand are obtained.
Soft tissue swelling is seen ulnar aspect left hand and wrist. No
radiopaque foreign bodies. No underlying fractures.

There is significant osteoarthritis of the first metacarpophalangeal
joint, with radial subluxation of the first proximal phalanx.
IMPRESSION: 1. Soft tissue swelling ulnar aspect of the hand and wrist. No
underlying fracture or radiopaque foreign body.
2. Osteoarthritis of the first metacarpal phalangeal joint.

## 2021-03-24 IMAGING — US US RENAL
1 series · 14 of 25 positions shown · non-contrast
Comparison: Ultrasound abdomen 04/21/2014

CLINICAL DATA: Proteinuria.  Nephrolithiasis.

EXAM:
RENAL / URINARY TRACT ULTRASOUND COMPLETE

[Series 1: us renal · 46 acquisitions, 14 frames shown]
[im 1/46]
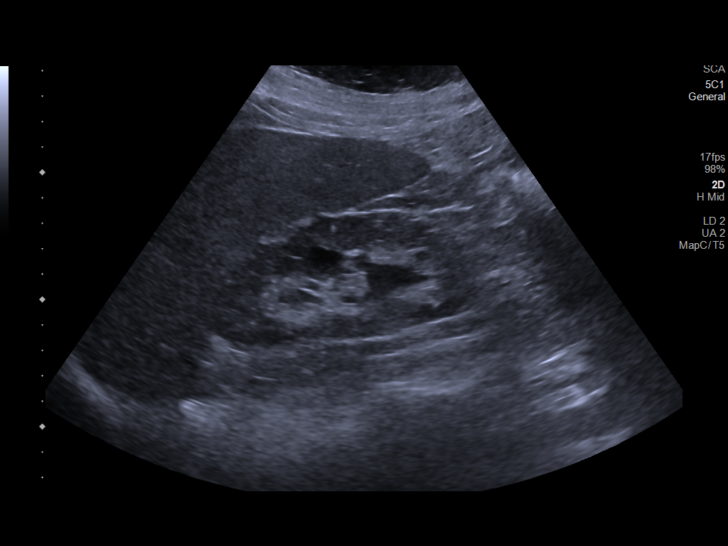
[im 4/46]
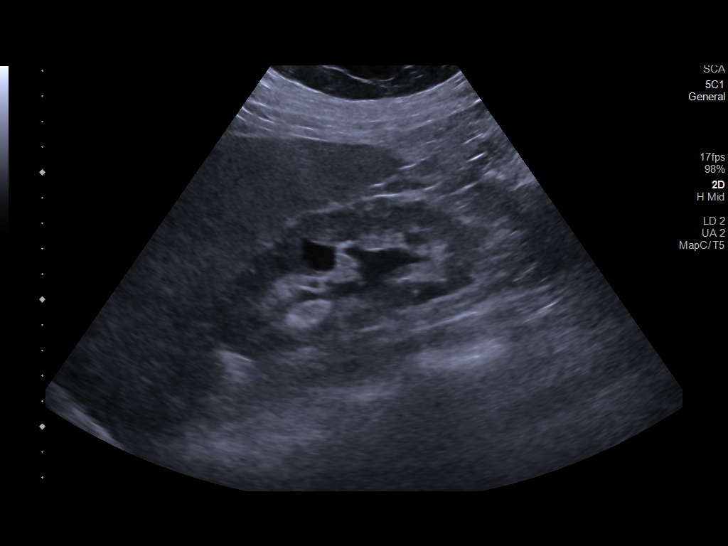
[im 8/46]
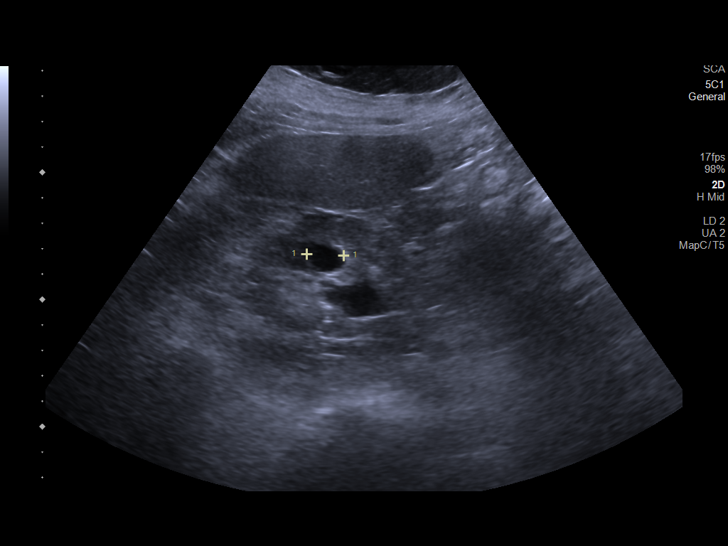
[im 12/46]
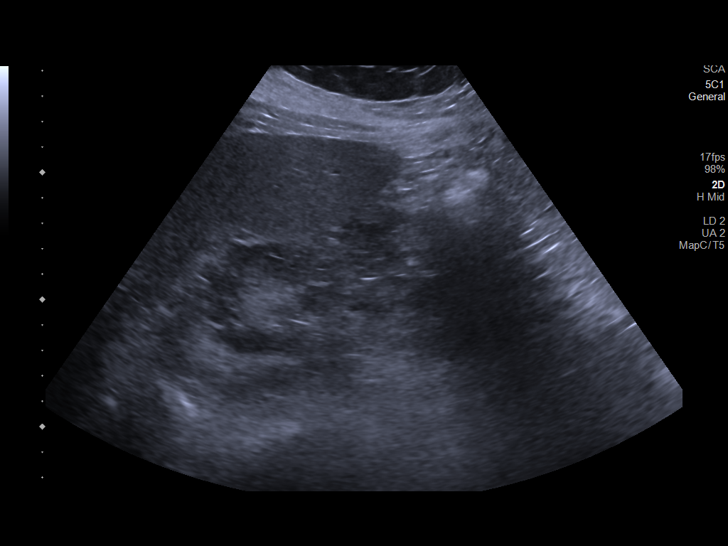
[im 16/46]
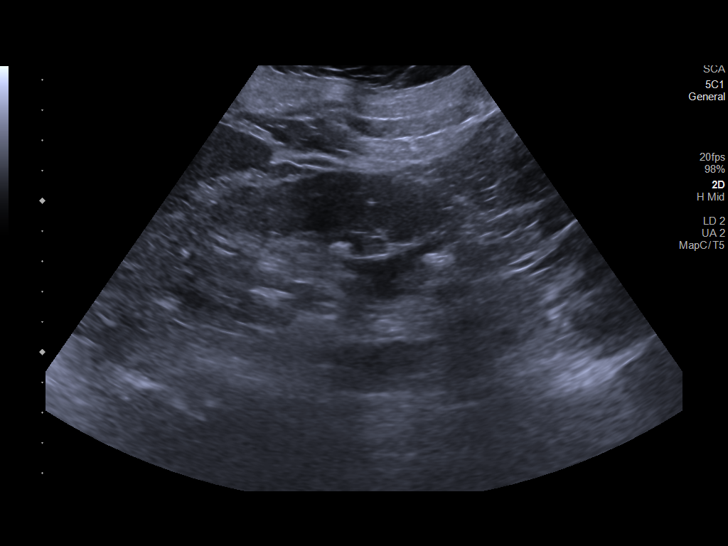
[im 17/46]
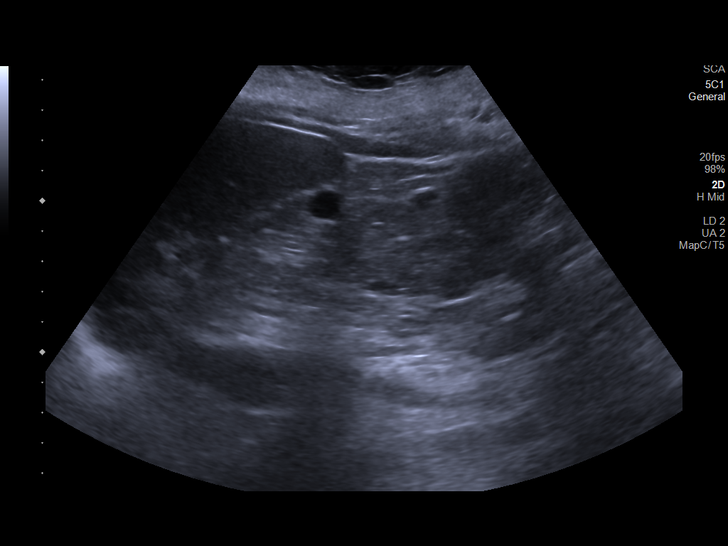
[im 21/46]
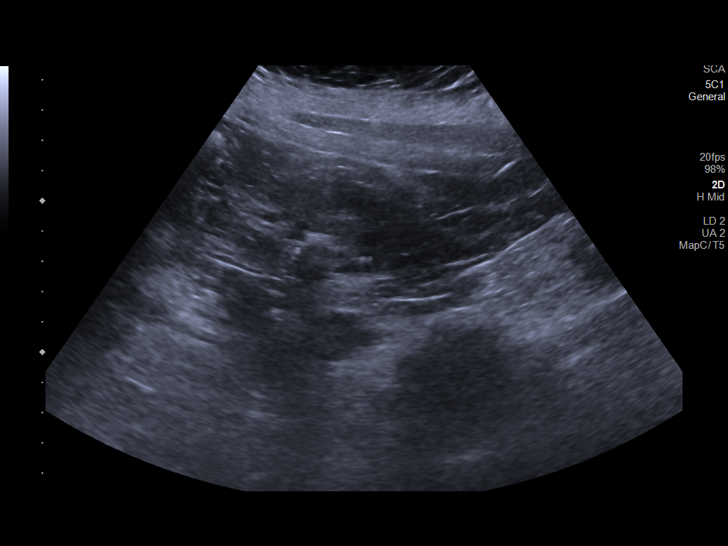
[im 25/46]
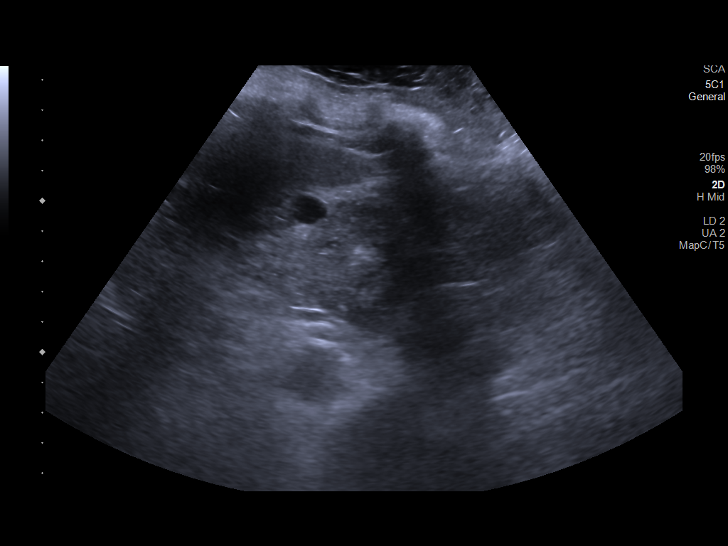
[im 29/46]
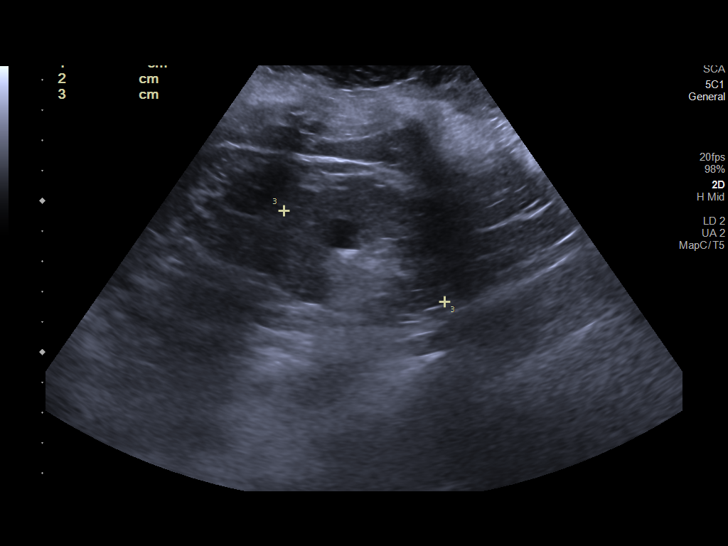
[im 31/46]
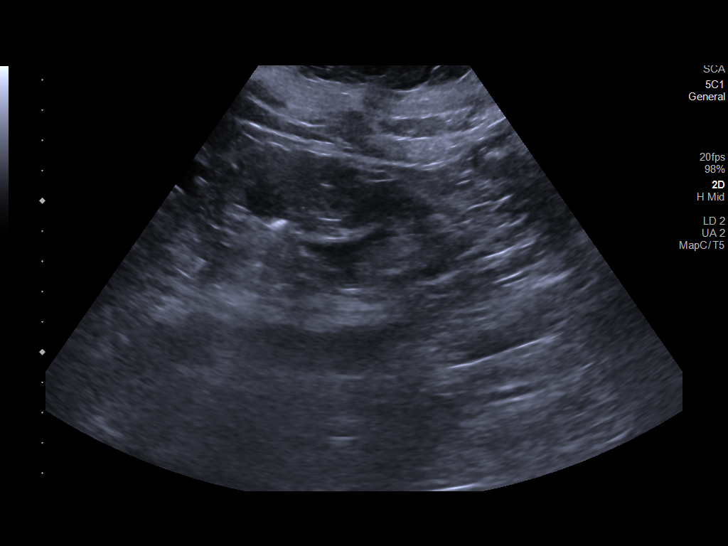
[im 34/46]
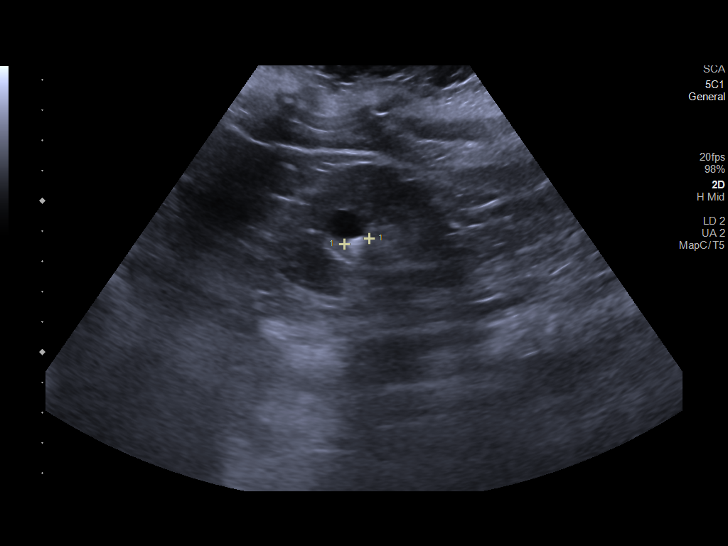
[im 38/46]
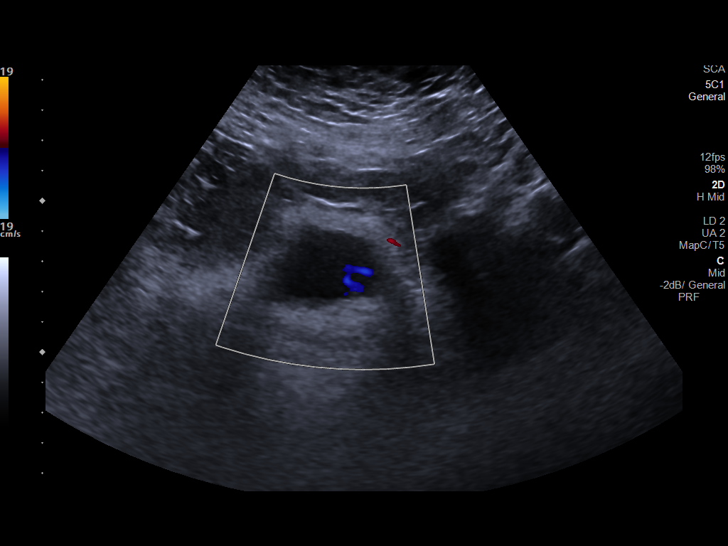
[im 42/46]
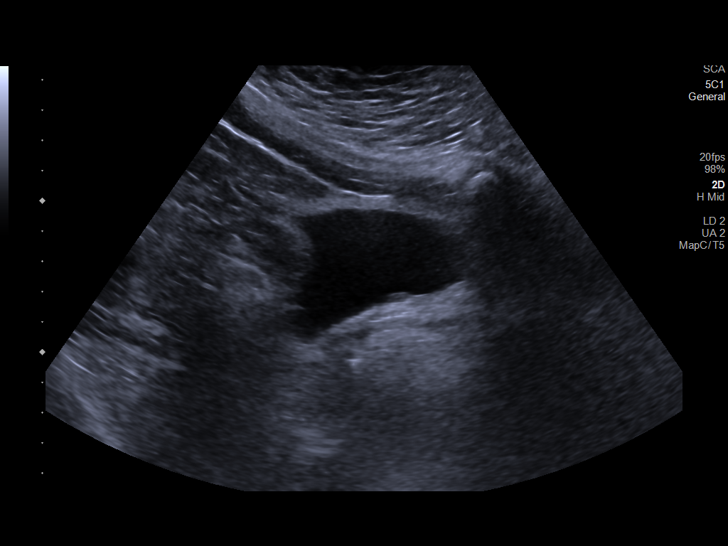
[im 46/46]
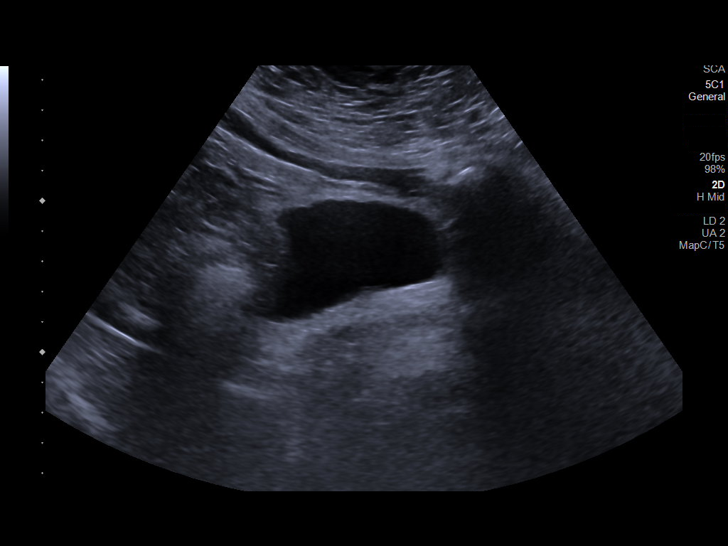

[14 of 25 positions shown; findings below may reference images not displayed]

FINDINGS: Right Kidney:

Renal measurements: 10.6 x 5.0 x 5.6 cm = volume: 158 mL. Mild to
moderate hydronephrosis. Renal cortical echogenicity normal. 13 mm
upper pole cyst.

Left Kidney:

Renal measurements: 11.6 x 4.9 x 6.0 cm = volume: 180 mL. Mild
mm upper pole cyst.

Bladder:

Small nonshadowing focus in the posterior bladder could represent
debris or a polyp. This measures 9 x 7 mm.

Other:

None.
IMPRESSION: Bilateral hydronephrosis right greater than left. No postvoid
imaging was obtained of the urinary bladder was not significantly
distended. Correlate with symptoms of renal colic.

14 mm lower pole calculus on the left.  Bilateral renal cyst

7 x 9 mm echogenic focus in the bladder which is not shadow. This
could represent debris or a polyp in the bladder.

## 2021-04-12 ENCOUNTER — Other Ambulatory Visit: Payer: Self-pay | Admitting: Family Medicine

## 2021-04-12 NOTE — Telephone Encounter (Signed)
Requesting: Ambien CR 12.5mg   ?Contract: None ?UDS: None ?Last Visit: 09/18/2020 w/ Vernona Rieger ?Next Visit: None ?Last Refill: 10/23/2020 #90 and 1RF ? ?Please Advise ? ?

## 2021-04-13 ENCOUNTER — Other Ambulatory Visit: Payer: Self-pay | Admitting: Family Medicine

## 2021-04-15 NOTE — Telephone Encounter (Signed)
Patient has an appointment on 07/25/21 ?

## 2021-05-13 ENCOUNTER — Other Ambulatory Visit: Payer: Self-pay | Admitting: Family Medicine

## 2021-05-13 NOTE — Telephone Encounter (Signed)
Requesting: Emily Davenport ?Contract: none ?UDS: none ?Last Visit: 09/18/20 ?Next Visit: 07/25/21 ?Last Refill: 02/18/21, 04/13/21 ? ?Please Advise  ?

## 2021-05-16 ENCOUNTER — Ambulatory Visit: Payer: 59 | Admitting: Family Medicine

## 2021-06-12 ENCOUNTER — Other Ambulatory Visit: Payer: Self-pay | Admitting: Family Medicine

## 2021-06-12 NOTE — Telephone Encounter (Signed)
Requesting:ambien 12.5mg  Contract:unknown   PE:[id number] Last Visit:09/18/20 Next Visit:07/25/21 Last Refill:05/13/21  Please Advise

## 2021-07-01 ENCOUNTER — Other Ambulatory Visit: Payer: Self-pay | Admitting: Family Medicine

## 2021-07-10 ENCOUNTER — Other Ambulatory Visit: Payer: Self-pay | Admitting: Family Medicine

## 2021-07-10 NOTE — Telephone Encounter (Signed)
Last OV--09/18/2020 Next scheduled upcoming with PCP ---  07/25/2021 Last RF---06/12/2021   #30 no refills PCP out of the office today.

## 2021-07-18 ENCOUNTER — Other Ambulatory Visit: Payer: Self-pay | Admitting: Family Medicine

## 2021-07-25 ENCOUNTER — Encounter: Payer: Self-pay | Admitting: Family Medicine

## 2021-07-25 ENCOUNTER — Ambulatory Visit: Payer: 59 | Admitting: Family Medicine

## 2021-07-25 VITALS — BP 114/80 | HR 57 | Resp 20 | Ht 63.5 in | Wt 232.4 lb

## 2021-07-25 DIAGNOSIS — G4733 Obstructive sleep apnea (adult) (pediatric): Secondary | ICD-10-CM

## 2021-07-25 DIAGNOSIS — R739 Hyperglycemia, unspecified: Secondary | ICD-10-CM

## 2021-07-25 DIAGNOSIS — G35 Multiple sclerosis: Secondary | ICD-10-CM

## 2021-07-25 DIAGNOSIS — G35D Multiple sclerosis, unspecified: Secondary | ICD-10-CM

## 2021-07-25 DIAGNOSIS — E559 Vitamin D deficiency, unspecified: Secondary | ICD-10-CM | POA: Diagnosis not present

## 2021-07-25 DIAGNOSIS — E785 Hyperlipidemia, unspecified: Secondary | ICD-10-CM

## 2021-07-25 DIAGNOSIS — I10 Essential (primary) hypertension: Secondary | ICD-10-CM | POA: Diagnosis not present

## 2021-07-25 DIAGNOSIS — R7989 Other specified abnormal findings of blood chemistry: Secondary | ICD-10-CM

## 2021-07-25 DIAGNOSIS — Z6839 Body mass index (BMI) 39.0-39.9, adult: Secondary | ICD-10-CM

## 2021-07-25 DIAGNOSIS — E66812 Obesity, class 2: Secondary | ICD-10-CM

## 2021-07-25 DIAGNOSIS — F5101 Primary insomnia: Secondary | ICD-10-CM

## 2021-07-25 DIAGNOSIS — N289 Disorder of kidney and ureter, unspecified: Secondary | ICD-10-CM

## 2021-07-25 DIAGNOSIS — Z79899 Other long term (current) drug therapy: Secondary | ICD-10-CM

## 2021-07-25 MED ORDER — CLONIDINE HCL 0.1 MG PO TABS
0.1000 mg | ORAL_TABLET | Freq: Three times a day (TID) | ORAL | 1 refills | Status: DC
Start: 2021-07-25 — End: 2021-11-11

## 2021-07-25 NOTE — Patient Instructions (Addendum)
Real Lemon packets and orange, grapefruit, lime for water  Mediterranean Diet A Mediterranean diet refers to food and lifestyle choices that are based on the traditions of countries located on the [employer]. It focuses on eating more fruits, vegetables, whole grains, beans, nuts, seeds, and heart-healthy fats, and eating less dairy, meat, eggs, and processed foods with added sugar, salt, and fat. This way of eating has been shown to help prevent certain conditions and improve outcomes for people who have chronic diseases, like kidney disease and heart disease. What are tips for following this plan? Reading food labels Check the serving size of packaged foods. For foods such as rice and pasta, the serving size refers to the amount of cooked product, not dry. Check the total fat in packaged foods. Avoid foods that have saturated fat or trans fats. Check the ingredient list for added sugars, such as corn syrup. Shopping  Buy a variety of foods that offer a balanced diet, including: Fresh fruits and vegetables (produce). Grains, beans, nuts, and seeds. Some of these may be available in unpackaged forms or large amounts (in bulk). Fresh seafood. Poultry and eggs. Low-fat dairy products. Buy whole ingredients instead of prepackaged foods. Buy fresh fruits and vegetables in-season from local farmers markets. Buy plain frozen fruits and vegetables. If you do not have access to quality fresh seafood, buy precooked frozen shrimp or canned fish, such as tuna, salmon, or sardines. Stock your pantry so you always have certain foods on hand, such as olive oil, canned tuna, canned tomatoes, rice, pasta, and beans. Cooking Cook foods with extra-virgin olive oil instead of using butter or other vegetable oils. Have meat as a side dish, and have vegetables or grains as your main dish. This means having meat in small portions or adding small amounts of meat to foods like pasta or stew. Use beans or  vegetables instead of meat in common dishes like chili or lasagna. Experiment with different cooking methods. Try roasting, broiling, steaming, and sauting vegetables. Add frozen vegetables to soups, stews, pasta, or rice. Add nuts or seeds for added healthy fats and plant protein at each meal. You can add these to yogurt, salads, or vegetable dishes. Marinate fish or vegetables using olive oil, lemon juice, garlic, and fresh herbs. Meal planning Plan to eat one vegetarian meal one day each week. Try to work up to two vegetarian meals, if possible. Eat seafood two or more times a week. Have healthy snacks readily available, such as: Vegetable sticks with hummus. Greek yogurt. Fruit and nut trail mix. Eat balanced meals throughout the week. This includes: Fruit: 2-3 servings a day. Vegetables: 4-5 servings a day. Low-fat dairy: 2 servings a day. Fish, poultry, or lean meat: 1 serving a day. Beans and legumes: 2 or more servings a week. Nuts and seeds: 1-2 servings a day. Whole grains: 6-8 servings a day. Extra-virgin olive oil: 3-4 servings a day. Limit red meat and sweets to only a few servings a month. Lifestyle  Cook and eat meals together with your family, when possible. Drink enough fluid to keep your urine pale yellow. Be physically active every day. This includes: Aerobic exercise like running or swimming. Leisure activities like gardening, walking, or housework. Get 7-8 hours of sleep each night. If recommended by your health care provider, drink red wine in moderation. This means 1 glass a day for nonpregnant women and 2 glasses a day for men. A glass of wine equals 5 oz (150 mL). What foods should  I eat? Fruits Apples. Apricots. Avocado. Berries. Bananas. Cherries. Dates. Figs. Grapes. Lemons. Melon. Oranges. Peaches. Plums. Pomegranate. Vegetables Artichokes. Beets. Broccoli. Cabbage. Carrots. Eggplant. Green beans. Chard. Kale. Spinach. Onions. Leeks. Peas. Squash.  Tomatoes. Peppers. Radishes. Grains Whole-grain pasta. Brown rice. Bulgur wheat. Polenta. Couscous. Whole-wheat bread. Orpah Cobb. Meats and other proteins Beans. Almonds. Sunflower seeds. Pine nuts. Peanuts. Cod. Salmon. Scallops. Shrimp. Tuna. Tilapia. Clams. Oysters. Eggs. Poultry without skin. Dairy Low-fat milk. Cheese. Greek yogurt. Fats and oils Extra-virgin olive oil. Avocado oil. Grapeseed oil. Beverages Water. Red wine. Herbal tea. Sweets and desserts Greek yogurt with honey. Baked apples. Poached pears. Trail mix. Seasonings and condiments Basil. Cilantro. Coriander. Cumin. Mint. Parsley. Sage. Rosemary. Tarragon. Garlic. Oregano. Thyme. Pepper. Balsamic vinegar. Tahini. Hummus. Tomato sauce. Olives. Mushrooms. The items listed above may not be a complete list of foods and beverages you can eat. Contact a dietitian for more information. What foods should I limit? This is a list of foods that should be eaten rarely or only on special occasions. Fruits Fruit canned in syrup. Vegetables Deep-fried potatoes (french fries). Grains Prepackaged pasta or rice dishes. Prepackaged cereal with added sugar. Prepackaged snacks with added sugar. Meats and other proteins Beef. Pork. Lamb. Poultry with skin. Hot dogs. Tomasa Blase. Dairy Ice cream. Sour cream. Whole milk. Fats and oils Butter. Canola oil. Vegetable oil. Beef fat (tallow). Lard. Beverages Juice. Sugar-sweetened soft drinks. Beer. Liquor and spirits. Sweets and desserts Cookies. Cakes. Pies. Candy. Seasonings and condiments Mayonnaise. Pre-made sauces and marinades. The items listed above may not be a complete list of foods and beverages you should limit. Contact a dietitian for more information. Summary The Mediterranean diet includes both food and lifestyle choices. Eat a variety of fresh fruits and vegetables, beans, nuts, seeds, and whole grains. Limit the amount of red meat and sweets that you eat. If recommended  by your health care provider, drink red wine in moderation. This means 1 glass a day for nonpregnant women and 2 glasses a day for men. A glass of wine equals 5 oz (150 mL). This information is not intended to replace advice given to you by your health care provider. Make sure you discuss any questions you have with your health care provider. Document Revised: 02/11/2019 Document Reviewed: 12/09/2018 Elsevier Patient Education  2023 [employer].

## 2021-07-25 NOTE — Assessment & Plan Note (Signed)
Supplement and hydrate

## 2021-07-25 NOTE — Assessment & Plan Note (Signed)
Encouraged DASH or MIND diet, decrease po intake and increase exercise as tolerated. Needs 7-8 hours of sleep nightly. Avoid trans fats, eat small, frequent meals every 4-5 hours with lean proteins, complex carbs and healthy fats. Minimize simple carbs, high fat foods and processed foods. Starting on Wegovy 0.25 mg weekly x 4 weeks and let us know if she is tolerating.

## 2021-07-25 NOTE — Assessment & Plan Note (Addendum)
Supplement and monitor 

## 2021-07-25 NOTE — Progress Notes (Signed)
Subjective:    Patient ID: Emily Davenport, female    DOB: [birth year], 61 y.o.   MRN: [id number]  Chief Complaint  Patient presents with   Follow-up    HPI Patient is in today for follow up on chronic medical concerns. No recent febrile illness or acute hospitalizations. No complaints of polyuria or polydipsia. She is trying to stay active and maintain a low carbohydrate diet. Denies CP/palp/SOB/HA/congestion/fevers/GI or GU c/o. Taking meds as prescribed. She notes some persistent fatigue despite starting on CPAP nightly  Past Medical History:  Diagnosis Date   Allergies    Anxiety    Arthritis    Benign essential HTN 09/30/2015   Bilateral swelling of feet    Borderline hypertension    Depression    Fatty liver    GERD (gastroesophageal reflux disease)    H/O sinusitis 05/11/2015   High serum parathyroid hormone (PTH) 08/18/2018   History of chicken pox 05/11/2015   History of chicken pox 05/11/2015   Hx of endometriosis 03/28/2013   Hyperlipidemia    IBS (irritable bowel syndrome)    Insomnia    Kidney problem    Knee pain    Multiple sclerosis (HCC)    PKD (polycystic kidney disease)    Renal insufficiency 12/20/2013   Seasonal allergies 05/11/2015   Tear of lateral meniscus of right knee    Vitamin D deficiency     Past Surgical History:  Procedure Laterality Date   BLADDER SURGERY  2009   sling   COLONOSCOPY     Finger Joint Surgery     KNEE ARTHROSCOPY WITH MEDIAL MENISECTOMY Right 08/15/2013   Procedure: RIGHT KNEE ARTHROSCOPY WITH DEBRIDEMENT/SHAVING (CHRONDRPLASTY), MEDIAL AND LATERAL MENISECTOMY;  Surgeon: Nilda Simmer, MD;  Location: [facility];  Service: Orthopedics;  Laterality: Right;   NASAL SINUS SURGERY  2000   planterfaciitis  1998   right   RHINOPLASTY  1979   UPPER GI ENDOSCOPY     VAGINAL HYSTERECTOMY  2010   WISDOM TOOTH EXTRACTION      Family History  Problem Relation Age of Onset   Kidney disease Mother    Osteoporosis  Mother    COPD Mother    Hypertension Mother    Hyperlipidemia Mother    Obesity Mother    Hypertension Father    Cancer Father        throat   Kidney disease Father        kidney stones and cancer   Alzheimer's disease Father    Hyperlipidemia Father    Hypertension Sister    Prostate cancer Maternal Grandfather    Alzheimer's disease Paternal Grandmother    Drug abuse Paternal Grandmother    Heart disease Paternal Grandfather    Drug abuse Paternal Grandfather     Social History   Socioeconomic History   Marital status: Married    Spouse name: Not on file   Number of children: Not on file   Years of education: Not on file   Highest education level: Not on file  Occupational History   Not on file  Tobacco Use   Smoking status: Never   Smokeless tobacco: Never  Vaping Use   Vaping Use: Never used  Substance and Sexual Activity   Alcohol use: Yes    Comment: occ   Drug use: No   Sexual activity: Not on file  Other Topics Concern   Not on file  Social History Narrative   Retired [employer]  department, lives with husband   Social Determinants of Health   Financial Resource Strain: Not on file  Food Insecurity: Not on file  Transportation Needs: Not on file  Physical Activity: Not on file  Stress: Not on file  Social Connections: Not on file  Intimate Partner Violence: Not on file    Outpatient Medications Prior to Visit  Medication Sig Dispense Refill   aspirin EC 81 MG tablet Take 81 mg by mouth daily.     atorvastatin (LIPITOR) 10 MG tablet TAKE 1 TABLET BY MOUTH DAILY 90 tablet 0   azelastine (ASTELIN) 0.1 % nasal spray azelastine 137 mcg (0.1 %) nasal spray aerosol     Cholecalciferol (VITAMIN D3) 50 MCG (2000 UT) TABS Take by mouth 1 day or 1 dose. 1 per day for 6 days     famotidine (PEPCID) 40 MG tablet TAKE 1 TABLET BY MOUTH EACH NIGHT AT BEDTIME AS NEEDED FOR HEARTBURN OR INDIGESTION 90 tablet 3   Ferrous Sulfate 140 (45 Fe) MG TBCR Take by  mouth.     KRILL OIL PO Take 1 capsule by mouth daily.     LORazepam (ATIVAN) 1 MG tablet TAKE 1 TABLET BY MOUTH TWICE DAILY AS NEEDED FOR ANXIETY 60 tablet 2   metoprolol succinate (TOPROL-XL) 100 MG 24 hr tablet TAKE 1 TABLET BY MOUTH DAILY IN THE MORNING AND AT BEDTIME. TAKE WITH OR IMMEDIATELY FOLLOWING A MEAL 180 tablet 1   Multiple Vitamin (MULTIVITAMIN) tablet Take 1 tablet by mouth daily.     nystatin ointment (MYCOSTATIN) APPLY TOPICALLY TWICE DAILY (Patient taking differently: as needed.) 30 g 3   Probiotic Product (PROBIOTIC DAILY) CAPS Take by mouth.     zolpidem (AMBIEN CR) 12.5 MG CR tablet TAKE 1 TABLET BY MOUTH DAILY AT BEDTIME AS NEEDED FOR SLEEP 30 tablet 0   cloNIDine (CATAPRES) 0.1 MG tablet TAKE 1 TABLET BY MOUTH THREE TIMES DAILY 270 tablet 0   diphenhydrAMINE (SOMINEX) 25 MG tablet Take by mouth.     montelukast (SINGULAIR) 10 MG tablet Take 10 mg by mouth at bedtime.     No facility-administered medications prior to visit.    No Known Allergies  Review of Systems  Constitutional:  Positive for malaise/fatigue. Negative for fever.  HENT:  Negative for congestion.   Eyes:  Negative for blurred vision.  Respiratory:  Negative for shortness of breath.   Cardiovascular:  Negative for chest pain, palpitations and leg swelling.  Gastrointestinal:  Negative for abdominal pain, blood in stool and nausea.  Genitourinary:  Negative for dysuria and frequency.  Musculoskeletal:  Negative for falls.  Skin:  Negative for rash.  Neurological:  Negative for dizziness, loss of consciousness and headaches.  Endo/Heme/Allergies:  Negative for environmental allergies.  Psychiatric/Behavioral:  Negative for depression. The patient is not nervous/anxious.        Objective:    Physical Exam Constitutional:      General: She is not in acute distress.    Appearance: She is well-developed.  HENT:     Head: Normocephalic and atraumatic.  Eyes:     Conjunctiva/sclera:  Conjunctivae normal.  Neck:     Thyroid: No thyromegaly.  Cardiovascular:     Rate and Rhythm: Normal rate and regular rhythm.     Heart sounds: Normal heart sounds. No murmur heard. Pulmonary:     Effort: Pulmonary effort is normal. No respiratory distress.     Breath sounds: Normal breath sounds.  Abdominal:     General:  Bowel sounds are normal. There is no distension.     Palpations: Abdomen is soft. There is no mass.     Tenderness: There is no abdominal tenderness.  Musculoskeletal:     Cervical back: Neck supple.  Lymphadenopathy:     Cervical: No cervical adenopathy.  Skin:    General: Skin is warm and dry.  Neurological:     Mental Status: She is alert and oriented to person, place, and time.  Psychiatric:        Behavior: Behavior normal.     BP 114/80 (BP Location: Left Arm, Patient Position: Sitting, Cuff Size: Large)   Pulse (!) 57   Resp 20   Ht 5' 3.5" (1.613 m)   Wt 232 lb 6.4 oz (105.4 kg)   SpO2 98%   BMI 40.52 kg/m  Wt Readings from Last 3 Encounters:  07/25/21 232 lb 6.4 oz (105.4 kg)  09/18/20 214 lb 12.8 oz (97.4 kg)  12/27/19 228 lb 6 oz (103.6 kg)    Diabetic Foot Exam - Simple   No data filed    Lab Results  Component Value Date   WBC 8.1 03/05/2020   HGB 14.2 03/05/2020   HCT 42.4 03/05/2020   PLT 224.0 03/05/2020   GLUCOSE 103 (H) 03/05/2020   CHOL 144 09/18/2020   TRIG 133.0 09/18/2020   HDL 42.60 09/18/2020   LDLCALC 75 09/18/2020   ALT 22 03/05/2020   AST 16 03/05/2020   NA 138 03/05/2020   K 3.7 03/05/2020   CL 102 03/05/2020   CREATININE 0.97 03/05/2020   BUN 27 (H) 03/05/2020   CO2 27 03/05/2020   TSH 1.21 09/18/2020   HGBA1C 5.7 09/18/2020    Lab Results  Component Value Date   TSH 1.21 09/18/2020   Lab Results  Component Value Date   WBC 8.1 03/05/2020   HGB 14.2 03/05/2020   HCT 42.4 03/05/2020   MCV 85.9 03/05/2020   PLT 224.0 03/05/2020   Lab Results  Component Value Date   NA 138 03/05/2020   K 3.7  03/05/2020   CO2 27 03/05/2020   GLUCOSE 103 (H) 03/05/2020   BUN 27 (H) 03/05/2020   CREATININE 0.97 03/05/2020   BILITOT 0.4 03/05/2020   ALKPHOS 123 (H) 03/05/2020   AST 16 03/05/2020   ALT 22 03/05/2020   PROT 7.3 03/05/2020   ALBUMIN 4.3 03/05/2020   CALCIUM 9.9 03/05/2020   GFR 63.91 03/05/2020   Lab Results  Component Value Date   CHOL 144 09/18/2020   Lab Results  Component Value Date   HDL 42.60 09/18/2020   Lab Results  Component Value Date   LDLCALC 75 09/18/2020   Lab Results  Component Value Date   TRIG 133.0 09/18/2020   Lab Results  Component Value Date   CHOLHDL 3 09/18/2020   Lab Results  Component Value Date   HGBA1C 5.7 09/18/2020       Assessment & Plan:      Problem List Items Addressed This Visit     Hyperlipidemia    Encourage heart healthy diet such as MIND or DASH diet, increase exercise, avoid trans fats, simple carbohydrates and processed foods, consider a krill or fish or flaxseed oil cap daily. Tolerating Atorvastatin      Relevant Medications   cloNIDine (CATAPRES) 0.1 MG tablet   Other Relevant Orders   Lipid panel   Multiple sclerosis (HCC)    No recent flare or progression is noted.  Renal insufficiency    Supplement and hydrate      Benign essential HTN - Primary    Well controlled, no changes to meds. Encouraged heart healthy diet such as the DASH diet and exercise as tolerated. She has been on Clonidine tid for years she is questioning if it is contributing to fatigue. She will stay on it for now and with persistent weight loss and likely blood pressure improvement. Clonidine will be the first medication we titrate off.       Relevant Medications   cloNIDine (CATAPRES) 0.1 MG tablet   Other Relevant Orders   CBC   Comprehensive metabolic panel   Low vitamin D level    Supplement and monitor      Relevant Orders   VITAMIN D 25 Hydroxy (Vit-D Deficiency, Fractures)   High serum parathyroid hormone  (PTH)   Relevant Orders   TSH   Obesity    Encouraged DASH or MIND diet, decrease po intake and increase exercise as tolerated. Needs 7-8 hours of sleep nightly. Avoid trans fats, eat small, frequent meals every 4-5 hours with lean proteins, complex carbs and healthy fats. Minimize simple carbs, high fat foods and processed foods. Starting on Wegovy 0.25 mg weekly x 4 weeks and let us know if she is tolerating.       OSA (obstructive sleep apnea)    On CPAP now and using it nightly.       Hyperglycemia    hgba1c acceptable, minimize simple carbs. Increase exercise as tolerated.       Relevant Orders   Hemoglobin A1c   Other Visit Diagnoses     Vitamin D deficiency       Relevant Orders   VITAMIN D 25 Hydroxy (Vit-D Deficiency, Fractures)   High risk medication use       Relevant Orders   Drug Monitoring Panel [phone] , Urine       I have discontinued Yaslene S. Heatley's diphenhydrAMINE and montelukast. I have also changed her cloNIDine. Additionally, I am having her maintain her Vitamin D3, Ferrous Sulfate, KRILL OIL PO, aspirin EC, Probiotic Daily, azelastine, metoprolol succinate, nystatin ointment, LORazepam, atorvastatin, zolpidem, famotidine, and multivitamin.  Meds ordered this encounter  Medications   cloNIDine (CATAPRES) 0.1 MG tablet    Sig: Take 1 tablet (0.1 mg total) by mouth 3 (three) times daily.    Dispense:  270 tablet    Refill:  1

## 2021-07-26 ENCOUNTER — Other Ambulatory Visit: Payer: Self-pay | Admitting: Family Medicine

## 2021-07-26 ENCOUNTER — Telehealth: Payer: Self-pay

## 2021-07-26 MED ORDER — WEGOVY 0.25 MG/0.5ML ~~LOC~~ SOAJ: 0.2500 mg | SUBCUTANEOUS | 0 refills | Status: DC

## 2021-07-26 NOTE — Telephone Encounter (Signed)
PA submitted for wegovy, awaiting reply from insurance company.

## 2021-07-26 NOTE — Assessment & Plan Note (Signed)
hgba1c acceptable, minimize simple carbs. Increase exercise as tolerated.  

## 2021-07-26 NOTE — Assessment & Plan Note (Addendum)
Well controlled, no changes to meds. Encouraged heart healthy diet such as the DASH diet and exercise as tolerated. She has been on Clonidine tid for years she is questioning if it is contributing to fatigue. She will stay on it for now and with persistent weight loss and likely blood pressure improvement. Clonidine will be the first medication we titrate off.

## 2021-07-26 NOTE — Assessment & Plan Note (Signed)
No recent flare or progression is noted.

## 2021-07-26 NOTE — Assessment & Plan Note (Signed)
Encourage heart healthy diet such as MIND or DASH diet, increase exercise, avoid trans fats, simple carbohydrates and processed foods, consider a krill or fish or flaxseed oil cap daily. Tolerating Atorvastatin 

## 2021-07-26 NOTE — Assessment & Plan Note (Signed)
On CPAP now and using it nightly.

## 2021-07-26 NOTE — Assessment & Plan Note (Signed)
Encouraged good sleep hygiene such as dark, quiet room. No blue/green glowing lights such as computer screens in bedroom. No alcohol or stimulants in evening. Cut down on caffeine as able. Regular exercise is helpful but not just prior to bed time. Ambien XR prn

## 2021-07-30 ENCOUNTER — Other Ambulatory Visit: Payer: Self-pay | Admitting: Family Medicine

## 2021-07-30 NOTE — Telephone Encounter (Signed)
Requesting: lorazepam 1mg   Contract: 07/25/21 UDS: Ordered 07/25/21 Last Visit: 07/25/21 Next Visit: 11/04/21 Last Refill: 05/13/21 #60 and 2RF  Please Advise

## 2021-07-30 NOTE — Telephone Encounter (Signed)
Requesting: Ambien CR 12.5mg   Contract: 07/25/21 UDS: Ordered 11/04/21 Last Visit: 07/25/21 Next Visit: 11/04/21 Last Refill: 07/10/21 #30 and 0RF  Please Advise

## 2021-07-31 NOTE — Telephone Encounter (Signed)
Message from plan: Request Reference Number: [id number]. WEGOVY INJ 0.25MG  is approved through 11/26/2021. Your patient may now fill this prescription and it will be covered.

## 2021-08-23 ENCOUNTER — Other Ambulatory Visit: Payer: Self-pay | Admitting: Family Medicine

## 2021-08-23 MED ORDER — WEGOVY 0.5 MG/0.5ML ~~LOC~~ SOAJ: 0.5000 mg | SUBCUTANEOUS | 0 refills | Status: DC

## 2021-08-26 ENCOUNTER — Other Ambulatory Visit: Payer: 59

## 2021-08-28 ENCOUNTER — Encounter (INDEPENDENT_AMBULATORY_CARE_PROVIDER_SITE_OTHER): Payer: Self-pay

## 2021-09-06 ENCOUNTER — Other Ambulatory Visit: Payer: Self-pay | Admitting: Family Medicine

## 2021-09-06 NOTE — Telephone Encounter (Signed)
Requesting:ambien 12.5 mg Contract:07/25/21 UDS:07/25/21 Last Visit:07/25/21 Next Visit:11/04/21 Last Refill:07/30/21  Please Advise

## 2021-09-21 ENCOUNTER — Other Ambulatory Visit: Payer: Self-pay | Admitting: Family Medicine

## 2021-09-24 ENCOUNTER — Other Ambulatory Visit: Payer: Self-pay | Admitting: Family Medicine

## 2021-09-24 MED ORDER — WEGOVY 1 MG/0.5ML ~~LOC~~ SOAJ: 1.0000 mg | SUBCUTANEOUS | 0 refills | Status: DC

## 2021-09-27 ENCOUNTER — Telehealth: Payer: Self-pay

## 2021-09-27 ENCOUNTER — Other Ambulatory Visit: Payer: Self-pay | Admitting: Family Medicine

## 2021-09-27 ENCOUNTER — Telehealth: Payer: Self-pay | Admitting: Family Medicine

## 2021-09-27 MED ORDER — WEGOVY 1.7 MG/0.75ML ~~LOC~~ SOAJ: 1.7000 mg | SUBCUTANEOUS | 1 refills | Status: DC

## 2021-09-27 NOTE — Telephone Encounter (Signed)
Called pt and she stated no side effects  She said her heart rate haven't increase any more. Shared she doing good.

## 2021-09-27 NOTE — Telephone Encounter (Signed)
Patient called to advise that Wegovy 1mg  is out of stock throughout the [place] and that [facility] told her they could order 1.7 mg, but she wants to know if Dr. is okay with her increasing to next dose. 1mg  dose could be out until November/December. Please call to advise her.

## 2021-09-27 NOTE — Telephone Encounter (Signed)
Done

## 2021-10-02 ENCOUNTER — Telehealth: Payer: Self-pay

## 2021-10-02 NOTE — Telephone Encounter (Signed)
Patient called to follow up from her call earlier. She is throwing up every 2 hours and just wanted to know if something could be called in. Advised that the nurse has routed it Dr. Abner Greenspan. Due to patient still throwing up, sent her to the Triage nurse. Please call patient to advise.

## 2021-10-02 NOTE — Telephone Encounter (Signed)
Done

## 2021-10-02 NOTE — Telephone Encounter (Signed)
Pt called and started wegovy yesterday and has been throwing up all day. She would like to kno what she should do.

## 2021-10-03 ENCOUNTER — Telehealth: Payer: Self-pay

## 2021-10-03 NOTE — Telephone Encounter (Signed)
Called pt and pt stated she went to urgent care. They gave her a shot of finnegan  and prescription for Zofran for nausea. Pt said feeling little better hasn't got sick since 10:30 last night. Advised pt  Call us if she needs to.

## 2021-10-03 NOTE — Telephone Encounter (Signed)
Pt doing better called here to check up on her and she went to urgent care got meds  Advised her call us if she need [place]. Told her you been out of the office.

## 2021-10-03 NOTE — Telephone Encounter (Signed)
Nurse Assessment Nurse: Daphine Deutscher, RN, Melanie Date/Time (Eastern Time): 10/02/2021 4:53:24 PM Confirm and document reason for call. If symptomatic, describe symptoms. ---Caller states she has been vomiting since this morning. she is vomiting every 2 hours now. Has urinated. No fever Does the patient have any new or worsening symptoms? ---Yes Will a triage be completed? ---Yes Related visit to physician within the last 2 weeks? ---No Does the PT have any chronic conditions? (i.e. diabetes, asthma, this includes High risk factors for pregnancy, etc.) ---Yes List chronic conditions. ---high blood pressure Is this a behavioral health or substance abuse call? ---No Guidelines Guideline Title Affirmed Question Affirmed Notes Nurse Date/Time (Eastern Time) Vomiting [1] MODERATE vomiting (e.g., 3 - 5 times/day) AND [2] age > 20 years Maralyn Sago 10/02/2021 4:55:25 PM PLEASE NOTE: All timestamps contained within this report are represented as [place] Standard Time. CONFIDENTIALTY NOTICE: This fax transmission is intended only for the addressee. It contains information that is legally privileged, confidential or otherwise protected from use or disclosure. If you are not the intended recipient, you are strictly prohibited from reviewing, disclosing, copying using or disseminating any of this information or taking any action in reliance on or regarding this information. If you have received this fax in error, please notify us immediately by telephone so that we can arrange for its return to [place]. Phone: [phone], Toll-Free: [phone], Fax: [phone] Page: 2 of 2 Call Id: [id number] Disp. Time Lamount Cohen Time) Disposition Final User 10/02/2021 4:59:25 PM Go to ED Now (or PCP triage) Yes Daphine Deutscher, RN, Melanie Final Disposition 10/02/2021 4:59:25 PM Go to ED Now (or PCP triage) Yes Daphine Deutscher, RN, Melanie Caller Disagree/Comply Comply Caller Understands Yes PreDisposition Call Doctor Care  Advice Given Per Guideline GO TO ED NOW (OR PCP TRIAGE): * IF NO PCP (PRIMARY CARE PROVIDER) SECOND-LEVEL TRIAGE: You need to be seen within the next hour. Go to the ED/UCC at _____________ Hospital. Leave as soon as you can. BRING MEDICINES: * Please bring a list of your current medicines when you go to see the doctor. BRING A BUCKET IN CASE OF VOMITING: * You may wish to bring a bucket, pan, or plastic bag with you in case there is more vomiting during the drive. CARE ADVICE per Vomiting (Adult) guideline. Referrals GO TO FACILITY OTHER - SPECIFY

## 2021-10-11 ENCOUNTER — Other Ambulatory Visit: Payer: Self-pay | Admitting: Family Medicine

## 2021-10-11 NOTE — Telephone Encounter (Signed)
Requesting: Ambien CR 12.5mg   Contract: 07/25/21 UDS: None Last Visit: 07/25/21 Next Visit:  12/31/21 Last Refill: 09/06/21 #30 and 0RF  Please Advise

## 2021-11-01 ENCOUNTER — Other Ambulatory Visit: Payer: Self-pay | Admitting: Family Medicine

## 2021-11-01 ENCOUNTER — Encounter: Payer: Self-pay | Admitting: Family Medicine

## 2021-11-01 ENCOUNTER — Other Ambulatory Visit: Payer: Self-pay

## 2021-11-01 MED ORDER — ONDANSETRON HCL 4 MG PO TABS: 4.0000 mg | ORAL_TABLET | Freq: Three times a day (TID) | ORAL | 1 refills | Status: DC | PRN

## 2021-11-01 MED ORDER — WEGOVY 0.5 MG/0.5ML ~~LOC~~ SOAJ: 0.5000 mg | SUBCUTANEOUS | 0 refills | Status: DC

## 2021-11-01 MED ORDER — WEGOVY 0.25 MG/0.5ML ~~LOC~~ SOAJ: 0.2500 mg | SUBCUTANEOUS | 0 refills | Status: DC

## 2021-11-01 NOTE — Telephone Encounter (Signed)
Pt stated she ok with 0.25 and 0.5 mg doze

## 2021-11-01 NOTE — Telephone Encounter (Signed)
Called pt was advised and states she understand

## 2021-11-04 ENCOUNTER — Ambulatory Visit: Payer: 59 | Admitting: Family Medicine

## 2021-11-11 ENCOUNTER — Other Ambulatory Visit: Payer: Self-pay | Admitting: Family Medicine

## 2021-12-07 ENCOUNTER — Other Ambulatory Visit: Payer: Self-pay | Admitting: Family Medicine

## 2021-12-09 NOTE — Telephone Encounter (Signed)
Requesting: AMBIEN CR  Contract: 07/25/2021 UDS: 07/25/2021 Last Visit: 07/25/2021 Next Visit: 12/31/2021 Last Refill: 10/14/2021  Please Advise

## 2021-12-10 ENCOUNTER — Other Ambulatory Visit: Payer: Self-pay | Admitting: Family Medicine

## 2021-12-17 ENCOUNTER — Telehealth: Payer: Self-pay

## 2021-12-17 ENCOUNTER — Other Ambulatory Visit: Payer: Self-pay

## 2021-12-17 NOTE — Telephone Encounter (Signed)
PA initiated via Covermymeds; KEY: [id number]. Awaiting determination.

## 2021-12-17 NOTE — Telephone Encounter (Signed)
PA denied because they did not receive 16 week f/u BMI. However, when submitting the PA I informed plan that d/t manufacturer back order Pt has not been able to get/ stay on [facility] for the full 16 week treatment, I had requested an extension on the original PA timeframe.

## 2021-12-24 NOTE — Telephone Encounter (Signed)
Appeal letter faxed.

## 2021-12-31 ENCOUNTER — Ambulatory Visit: Payer: 59 | Admitting: Family Medicine

## 2022-01-02 ENCOUNTER — Other Ambulatory Visit: Payer: Self-pay | Admitting: Family Medicine

## 2022-01-02 NOTE — Telephone Encounter (Signed)
Requesting: Ambien CR 12.5mg   Contract: 07/25/21 UDS: None Last Visit: 07/25/21 Next Visit: 03/18/22 Last Refill: 12/09/21 #30 and 0RF  Please Advise

## 2022-01-06 NOTE — Telephone Encounter (Signed)
No.  I can call to check on it tomorrow.

## 2022-01-08 NOTE — Telephone Encounter (Signed)
Spoke with [employer] to follow up.  They advised me it could take up to 30 days to start appeal.  They were unable to tell me if they received our appeal letter for this patient.    I will try back next week to see if a case has been open.

## 2022-01-10 ENCOUNTER — Other Ambulatory Visit: Payer: Self-pay | Admitting: Family Medicine

## 2022-01-29 ENCOUNTER — Other Ambulatory Visit: Payer: Self-pay

## 2022-01-29 ENCOUNTER — Other Ambulatory Visit: Payer: Self-pay | Admitting: Family Medicine

## 2022-01-29 MED ORDER — FAMOTIDINE 40 MG PO TABS: ORAL_TABLET | ORAL | 3 refills | Status: DC

## 2022-02-11 ENCOUNTER — Other Ambulatory Visit: Payer: Self-pay | Admitting: Family Medicine

## 2022-02-11 MED ORDER — ZOLPIDEM TARTRATE ER 12.5 MG PO TBCR: 12.5000 mg | EXTENDED_RELEASE_TABLET | Freq: Every evening | ORAL | 0 refills | Status: DC | PRN

## 2022-02-11 NOTE — Telephone Encounter (Signed)
Requesting: Ambien  Contract: 07/25/21 UDS: None Last Visit: 07/25/21 Next Visit: 03/18/22 Last Refill: 01/03/22 #30 and 0RF   Please Advise

## 2022-02-12 ENCOUNTER — Encounter: Payer: Self-pay | Admitting: Family Medicine

## 2022-02-13 ENCOUNTER — Other Ambulatory Visit: Payer: Self-pay | Admitting: Family Medicine

## 2022-02-13 MED ORDER — ZOLPIDEM TARTRATE ER 12.5 MG PO TBCR: 12.5000 mg | EXTENDED_RELEASE_TABLET | Freq: Every evening | ORAL | 0 refills | Status: DC | PRN

## 2022-02-14 ENCOUNTER — Encounter: Payer: Self-pay | Admitting: *Deleted

## 2022-03-12 ENCOUNTER — Telehealth: Payer: Self-pay

## 2022-03-12 NOTE — Telephone Encounter (Signed)
Received renewal PA for [facility], Pt was scheduled for 2/27 w/ PCP but had to be rescheduled. She will need an appt with another provider for documented weight and bmi (in person visit).

## 2022-03-13 NOTE — Telephone Encounter (Signed)
Pt sent message back states she I will talk to Dr. Charlett Blake during her annual appointment in June in reference to this. She have no interest in having the prescription refilled for [facility].  These new prescriptions were called in by Dr. Charlett Blake prior to the negative effects she had.  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 Pt ask me make note she don't plan to get [facility] refill.

## 2022-03-13 NOTE — Telephone Encounter (Signed)
Pt informed Shamaine- that she has d/c Wegovy d/t side effects.

## 2022-03-13 NOTE — Telephone Encounter (Signed)
Call pt lvm and sent mychart message  To let us know if she would like to see taylor.

## 2022-03-18 ENCOUNTER — Ambulatory Visit: Payer: 59 | Admitting: Family Medicine

## 2022-04-05 ENCOUNTER — Other Ambulatory Visit: Payer: Self-pay | Admitting: Family Medicine

## 2022-04-17 ENCOUNTER — Other Ambulatory Visit: Payer: Self-pay | Admitting: Family Medicine

## 2022-04-30 ENCOUNTER — Other Ambulatory Visit: Payer: Self-pay | Admitting: Family Medicine

## 2022-05-12 ENCOUNTER — Other Ambulatory Visit: Payer: Self-pay | Admitting: Family Medicine

## 2022-05-12 NOTE — Telephone Encounter (Signed)
Requesting: zolpidem  Contract: No UDS: will get at next visit Last Visit: 07/25/2021 Next Visit: 07/07/2022 Last Refill: 02/13/22  Please Advise

## 2022-05-19 ENCOUNTER — Ambulatory Visit: Payer: 59 | Admitting: Family Medicine

## 2022-06-02 ENCOUNTER — Other Ambulatory Visit: Payer: Self-pay | Admitting: Family Medicine

## 2022-07-05 ENCOUNTER — Other Ambulatory Visit: Payer: Self-pay | Admitting: Family Medicine

## 2022-07-07 ENCOUNTER — Encounter: Payer: 59 | Admitting: Family Medicine

## 2022-07-09 ENCOUNTER — Other Ambulatory Visit: Payer: Self-pay | Admitting: Family Medicine

## 2022-07-28 ENCOUNTER — Other Ambulatory Visit: Payer: Self-pay | Admitting: Family Medicine

## 2022-07-28 NOTE — Telephone Encounter (Signed)
Requesting: lorazepam  Contract:07/25/21 UDS: None Last Visit: 07/25/21 Next Visit: 10/07/22 Last Refill: 04/30/22 #60 and 2rf  Please Advise

## 2022-07-30 ENCOUNTER — Other Ambulatory Visit: Payer: Self-pay | Admitting: Family Medicine

## 2022-08-08 ENCOUNTER — Other Ambulatory Visit: Payer: Self-pay

## 2022-08-08 MED ORDER — FAMOTIDINE 40 MG PO TABS: ORAL_TABLET | ORAL | 3 refills | Status: DC

## 2022-08-09 ENCOUNTER — Other Ambulatory Visit: Payer: Self-pay | Admitting: Family Medicine

## 2022-08-11 NOTE — Telephone Encounter (Signed)
Requesting: Ambien Contract: 07/25/2021 UDS: N/A Last Visit:  07/26/2022 Next Visit: 08/06/2022 Last Refill: 05/12/2022  Please Advise

## 2022-08-13 ENCOUNTER — Other Ambulatory Visit: Payer: Self-pay | Admitting: Family Medicine

## 2022-08-26 ENCOUNTER — Other Ambulatory Visit: Payer: Self-pay | Admitting: Family Medicine

## 2022-08-30 ENCOUNTER — Other Ambulatory Visit: Payer: Self-pay | Admitting: Family Medicine

## 2022-09-03 ENCOUNTER — Encounter (HOSPITAL_BASED_OUTPATIENT_CLINIC_OR_DEPARTMENT_OTHER): Payer: Self-pay

## 2022-09-03 ENCOUNTER — Ambulatory Visit (HOSPITAL_BASED_OUTPATIENT_CLINIC_OR_DEPARTMENT_OTHER): Payer: 59 | Admitting: Pulmonary Disease

## 2022-09-04 ENCOUNTER — Encounter (INDEPENDENT_AMBULATORY_CARE_PROVIDER_SITE_OTHER): Payer: Self-pay

## 2022-09-30 ENCOUNTER — Telehealth: Payer: Self-pay | Admitting: Gastroenterology

## 2022-09-30 NOTE — Telephone Encounter (Signed)
Good Morning Dr Chales Abrahams  We received a call from patient requesting to scheduled a colonoscopy. Patient was previously with our practice but was seen with digestive health in 2021.   Records are available for review in epic. Please review and advise on scheduling.   Thank you

## 2022-10-01 NOTE — Telephone Encounter (Signed)
I do find EGD notes but not colonoscopy. If you do find it in Care Everywhere, please scan it under procedure/media notes Okay to schedule colonoscopy directly RG

## 2022-10-03 ENCOUNTER — Encounter: Payer: Self-pay | Admitting: Gastroenterology

## 2022-10-03 NOTE — Telephone Encounter (Signed)
Patient has been scheduled for PV on 10/17 at 9 and procedure on 11/5 at 11:30

## 2022-10-07 ENCOUNTER — Ambulatory Visit (INDEPENDENT_AMBULATORY_CARE_PROVIDER_SITE_OTHER): Payer: 59 | Admitting: Family Medicine

## 2022-10-07 ENCOUNTER — Ambulatory Visit (HOSPITAL_BASED_OUTPATIENT_CLINIC_OR_DEPARTMENT_OTHER)
Admission: RE | Admit: 2022-10-07 | Discharge: 2022-10-07 | Disposition: A | Discharge status: Home / Self Care | Payer: 59 | Source: Ambulatory Visit | Attending: Family Medicine | Admitting: Family Medicine

## 2022-10-07 ENCOUNTER — Encounter (HOSPITAL_BASED_OUTPATIENT_CLINIC_OR_DEPARTMENT_OTHER): Payer: Self-pay

## 2022-10-07 ENCOUNTER — Other Ambulatory Visit (HOSPITAL_BASED_OUTPATIENT_CLINIC_OR_DEPARTMENT_OTHER): Payer: Self-pay

## 2022-10-07 ENCOUNTER — Other Ambulatory Visit (HOSPITAL_BASED_OUTPATIENT_CLINIC_OR_DEPARTMENT_OTHER): Payer: Self-pay | Admitting: Family Medicine

## 2022-10-07 VITALS — BP 136/72 | HR 65 | Temp 98.0°F | Resp 16 | Ht 63.0 in | Wt 247.0 lb

## 2022-10-07 DIAGNOSIS — Z1231 Encounter for screening mammogram for malignant neoplasm of breast: Secondary | ICD-10-CM | POA: Diagnosis present

## 2022-10-07 DIAGNOSIS — R739 Hyperglycemia, unspecified: Secondary | ICD-10-CM

## 2022-10-07 DIAGNOSIS — Z0000 Encounter for general adult medical examination without abnormal findings: Secondary | ICD-10-CM

## 2022-10-07 DIAGNOSIS — E785 Hyperlipidemia, unspecified: Secondary | ICD-10-CM | POA: Diagnosis not present

## 2022-10-07 DIAGNOSIS — Z23 Encounter for immunization: Secondary | ICD-10-CM

## 2022-10-07 DIAGNOSIS — I10 Essential (primary) hypertension: Secondary | ICD-10-CM

## 2022-10-07 DIAGNOSIS — I700 Atherosclerosis of aorta: Secondary | ICD-10-CM

## 2022-10-07 DIAGNOSIS — E559 Vitamin D deficiency, unspecified: Secondary | ICD-10-CM | POA: Diagnosis not present

## 2022-10-07 DIAGNOSIS — Z6839 Body mass index (BMI) 39.0-39.9, adult: Secondary | ICD-10-CM

## 2022-10-07 DIAGNOSIS — N289 Disorder of kidney and ureter, unspecified: Secondary | ICD-10-CM | POA: Diagnosis not present

## 2022-10-07 MED ORDER — TIRZEPATIDE-WEIGHT MANAGEMENT 2.5 MG/0.5ML ~~LOC~~ SOAJ
2.5000 mg | SUBCUTANEOUS | 2 refills | Status: DC
  Filled 2022-10-07: qty 2, 28d supply, fill #0
  Filled 2022-11-04: qty 2, 28d supply, fill #1

## 2022-10-07 NOTE — Assessment & Plan Note (Signed)
hgba1c acceptable, minimize simple carbs. Increase exercise as tolerated.  

## 2022-10-07 NOTE — Patient Instructions (Signed)
Preventive Care 52-62 Years Old, Female  Preventive care refers to lifestyle choices and visits with your health care provider that can promote health and wellness. Preventive care visits are also called wellness exams.  What can I expect for my preventive care visit?  Counseling  Your health care provider may ask you questions about your:  Medical history, including:  Past medical problems.  Family medical history.  Pregnancy history.  Current health, including:  Menstrual cycle.  Method of birth control.  Emotional well-being.  Home life and relationship well-being.  Sexual activity and sexual health.  Lifestyle, including:  Alcohol, nicotine or tobacco, and drug use.  Access to firearms.  Diet, exercise, and sleep habits.  Work and work Astronomer.  Sunscreen use.  Safety issues such as seatbelt and bike helmet use.  Physical exam  Your health care provider will check your:  Height and weight. These may be used to calculate your BMI (body mass index). BMI is a measurement that tells if you are at a healthy weight.  Waist circumference. This measures the distance around your waistline. This measurement also tells if you are at a healthy weight and may help predict your risk of certain diseases, such as type 2 diabetes and high blood pressure.  Heart rate and blood pressure.  Body temperature.  Skin for abnormal spots.  What immunizations do I need?    Vaccines are usually given at various ages, according to a schedule. Your health care provider will recommend vaccines for you based on your age, medical history, and lifestyle or other factors, such as travel or where you work.  What tests do I need?  Screening  Your health care provider may recommend screening tests for certain conditions. This may include:  Lipid and cholesterol levels.  Diabetes screening. This is done by checking your blood sugar (glucose) after you have not eaten for a while (fasting).  Pelvic exam and Pap test.  Hepatitis B test.  Hepatitis C  test.  HIV (human immunodeficiency virus) test.  STI (sexually transmitted infection) testing, if you are at risk.  Lung cancer screening.  Colorectal cancer screening.  Mammogram. Talk with your health care provider about when you should start having regular mammograms. This may depend on whether you have a family history of breast cancer.  BRCA-related cancer screening. This may be done if you have a family history of breast, ovarian, tubal, or peritoneal cancers.  Bone density scan. This is done to screen for osteoporosis.  Talk with your health care provider about your test results, treatment options, and if necessary, the need for more tests.  Follow these instructions at home:  Eating and drinking    Eat a diet that includes fresh fruits and vegetables, whole grains, lean protein, and low-fat dairy products.  Take vitamin and mineral supplements as recommended by your health care provider.  Do not drink alcohol if:  Your health care provider tells you not to drink.  You are pregnant, may be pregnant, or are planning to become pregnant.  If you drink alcohol:  Limit how much you have to 0-1 drink a day.  Know how much alcohol is in your drink. In the [place] one drink equals one 12 oz bottle of beer (355 mL), one 5 oz glass of wine (148 mL), or one 1 oz glass of hard liquor (44 mL).  Lifestyle  Brush your teeth every morning and night with fluoride toothpaste. Floss one time each day.  Exercise for at least  30 minutes 5 or more days each week.  Do not use any products that contain nicotine or tobacco. These products include cigarettes, chewing tobacco, and vaping devices, such as e-cigarettes. If you need help quitting, ask your health care provider.  Do not use drugs.  If you are sexually active, practice safe sex. Use a condom or other form of protection to prevent STIs.  If you do not wish to become pregnant, use a form of birth control. If you plan to become pregnant, see your health care provider for a  prepregnancy visit.  Take aspirin only as told by your health care provider. Make sure that you understand how much to take and what form to take. Work with your health care provider to find out whether it is safe and beneficial for you to take aspirin daily.  Find healthy ways to manage stress, such as:  Meditation, yoga, or listening to music.  Journaling.  Talking to a trusted person.  Spending time with friends and family.  Minimize exposure to UV radiation to reduce your risk of skin cancer.  Safety  Always wear your seat belt while driving or riding in a vehicle.  Do not drive:  If you have been drinking alcohol. Do not ride with someone who has been drinking.  When you are tired or distracted.  While texting.  If you have been using any mind-altering substances or drugs.  Wear a helmet and other protective equipment during sports activities.  If you have firearms in your house, make sure you follow all gun safety procedures.  Seek help if you have been physically or sexually abused.  What's next?  Visit your health care provider once a year for an annual wellness visit.  Ask your health care provider how often you should have your eyes and teeth checked.  Stay up to date on all vaccines.  This information is not intended to replace advice given to you by your health care provider. Make sure you discuss any questions you have with your health care provider.  Document Revised: 07/04/2020 Document Reviewed: 07/04/2020  Elsevier Patient Education  2024 [employer].

## 2022-10-07 NOTE — Assessment & Plan Note (Signed)
Hydrate and monitor

## 2022-10-07 NOTE — Assessment & Plan Note (Signed)
Encourage heart healthy diet such as MIND or DASH diet, increase exercise, avoid trans fats, simple carbohydrates and processed foods, consider a krill or fish or flaxseed oil cap daily. Tolerating Atorvastatin

## 2022-10-07 NOTE — Assessment & Plan Note (Signed)
Patient encouraged to maintain heart healthy diet, regular exercise, adequate sleep. Consider daily probiotics. Take medications as prescribed

## 2022-10-07 NOTE — Assessment & Plan Note (Signed)
Supplement and monitor

## 2022-10-07 NOTE — Assessment & Plan Note (Signed)
Well controlled, no changes to meds. Encouraged heart healthy diet such as the DASH diet and exercise as tolerated.  °

## 2022-10-08 ENCOUNTER — Encounter: Payer: Self-pay | Admitting: Family Medicine

## 2022-10-08 ENCOUNTER — Other Ambulatory Visit (HOSPITAL_BASED_OUTPATIENT_CLINIC_OR_DEPARTMENT_OTHER): Payer: Self-pay

## 2022-10-08 NOTE — Progress Notes (Addendum)
Subjective:    Patient ID: Emily Davenport, female    DOB: 10-Apr-1960, 62 y.o.   MRN: [id number]  Chief Complaint  Patient presents with   Annual Exam    Annual Exam    HPI Discussed the use of AI scribe software for clinical note transcription with the patient, who gave verbal consent to proceed.  Patient is a 63 year old female who is here today for her annual physical exam and follow up on chronic medical concerns. Patient has had no recent febrile illness or acute hospitalizations. She has a PMH of HTN, hyperlipidemia, hyperglycemia and more. She tries to stay active and maintain a heart healthy diet. Her weight continues to be difficult to manage and is considering medication adjustments to help her on her journey. She has a known family history of renal disease and is concerned about her own kidney health. She is trying to hydrate well. No complaints of CP/palp/SOB/HA/congestion/fevers/GI or GU c/o. Taking meds as prescribed.    Past Medical History:  Diagnosis Date   Allergies    Anxiety    Arthritis    Benign essential HTN 09/30/2015   Bilateral swelling of feet    Borderline hypertension    Depression    Fatty liver    GERD (gastroesophageal reflux disease)    H/O sinusitis 05/11/2015   High serum parathyroid hormone (PTH) 08/18/2018   History of chicken pox 05/11/2015   History of chicken pox 05/11/2015   Hx of endometriosis 03/28/2013   Hyperlipidemia    IBS (irritable bowel syndrome)    Insomnia    Kidney problem    Knee pain    Multiple sclerosis (HCC)    PKD (polycystic kidney disease)    Renal insufficiency 12/20/2013   Seasonal allergies 05/11/2015   Tear of lateral meniscus of right knee    Vitamin D deficiency     Past Surgical History:  Procedure Laterality Date   BLADDER SURGERY  2009   sling   COLONOSCOPY     Finger Joint Surgery     KNEE ARTHROSCOPY WITH MEDIAL MENISECTOMY Right 08/15/2013   Procedure: RIGHT KNEE ARTHROSCOPY WITH DEBRIDEMENT/SHAVING  (CHRONDRPLASTY), MEDIAL AND LATERAL MENISECTOMY;  Surgeon: Nilda Simmer, MD;  Location: [facility];  Service: Orthopedics;  Laterality: Right;   NASAL SINUS SURGERY  2000   planterfaciitis  1998   right   RHINOPLASTY  1979   UPPER GI ENDOSCOPY     VAGINAL HYSTERECTOMY  2010   WISDOM TOOTH EXTRACTION      Family History  Problem Relation Age of Onset   Kidney disease Mother    Osteoporosis Mother    COPD Mother    Hypertension Mother    Hyperlipidemia Mother    Obesity Mother    Hypertension Father    Cancer Father        throat   Kidney disease Father        kidney stones and cancer   Alzheimer's disease Father    Hyperlipidemia Father    Hypertension Sister    Prostate cancer Maternal Grandfather    Alzheimer's disease Paternal Grandmother    Drug abuse Paternal Grandmother    Heart disease Paternal Grandfather    Drug abuse Paternal Grandfather     Social History   Socioeconomic History   Marital status: Married    Spouse name: Not on file   Number of children: Not on file   Years of education: Not on file  Highest education level: Not on file  Occupational History   Not on file  Tobacco Use   Smoking status: Never   Smokeless tobacco: Never  Vaping Use   Vaping status: Never Used  Substance and Sexual Activity   Alcohol use: Yes    Comment: occ   Drug use: No   Sexual activity: Not on file  Other Topics Concern   Not on file  Social History Narrative   Retired Hotel manager, lives with husband   Social Determinants of Corporate investment banker Strain: Not on file  Food Insecurity: Not on file  Transportation Needs: Not on file  Physical Activity: Not on file  Stress: Not on file  Social Connections: Not on file  Intimate Partner Violence: Not on file    Outpatient Medications Prior to Visit  Medication Sig Dispense Refill   aspirin EC 81 MG tablet Take 81 mg by mouth daily.     atorvastatin (LIPITOR) 10 MG  tablet TAKE 1 TABLET BY MOUTH DAILY 90 tablet 0   azelastine (ASTELIN) 0.1 % nasal spray azelastine 137 mcg (0.1 %) nasal spray aerosol     Cholecalciferol (VITAMIN D3) 50 MCG (2000 UT) TABS Take by mouth 1 day or 1 dose. 1 per day for 6 days     cloNIDine (CATAPRES) 0.1 MG tablet Take 1 tablet (0.1 mg total) by mouth 3 (three) times daily. 270 tablet 1   famotidine (PEPCID) 40 MG tablet TAKE 1 TABLET BY MOUTH EACH NIGHT AT BEDTIME AS NEEDED FOR HEARTBURN OR INDIGESTION 90 tablet 3   Ferrous Sulfate 140 (45 Fe) MG TBCR Take by mouth.     KRILL OIL PO Take 1 capsule by mouth daily.     LORazepam (ATIVAN) 1 MG tablet TAKE 1 TABLET BY MOUTH TWICE DAILY AS NEEDED FOR ANXIETY 60 tablet 1   metoprolol succinate (TOPROL-XL) 100 MG 24 hr tablet Take 1 tablet (100 mg total) by mouth in the morning and at bedtime. 180 tablet 0   Multiple Vitamin (MULTIVITAMIN) tablet Take 1 tablet by mouth daily.     nystatin ointment (MYCOSTATIN) APPLY TOPICALLY TWICE DAILY (Patient taking differently: as needed.) 30 g 3   ondansetron (ZOFRAN) 4 MG tablet Take 1 tablet (4 mg total) by mouth every 8 (eight) hours as needed for nausea or vomiting. 30 tablet 1   Probiotic Product (PROBIOTIC DAILY) CAPS Take by mouth.     zolpidem (AMBIEN CR) 12.5 MG CR tablet TAKE ONE TABLET BY MOUTH AT BEDTIME AS NEEDED FOR SLEEP 90 tablet 0   No facility-administered medications prior to visit.    No Known Allergies  Review of Systems  Constitutional:  Positive for malaise/fatigue. Negative for chills and fever.  HENT:  Negative for congestion and hearing loss.   Eyes:  Negative for discharge.  Respiratory:  Negative for cough, sputum production and shortness of breath.   Cardiovascular:  Negative for chest pain, palpitations and leg swelling.  Gastrointestinal:  Negative for abdominal pain, blood in stool, constipation, diarrhea, heartburn, nausea and vomiting.  Genitourinary:  Negative for dysuria, frequency, hematuria and  urgency.  Musculoskeletal:  Negative for back pain, falls and myalgias.  Skin:  Negative for rash.  Neurological:  Negative for dizziness, sensory change, loss of consciousness, weakness and headaches.  Endo/Heme/Allergies:  Negative for environmental allergies. Does not bruise/bleed easily.  Psychiatric/Behavioral:  Negative for depression and suicidal ideas. The patient is not nervous/anxious and does not have insomnia.  Objective:    Physical Exam Constitutional:      General: She is not in acute distress.    Appearance: Normal appearance. She is not diaphoretic.  HENT:     Head: Normocephalic and atraumatic.     Right Ear: Tympanic membrane, ear canal and external ear normal.     Left Ear: Tympanic membrane, ear canal and external ear normal.     Nose: Nose normal.     Mouth/Throat:     Mouth: Mucous membranes are moist.     Pharynx: Oropharynx is clear. No oropharyngeal exudate.  Eyes:     General: No scleral icterus.       Right eye: No discharge.        Left eye: No discharge.     Conjunctiva/sclera: Conjunctivae normal.     Pupils: Pupils are equal, round, and reactive to light.  Neck:     Thyroid: No thyromegaly.  Cardiovascular:     Rate and Rhythm: Normal rate and regular rhythm.     Heart sounds: Normal heart sounds. No murmur heard. Pulmonary:     Effort: Pulmonary effort is normal. No respiratory distress.     Breath sounds: Normal breath sounds. No wheezing or rales.  Abdominal:     General: Bowel sounds are normal. There is no distension.     Palpations: Abdomen is soft. There is no mass.     Tenderness: There is no abdominal tenderness.  Musculoskeletal:        General: No tenderness. Normal range of motion.     Cervical back: Normal range of motion and neck supple.  Lymphadenopathy:     Cervical: No cervical adenopathy.  Skin:    General: Skin is warm and dry.     Findings: No rash.  Neurological:     General: No focal deficit present.      Mental Status: She is alert and oriented to person, place, and time.     Cranial Nerves: No cranial nerve deficit.     Coordination: Coordination normal.     Deep Tendon Reflexes: Reflexes are normal and symmetric. Reflexes normal.  Psychiatric:        Mood and Affect: Mood normal.        Behavior: Behavior normal.        Thought Content: Thought content normal.        Judgment: Judgment normal.     BP 136/72 (BP Location: Left Arm, Patient Position: Sitting, Cuff Size: Large)   Pulse 65   Temp 98 F (36.7 C) (Oral)   Resp 16   Ht 5\' 3"  (1.6 m)   Wt 247 lb (112 kg)   SpO2 100%   BMI 43.75 kg/m  Wt Readings from Last 3 Encounters:  10/07/22 247 lb (112 kg)  07/25/21 232 lb 6.4 oz (105.4 kg)  09/18/20 214 lb 12.8 oz (97.4 kg)    Diabetic Foot Exam - Simple   No data filed    Lab Results  Component Value Date   WBC 8.1 03/05/2020   HGB 14.2 03/05/2020   HCT 42.4 03/05/2020   PLT 224.0 03/05/2020   GLUCOSE 103 (H) 03/05/2020   CHOL 144 09/18/2020   TRIG 133.0 09/18/2020   HDL 42.60 09/18/2020   LDLCALC 75 09/18/2020   ALT 22 03/05/2020   AST 16 03/05/2020   NA 138 03/05/2020   K 3.7 03/05/2020   CL 102 03/05/2020   CREATININE 0.97 03/05/2020   BUN 27 (H) 03/05/2020  CO2 27 03/05/2020   TSH 1.21 09/18/2020   HGBA1C 5.7 09/18/2020    Lab Results  Component Value Date   TSH 1.21 09/18/2020   Lab Results  Component Value Date   WBC 8.1 03/05/2020   HGB 14.2 03/05/2020   HCT 42.4 03/05/2020   MCV 85.9 03/05/2020   PLT 224.0 03/05/2020   Lab Results  Component Value Date   NA 138 03/05/2020   K 3.7 03/05/2020   CO2 27 03/05/2020   GLUCOSE 103 (H) 03/05/2020   BUN 27 (H) 03/05/2020   CREATININE 0.97 03/05/2020   BILITOT 0.4 03/05/2020   ALKPHOS 123 (H) 03/05/2020   AST 16 03/05/2020   ALT 22 03/05/2020   PROT 7.3 03/05/2020   ALBUMIN 4.3 03/05/2020   CALCIUM 9.9 03/05/2020   GFR 63.91 03/05/2020   Lab Results  Component Value Date   CHOL 144  09/18/2020   Lab Results  Component Value Date   HDL 42.60 09/18/2020   Lab Results  Component Value Date   LDLCALC 75 09/18/2020   Lab Results  Component Value Date   TRIG 133.0 09/18/2020   Lab Results  Component Value Date   CHOLHDL 3 09/18/2020   Lab Results  Component Value Date   HGBA1C 5.7 09/18/2020       Assessment & Plan:  Renal insufficiency Assessment & Plan: Hydrate and monitor   Orders: -     Ambulatory referral to Cardiology; Future  Vitamin D deficiency Assessment & Plan: Supplement and monitor   Orders: -     VITAMIN D 25 Hydroxy (Vit-D Deficiency, Fractures); Future  Hyperglycemia Assessment & Plan: hgba1c acceptable, minimize simple carbs. Increase exercise as tolerated.   Orders: -     Ambulatory referral to Cardiology; Future -     Hemoglobin A1c; Future  Hyperlipidemia, unspecified hyperlipidemia type Assessment & Plan: Encourage heart healthy diet such as MIND or DASH diet, increase exercise, avoid trans fats, simple carbohydrates and processed foods, consider a krill or fish or flaxseed oil cap daily. Tolerating Atorvastatin  Orders: -     Ambulatory referral to Cardiology; Future -     Lipid panel; Future  Hypertension, unspecified type Assessment & Plan: Well controlled, no changes to meds. Encouraged heart healthy diet such as the DASH diet and exercise as tolerated.    Orders: -     Comprehensive metabolic panel; Future -     CBC with Differential/Platelet; Future -     TSH; Future  Preventative health care Assessment & Plan: Patient encouraged to maintain heart healthy diet, regular exercise, adequate sleep. Consider daily probiotics. Take medications as prescribed. Labs ordered and reviewed Given and reviewed copy of ACP documents from [employer] and encouraged to complete and return  Colonoscopy Pap MGM Dexa   Aortic atherosclerosis (HCC) Assessment & Plan: Found on imaging, will treat medically with  statin therapy referred to cardiology for further consideration.  Orders: -     Ambulatory referral to Cardiology; Future  Need for influenza vaccination -     Flu vaccine trivalent PF, 6mos and older(Flulaval,Afluria,Fluarix,Fluzone)  Class 2 severe obesity with serious comorbidity and body mass index (BMI) of 39.0 to 39.9 in adult, unspecified obesity type [facility]) Assessment & Plan: Did not tolerate Wegovy with n/v. Will try prescriping Zepbound and will titrate up slowly   Other orders -     Tirzepatide-Weight Management; Inject 2.5 mg into the skin once a week.  Dispense: 2 mL; Refill: 2  Assessment and Plan    COVID-19 Vaccination Patient has received five COVID-19 vaccines and has had three COVID-19 infections, the most recent being last month. Discussed the nature of mucosal illnesses and the body's immune response. -No changes to current vaccination plan.  Weight Management Patient has previously tried Wegovy (GLP-1 agonist) but did not tolerate it well. Discussed the possibility of trying Zepbound or , other GLP-1 agonists with a secondary mechanism. Also discussed the importance of diet in managing side effects. -Attempt to get insurance coverage for Zepbound. If approved, start at the lowest dose and increase slowly over eight weeks. -If Zepbound is not approved but Reginal Lutes is, consider restarting Wegovy at the lowest dose. -Recommended patient consider joining [employer], a health program with a full gym, therapists, and health coaches.  Gastroesophageal Reflux Disease (GERD) Patient reports that famotidine is not providing sufficient relief. Discussed the potential benefits and risks of omeprazole (Prilosec). -Consider alternating between famotidine and omeprazole to achieve symptom relief. -Encouraged weight loss to improve GERD symptoms.  Atherosclerosis Discussed the role of weight loss and metabolic markers in managing atherosclerosis. Patient expressed interest in  seeing a cardiologist. -Order labs to check metabolic markers. -Refer patient to cardiology for consultation and potential stress test.  Chronic Kidney Disease (CKD) Risk Patient has a family history of CKD. Discussed the importance of hydration and avoiding artificial sweeteners for kidney health. -Order labs to monitor kidney function. -Consider genetic testing for potential genetic risk factors for CKD.  Vitamin D Deficiency Patient reports taking 4000 IU of vitamin D daily. Discussed the potential risks of high vitamin D levels and the benefits of maintaining low normal levels. -Order labs to check vitamin D and calcium levels. -Consider adjusting vitamin D supplementation based on lab results.  Diabetes Patient's last A1C was 6.5. Discussed the potential benefits of GLP-1 agonists for both weight loss and diabetes management. -Order labs to check A1C and monitor for proteinuria. -If A1C has increased, consider starting patient on a glucometer and potentially a GLP-1 agonist for diabetes management.  General Health Maintenance -Continue regular dental visits. -Continue regular mammograms. -Encourage patient to maintain hydration and avoid artificial sweeteners. -Encourage patient to continue with regular exercise and a healthy diet.         Danise Edge, MD

## 2022-10-08 NOTE — Assessment & Plan Note (Addendum)
Found on imaging, will treat medically with statin therapy referred to cardiology for further consideration.

## 2022-10-08 NOTE — Assessment & Plan Note (Addendum)
Did not tolerate Wegovy with n/v. Will try prescriping Zepbound and will titrate up slowly

## 2022-10-09 ENCOUNTER — Telehealth: Payer: Self-pay

## 2022-10-09 ENCOUNTER — Other Ambulatory Visit (HOSPITAL_BASED_OUTPATIENT_CLINIC_OR_DEPARTMENT_OTHER): Payer: Self-pay

## 2022-10-09 ENCOUNTER — Other Ambulatory Visit (HOSPITAL_COMMUNITY): Payer: Self-pay

## 2022-10-09 NOTE — Telephone Encounter (Signed)
Pharmacy Patient Advocate Encounter   Received notification from Physician's Office that prior authorization for Zepbound is required/requested.   Insurance verification completed.   The patient is insured through [facility] .   Per test claim: PA required; PA submitted to [facility] via CoverMyMeds Key/confirmation #/EOC Key: [id number]   Status is pending

## 2022-10-10 ENCOUNTER — Other Ambulatory Visit (HOSPITAL_BASED_OUTPATIENT_CLINIC_OR_DEPARTMENT_OTHER): Payer: Self-pay

## 2022-10-13 ENCOUNTER — Other Ambulatory Visit (HOSPITAL_COMMUNITY): Payer: Self-pay

## 2022-10-13 ENCOUNTER — Other Ambulatory Visit (HOSPITAL_BASED_OUTPATIENT_CLINIC_OR_DEPARTMENT_OTHER): Payer: Self-pay

## 2022-10-13 NOTE — Telephone Encounter (Signed)
Pharmacy Patient Advocate Encounter  Received notification from [facility] that Prior Authorization for Zepbound has been APPROVED from 9.20.24 to 3.19.25. Ran test claim, and The Rx has recently been filled and payable again on 11/03/22.    This test claim was processed through [facility]- copay amounts may vary at other pharmacies due to pharmacy/plan contracts, or as the patient moves through the different stages of their insurance plan.   PA #/Case ID/Reference #: Key: [id number]

## 2022-10-13 NOTE — Telephone Encounter (Signed)
Sent pt message letting her know Zepbound was approved

## 2022-10-17 ENCOUNTER — Other Ambulatory Visit (HOSPITAL_BASED_OUTPATIENT_CLINIC_OR_DEPARTMENT_OTHER): Payer: Self-pay

## 2022-10-21 ENCOUNTER — Other Ambulatory Visit (HOSPITAL_BASED_OUTPATIENT_CLINIC_OR_DEPARTMENT_OTHER): Payer: Self-pay

## 2022-10-22 ENCOUNTER — Other Ambulatory Visit: Payer: Self-pay | Admitting: Family Medicine

## 2022-10-23 NOTE — Telephone Encounter (Signed)
Requesting: lorazepam 1mg   Contract: 08/07/21 UDS: None Last Visit: 10/07/22 Next Visit:  02/09/23 Last Refill: 08/26/22 #60 and 1RF   Please Advise

## 2022-10-29 ENCOUNTER — Other Ambulatory Visit: Payer: Self-pay | Admitting: Family Medicine

## 2022-11-04 ENCOUNTER — Other Ambulatory Visit (HOSPITAL_BASED_OUTPATIENT_CLINIC_OR_DEPARTMENT_OTHER): Payer: Self-pay

## 2022-11-06 ENCOUNTER — Ambulatory Visit: Payer: 59

## 2022-11-06 VITALS — Ht 64.0 in | Wt 240.0 lb

## 2022-11-06 DIAGNOSIS — Z1211 Encounter for screening for malignant neoplasm of colon: Secondary | ICD-10-CM

## 2022-11-06 MED ORDER — NA SULFATE-K SULFATE-MG SULF 17.5-3.13-1.6 GM/177ML PO SOLN: 1.0000 | Freq: Once | ORAL | 0 refills | Status: AC

## 2022-11-06 NOTE — Progress Notes (Signed)
Pre visit completed via phone call; Patient verified name, DOB, and address; No egg or soy allergy known to patient;  No issues known to pt with past sedation with any surgeries or procedures; Patient denies ever being told they had issues or difficulty with intubation;  No FH of Malignant Hyperthermia; Pt is not on diet pills; Pt is not on home 02;  Pt is not on blood thinners;  Pt denies issues with constipation;  No A fib or A flutter; Have any cardiac testing pending--NO Insurance verified during PV appt--- UHC Pt can ambulate without assistance;  Pt denies use of chewing tobacco; Discussed diabetic/weight loss medication holds; Discussed NSAID holds; Checked BMI to be less than 50; Pt instructed to use Singlecare.com or GoodRx for a price reduction on prep;  Patient's chart reviewed by Cathlyn Parsons CNRA prior to previsit and patient appropriate for the [facility].  Pre visit completed and red dot placed by patient's name on their procedure day (on provider's schedule).    Instructions sent to MyChart per patient request;

## 2022-11-12 ENCOUNTER — Encounter: Payer: Self-pay | Admitting: Gastroenterology

## 2022-11-13 ENCOUNTER — Other Ambulatory Visit: Payer: Self-pay | Admitting: Family Medicine

## 2022-11-14 NOTE — Telephone Encounter (Signed)
Requesting: zolpidem (AMBIEN CR) 12.5 MG CR tablet  Contract: none UDS: none Last Visit: 10/07/2022 Next Visit: 02/09/2023 Last Refill: not written at this office  Please Advise

## 2022-11-25 ENCOUNTER — Ambulatory Visit: Payer: 59 | Admitting: Gastroenterology

## 2022-11-25 ENCOUNTER — Encounter: Payer: Self-pay | Admitting: Gastroenterology

## 2022-11-25 VITALS — BP 132/80 | HR 79 | Temp 97.5°F | Resp 15 | Ht 64.0 in | Wt 240.0 lb

## 2022-11-25 DIAGNOSIS — Z1211 Encounter for screening for malignant neoplasm of colon: Secondary | ICD-10-CM

## 2022-11-25 DIAGNOSIS — D124 Benign neoplasm of descending colon: Secondary | ICD-10-CM | POA: Diagnosis not present

## 2022-11-25 MED ORDER — SODIUM CHLORIDE 0.9 % IV SOLN: 500.0000 mL | INTRAVENOUS | Status: DC

## 2022-11-25 NOTE — Progress Notes (Signed)
Report to PACU, RN, vss, BBS= Clear.  

## 2022-11-25 NOTE — Progress Notes (Signed)
Called to room to assist during endoscopic procedure.  Patient ID and intended procedure confirmed with present staff. Received instructions for my participation in the procedure from the performing physician.  

## 2022-11-25 NOTE — Op Note (Signed)
[facility] Patient Name: Emily Davenport Procedure Date: 11/25/2022 11:09 AM MRN: [id number] Endoscopist: Lynann Bologna , MD, [id number] Age: 62 Referring MD:  Date of Birth: 1960/10/09 Gender: Female Account #: [id number] Procedure:                Colonoscopy Indications:              Screening for colorectal malignant neoplasm Medicines:                Monitored Anesthesia Care Procedure:                Pre-Anesthesia Assessment:                           - Prior to the procedure, a History and Physical                            was performed, and patient medications and                            allergies were reviewed. The patient's tolerance of                            previous anesthesia was also reviewed. The risks                            and benefits of the procedure and the sedation                            options and risks were discussed with the patient.                            All questions were answered, and informed consent                            was obtained. Prior Anticoagulants: The patient has                            taken no anticoagulant or antiplatelet agents. ASA                            Grade Assessment: II - A patient with mild systemic                            disease. After reviewing the risks and benefits,                            the patient was deemed in satisfactory condition to                            undergo the procedure.                           After obtaining informed consent, the colonoscope  was passed under direct vision. Throughout the                            procedure, the patient's blood pressure, pulse, and                            oxygen saturations were monitored continuously. The                            CF [id number] #[id number] was introduced through the anus                            and advanced to the 2 cm into the ileum. The                            colonoscopy was  performed without difficulty. The                            patient tolerated the procedure well. The quality                            of the bowel preparation was good. The terminal                            ileum, ileocecal valve, appendiceal orifice, and                            rectum were photographed. Scope In: 11:19:19 AM Scope Out: 11:27:49 AM Scope Withdrawal Time: 0 hours 6 minutes 6 seconds  Total Procedure Duration: 0 hours 8 minutes 30 seconds  Findings:                 A 6 mm polyp was found in the mid descending colon.                            The polyp was sessile. The polyp was removed with a                            cold snare. Resection and retrieval were complete.                           Multiple medium-mouthed diverticula were found in                            the sigmoid colon, transverse colon and ascending                            colon.                           Non-bleeding internal hemorrhoids were found during                            retroflexion. The hemorrhoids were small and Grade  I (internal hemorrhoids that do not prolapse).                           The terminal ileum appeared normal.                           The exam was otherwise without abnormality on                            direct and retroflexion views. Complications:            No immediate complications. Estimated Blood Loss:     Estimated blood loss: none. Impression:               - One 6 mm polyp in the mid descending colon,                            removed with a cold snare. Resected and retrieved.                           - Moderate pancolonic diverticulosis.                           - Non-bleeding internal hemorrhoids.                           - The examined portion of the ileum was normal.                           - The examination was otherwise normal on direct                            and retroflexion views. Recommendation:            - Patient has a contact number available for                            emergencies. The signs and symptoms of potential                            delayed complications were discussed with the                            patient. Return to normal activities tomorrow.                            Written discharge instructions were provided to the                            patient.                           - High fiber diet.                           - Continue present medications.                           -  Await pathology results.                           - Repeat colonoscopy for surveillance based on                            pathology results.                           - The findings and recommendations were discussed                            with the patient's family. Lynann Bologna, MD 11/25/2022 11:32:19 AM This report has been signed electronically.

## 2022-11-25 NOTE — Progress Notes (Signed)
States no changes in health since previsit.

## 2022-11-25 NOTE — Progress Notes (Signed)
[facility] History and Physical   Primary Care Physician:  Bradd Canary, MD   Reason for Procedure:   CRC screening  Plan:    colon     HPI: Emily Davenport is a 62 y.o. female    Past Medical History:  Diagnosis Date   Allergies    Anxiety    on meds   Arthritis    Benign essential HTN 09/30/2015   Bilateral swelling of feet    Borderline hypertension    Depression    Fatty liver    GERD (gastroesophageal reflux disease)    on meds   H/O sinusitis 05/11/2015   High serum parathyroid hormone (PTH) 08/18/2018   History of chicken pox 05/11/2015   History of chicken pox 05/11/2015   Hx of endometriosis 03/28/2013   Hyperlipidemia    IBS (irritable bowel syndrome)    Insomnia    Kidney problem    Knee pain    Multiple sclerosis (HCC)    PKD (polycystic kidney disease)    Renal insufficiency 12/20/2013   Seasonal allergies 05/11/2015   Skin cancer    basal and sqaumous cell - removed   Sleep apnea    does not use CPAP   Tear of lateral meniscus of right knee    Vitamin D deficiency     Past Surgical History:  Procedure Laterality Date   BLADDER SURGERY  2009   sling   BLEPHAROPLASTY Bilateral 2021   COLONOSCOPY  2014   RG-Prepopik-good prep-normal - 10 yr recall   Finger Joint Surgery     KNEE ARTHROSCOPY WITH MEDIAL MENISECTOMY Right 08/15/2013   Procedure: RIGHT KNEE ARTHROSCOPY WITH DEBRIDEMENT/SHAVING (CHRONDRPLASTY), MEDIAL AND LATERAL MENISECTOMY;  Surgeon: Nilda Simmer, MD;  Location: [facility];  Service: Orthopedics;  Laterality: Right;   NASAL SINUS SURGERY  2000   planterfaciitis  1998   right   RHINOPLASTY  1979   UPPER GI ENDOSCOPY  1985   2021   VAGINAL HYSTERECTOMY  2010   WISDOM TOOTH EXTRACTION      Prior to Admission medications   Medication Sig Start Date End Date Taking? Authorizing Provider  aspirin EC 81 MG tablet Take 81 mg by mouth daily.   Yes [provider]  atorvastatin  (LIPITOR) 10 MG tablet TAKE 1 TABLET BY MOUTH DAILY 09/01/22  Yes Bradd Canary, MD  Cholecalciferol 50 MCG (2000 UT) TABS Take 1 tablet by mouth daily at 6 (six) AM. 09/12/20  Yes [provider]  cloNIDine (CATAPRES) 0.1 MG tablet Take 0.1 mg by mouth 3 (three) times daily. 12/27/18  Yes [provider]  doxycycline (VIBRAMYCIN) 100 MG capsule Take 100 mg by mouth 2 (two) times daily. 10/22/22  Yes [provider]  famotidine (PEPCID) 40 MG tablet Take 40 mg by mouth daily as needed for heartburn or indigestion. 06/09/19  Yes [provider]  [place] Lanius (OMEGA-3) 500 MG CAPS Take 2 capsules by mouth daily. 03/30/19  Yes [provider]  LORazepam (ATIVAN) 1 MG tablet Take 1 mg by mouth 2 (two) times daily. 03/22/19  Yes [provider]  Melatonin 10 MG TBCR Take by mouth. 09/12/20  Yes [provider]  metoprolol succinate (TOPROL-XL) 100 MG 24 hr tablet Take 1 tablet (100 mg total) by mouth in the morning and at bedtime. 10/29/22  Yes Bradd Canary, MD  Multiple Vitamin (MULTIVITAMIN) tablet Take 1 tablet by mouth daily.   Yes [provider]  zolpidem (AMBIEN CR) 12.5 MG CR tablet TAKE ONE TABLET BY MOUTH DAILY AT BEDTIME AS NEEDED FOR SLEEP 11/14/22  Yes Worthy Rancher B, FNP  azelastine (ASTELIN) 0.1 % nasal spray Place 1 spray into both nostrils daily as needed.    [provider]  diphenhydrAMINE (BENADRYL) 25 MG tablet Take 25 mg by mouth every 8 (eight) hours as needed for itching or allergies. 11/25/19   [provider]  EPINEPHrine 0.3 mg/0.3 mL IJ SOAJ injection as directed Injection prn 05/28/20   [provider]  ibuprofen (ADVIL) 200 MG tablet Take 200 mg by mouth every 6 (six) hours as needed. 11/25/19   [provider]  nystatin ointment (MYCOSTATIN) APPLY TOPICALLY TWICE DAILY Patient taking differently: Apply 1 Application topically daily as needed. 02/18/21   Bradd Canary, MD   ondansetron (ZOFRAN) 4 MG tablet Take 1 tablet (4 mg total) by mouth every 8 (eight) hours as needed for nausea or vomiting. 11/01/21   Bradd Canary, MD  Probiotic Product (PROBIOTIC DAILY) CAPS Take 1 capsule by mouth daily at 6 (six) AM.    [provider]  tirzepatide (ZEPBOUND) 2.5 MG/0.5ML Pen Inject 2.5 mg into the skin once a week. 10/07/22   Bradd Canary, MD  triamcinolone (NASACORT ALLERGY 24HR) 55 MCG/ACT AERO nasal inhaler Place 1 spray into the nose daily as needed.    [provider]    Current Outpatient Medications  Medication Sig Dispense Refill   aspirin EC 81 MG tablet Take 81 mg by mouth daily.     atorvastatin (LIPITOR) 10 MG tablet TAKE 1 TABLET BY MOUTH DAILY 90 tablet 0   Cholecalciferol 50 MCG (2000 UT) TABS Take 1 tablet by mouth daily at 6 (six) AM.     cloNIDine (CATAPRES) 0.1 MG tablet Take 0.1 mg by mouth 3 (three) times daily.     doxycycline (VIBRAMYCIN) 100 MG capsule Take 100 mg by mouth 2 (two) times daily.     famotidine (PEPCID) 40 MG tablet Take 40 mg by mouth daily as needed for heartburn or indigestion.     Krill Oil (OMEGA-3) 500 MG CAPS Take 2 capsules by mouth daily.     LORazepam (ATIVAN) 1 MG tablet Take 1 mg by mouth 2 (two) times daily.     Melatonin 10 MG TBCR Take by mouth.     metoprolol succinate (TOPROL-XL) 100 MG 24 hr tablet Take 1 tablet (100 mg total) by mouth in the morning and at bedtime. 180 tablet 1   Multiple Vitamin (MULTIVITAMIN) tablet Take 1 tablet by mouth daily.     zolpidem (AMBIEN CR) 12.5 MG CR tablet TAKE ONE TABLET BY MOUTH DAILY AT BEDTIME AS NEEDED FOR SLEEP 90 tablet 0   azelastine (ASTELIN) 0.1 % nasal spray Place 1 spray into both nostrils daily as needed.     diphenhydrAMINE (BENADRYL) 25 MG tablet Take 25 mg by mouth every 8 (eight) hours as needed for itching or allergies.     EPINEPHrine 0.3 mg/0.3 mL IJ SOAJ injection as directed Injection prn     ibuprofen (ADVIL) 200 MG tablet Take 200  mg by mouth every 6 (six) hours as needed.     nystatin ointment (MYCOSTATIN) APPLY TOPICALLY TWICE DAILY (Patient taking differently: Apply 1 Application topically daily as needed.) 30 g 3   ondansetron (ZOFRAN) 4 MG tablet Take 1 tablet (4 mg total) by mouth every 8 (eight) hours as needed for nausea or vomiting. 30 tablet 1  Probiotic Product (PROBIOTIC DAILY) CAPS Take 1 capsule by mouth daily at 6 (six) AM.     tirzepatide (ZEPBOUND) 2.5 MG/0.5ML Pen Inject 2.5 mg into the skin once a week. 2 mL 2   triamcinolone (NASACORT ALLERGY 24HR) 55 MCG/ACT AERO nasal inhaler Place 1 spray into the nose daily as needed.     Current Facility-Administered Medications  Medication Dose Route Frequency Provider Last Rate Last Admin   0.9 %  sodium chloride infusion  500 mL Intravenous Continuous Lynann Bologna, MD        Allergies as of 11/25/2022   (No Known Allergies)    Family History  Problem Relation Age of Onset   Kidney disease Mother    Osteoporosis Mother    COPD Mother    Hypertension Mother    Hyperlipidemia Mother    Obesity Mother    Colon polyps Father 32   Hypertension Father    Kidney disease Father        kidney stones and cancer   Alzheimer's disease Father    Hyperlipidemia Father    Throat cancer Father 106   Kidney cancer Father 68   Hypertension Sister    Prostate cancer Maternal Grandfather    Alzheimer's disease Paternal Grandmother    Drug abuse Paternal Grandmother    Heart disease Paternal Grandfather    Drug abuse Paternal Grandfather    Colon cancer Neg Hx    Esophageal cancer Neg Hx    Stomach cancer Neg Hx    Rectal cancer Neg Hx     Social History   Socioeconomic History   Marital status: Married    Spouse name: Not on file   Number of children: Not on file   Years of education: Not on file   Highest education level: Not on file  Occupational History   Not on file  Tobacco Use   Smoking status: Never   Smokeless tobacco: Never  Vaping Use    Vaping status: Never Used  Substance and Sexual Activity   Alcohol use: Yes    Comment: occ   Drug use: No   Sexual activity: Not on file  Other Topics Concern   Not on file  Social History Narrative   Retired Hotel manager, lives with husband   Social Determinants of Corporate investment banker Strain: Not on file  Food Insecurity: Not on file  Transportation Needs: Not on file  Physical Activity: Not on file  Stress: Not on file  Social Connections: Not on file  Intimate Partner Violence: Not on file    Review of Systems: Positive for none All other review of systems negative except as mentioned in the HPI.  Physical Exam: Vital signs in last 24 hours: @VSRANGES @   General:   Alert,  Well-developed, well-nourished, pleasant and cooperative in NAD Lungs:  Clear throughout to auscultation.   Heart:  Regular rate and rhythm; no murmurs, clicks, rubs,  or gallops. Abdomen:  Soft, nontender and nondistended. Normal bowel sounds.   Neuro/Psych:  Alert and cooperative. Normal mood and affect. A and O x 3    No significant changes were identified.  The patient continues to be an appropriate candidate for the planned procedure and anesthesia.   Edman Circle, MD. [facility] 11/25/2022 11:08 AM@

## 2022-11-25 NOTE — Patient Instructions (Signed)
Thank you for letting us care for your healthcare needs today! Please see handouts regarding polyps, hemorrhoids, and High Fiber Diet.  YOU HAD AN ENDOSCOPIC PROCEDURE TODAY AT THE [facility]:   Refer to the procedure report that was given to you for any specific questions about what was found during the examination.  If the procedure report does not answer your questions, please call your gastroenterologist to clarify.  If you requested that your care partner not be given the details of your procedure findings, then the procedure report has been included in a sealed envelope for you to review at your convenience later.  YOU SHOULD EXPECT: Some feelings of bloating in the abdomen. Passage of more gas than usual.  Walking can help get rid of the air that was put into your GI tract during the procedure and reduce the bloating. If you had a lower endoscopy (such as a colonoscopy or flexible sigmoidoscopy) you may notice spotting of blood in your stool or on the toilet paper. If you underwent a bowel prep for your procedure, you may not have a normal bowel movement for a few days.  Please Note:  You might notice some irritation and congestion in your nose or some drainage.  This is from the oxygen used during your procedure.  There is no need for concern and it should clear up in a day or so.  SYMPTOMS TO REPORT IMMEDIATELY:  Following lower endoscopy (colonoscopy or flexible sigmoidoscopy):  Excessive amounts of blood in the stool  Significant tenderness or worsening of abdominal pains  Swelling of the abdomen that is new, acute  Fever of 100F or higher  For urgent or emergent issues, a gastroenterologist can be reached at any hour by calling [phone]. Do not use MyChart messaging for urgent concerns.    DIET:  We do recommend a small meal at first, but then you may proceed to your regular diet.  Drink plenty of fluids but you should avoid alcoholic beverages for 24  hours.  ACTIVITY:  You should plan to take it easy for the rest of today and you should NOT DRIVE or use heavy machinery until tomorrow (because of the sedation medicines used during the test).    FOLLOW UP: Our staff will call the number listed on your records the next business day following your procedure.  We will call around 7:15- 8:00 am to check on you and address any questions or concerns that you may have regarding the information given to you following your procedure. If we do not reach you, we will leave a message.     If any biopsies were taken you will be contacted by phone or by letter within the next 1-3 weeks.  Please call us at [phone] if you have not heard about the biopsies in 3 weeks.    SIGNATURES/CONFIDENTIALITY: You and/or your care partner have signed paperwork which will be entered into your electronic medical record.  These signatures attest to the fact that that the information above on your After Visit Summary has been reviewed and is understood.  Full responsibility of the confidentiality of this discharge information lies with you and/or your care-partner.

## 2022-11-26 ENCOUNTER — Telehealth: Payer: Self-pay

## 2022-11-26 NOTE — Telephone Encounter (Signed)
  Follow up Call-     11/25/2022   10:41 AM  Call back number  Post procedure Call Back phone  # [phone]  Permission to leave phone message Yes     Patient questions:  Do you have a fever, pain , or abdominal swelling? No. Pain Score  0 *  Have you tolerated food without any problems? Yes.    Have you been able to return to your normal activities? Yes.    Do you have any questions about your discharge instructions: Diet   No. Medications  No. Follow up visit  No.  Do you have questions or concerns about your Care? No.  Actions: * If pain score is 4 or above: No action needed, pain <4.

## 2022-11-28 LAB — SURGICAL PATHOLOGY

## 2022-11-30 ENCOUNTER — Encounter: Payer: Self-pay | Admitting: Family Medicine

## 2022-12-01 ENCOUNTER — Other Ambulatory Visit (HOSPITAL_BASED_OUTPATIENT_CLINIC_OR_DEPARTMENT_OTHER): Payer: Self-pay

## 2022-12-01 ENCOUNTER — Other Ambulatory Visit: Payer: Self-pay | Admitting: Family Medicine

## 2022-12-01 ENCOUNTER — Other Ambulatory Visit: Payer: Self-pay | Admitting: Family

## 2022-12-01 ENCOUNTER — Encounter: Payer: Self-pay | Admitting: Gastroenterology

## 2022-12-01 MED ORDER — TIRZEPATIDE-WEIGHT MANAGEMENT 5 MG/0.5ML ~~LOC~~ SOAJ
5.0000 mg | SUBCUTANEOUS | 1 refills | Status: DC
  Filled 2022-12-01: qty 2, 28d supply, fill #0

## 2022-12-09 NOTE — Telephone Encounter (Signed)
Please advise on GI sxs.

## 2022-12-10 ENCOUNTER — Other Ambulatory Visit: Payer: Self-pay | Admitting: Family Medicine

## 2022-12-10 MED ORDER — TIRZEPATIDE-WEIGHT MANAGEMENT 2.5 MG/0.5ML ~~LOC~~ SOLN
2.5000 mg | SUBCUTANEOUS | 0 refills | Status: DC
Start: 2022-12-10 — End: 2022-12-22

## 2022-12-13 ENCOUNTER — Other Ambulatory Visit: Payer: Self-pay | Admitting: Family Medicine

## 2022-12-15 ENCOUNTER — Other Ambulatory Visit (HOSPITAL_BASED_OUTPATIENT_CLINIC_OR_DEPARTMENT_OTHER): Payer: Self-pay

## 2022-12-15 MED ORDER — ZEPBOUND 2.5 MG/0.5ML ~~LOC~~ SOAJ
2.5000 mg | SUBCUTANEOUS | 0 refills | Status: DC
  Filled 2022-12-15 – 2022-12-20 (×3): qty 2, 28d supply, fill #0
  Filled ????-??-?? (×2): fill #0

## 2022-12-16 ENCOUNTER — Other Ambulatory Visit (HOSPITAL_BASED_OUTPATIENT_CLINIC_OR_DEPARTMENT_OTHER): Payer: Self-pay

## 2022-12-16 ENCOUNTER — Other Ambulatory Visit (HOSPITAL_COMMUNITY): Payer: Self-pay

## 2022-12-19 ENCOUNTER — Other Ambulatory Visit (HOSPITAL_BASED_OUTPATIENT_CLINIC_OR_DEPARTMENT_OTHER): Payer: Self-pay

## 2022-12-20 ENCOUNTER — Other Ambulatory Visit: Payer: Self-pay

## 2022-12-22 ENCOUNTER — Other Ambulatory Visit (HOSPITAL_BASED_OUTPATIENT_CLINIC_OR_DEPARTMENT_OTHER): Payer: Self-pay

## 2022-12-22 ENCOUNTER — Telehealth: Payer: Self-pay | Admitting: Emergency Medicine

## 2022-12-22 ENCOUNTER — Other Ambulatory Visit: Payer: Self-pay | Admitting: Family Medicine

## 2022-12-22 MED ORDER — ZEPBOUND 2.5 MG/0.5ML ~~LOC~~ SOAJ
2.5000 mg | SUBCUTANEOUS | 3 refills | Status: DC
  Filled 2022-12-22: qty 2, 28d supply, fill #0

## 2022-12-22 NOTE — Telephone Encounter (Signed)
Good morning,  I spoke with patient and she doesn't want to increase to the 5 mg dose. I explained what the insurance company said and she said the 5 mg makes her sick. She wants to know if you can approve her to stay on the 2.5 mg and get it approved by the insurance company

## 2022-12-22 NOTE — Telephone Encounter (Signed)
I spoke with the patient this morning and sent a telephone message in regards to what the insurance company said

## 2022-12-22 NOTE — Telephone Encounter (Signed)
Called patient back and she said [facility] doesn't stock Zepbound. I called [facility] pharmacy and spoke with Sharyl Nimrod. She said she would call patient and let her know the insurance still will not cover the 2.5 mg even if a physician note is attached to it

## 2022-12-22 NOTE — Telephone Encounter (Signed)
-----   Message from Danise Edge sent at 12/21/2022  7:30 PM EST ----- Regarding: RE: Zepbound Let patient know her insurance is requiring her to increase her Zepbound to 5mg  weekly please let her know and then if she agrees let me know so I can send it in ----- Message ----- From: Lynann Bologna, [facility] Sent: 12/19/2022   9:15 AM EST To: Bradd Canary, MD Subject: Hezzie Bump Morning Dr.Blythe,   Patient Emily Davenport filled Zepbound 2.5mg  twice (based on insurance billing info) this year, and needs to be tirated up for next dose. Please send [place] a Rx for 5mg  and that might need a PA done also.   Natale Lay [facility])

## 2022-12-23 ENCOUNTER — Other Ambulatory Visit (HOSPITAL_BASED_OUTPATIENT_CLINIC_OR_DEPARTMENT_OTHER): Payer: Self-pay

## 2022-12-23 NOTE — Telephone Encounter (Signed)
Spoke with patient and she said she is waiting to hear from the pharmacist Sharyl Nimrod) downstairs to see if the note Dr. Abner Greenspan attached will help

## 2022-12-24 ENCOUNTER — Other Ambulatory Visit (HOSPITAL_BASED_OUTPATIENT_CLINIC_OR_DEPARTMENT_OTHER): Payer: Self-pay

## 2022-12-25 ENCOUNTER — Other Ambulatory Visit (HOSPITAL_BASED_OUTPATIENT_CLINIC_OR_DEPARTMENT_OTHER): Payer: Self-pay

## 2022-12-29 ENCOUNTER — Other Ambulatory Visit (HOSPITAL_BASED_OUTPATIENT_CLINIC_OR_DEPARTMENT_OTHER): Payer: Self-pay

## 2022-12-30 ENCOUNTER — Other Ambulatory Visit: Payer: Self-pay | Admitting: Family

## 2022-12-30 ENCOUNTER — Encounter (HOSPITAL_BASED_OUTPATIENT_CLINIC_OR_DEPARTMENT_OTHER): Payer: Self-pay

## 2022-12-30 ENCOUNTER — Other Ambulatory Visit (HOSPITAL_BASED_OUTPATIENT_CLINIC_OR_DEPARTMENT_OTHER): Payer: Self-pay

## 2023-01-01 ENCOUNTER — Other Ambulatory Visit (HOSPITAL_BASED_OUTPATIENT_CLINIC_OR_DEPARTMENT_OTHER): Payer: Self-pay

## 2023-01-02 ENCOUNTER — Other Ambulatory Visit: Payer: Self-pay | Admitting: Family

## 2023-01-02 MED ORDER — ONDANSETRON HCL 4 MG PO TABS: 4.0000 mg | ORAL_TABLET | Freq: Three times a day (TID) | ORAL | 1 refills | Status: DC | PRN

## 2023-01-02 MED ORDER — TIRZEPATIDE-WEIGHT MANAGEMENT 5 MG/0.5ML ~~LOC~~ SOLN: 5.0000 mg | SUBCUTANEOUS | 1 refills | Status: DC

## 2023-01-03 ENCOUNTER — Other Ambulatory Visit: Payer: Self-pay | Admitting: Family Medicine

## 2023-01-03 ENCOUNTER — Other Ambulatory Visit: Payer: Self-pay | Admitting: Family

## 2023-01-07 ENCOUNTER — Other Ambulatory Visit: Payer: Self-pay | Admitting: Family Medicine

## 2023-01-07 ENCOUNTER — Other Ambulatory Visit (HOSPITAL_BASED_OUTPATIENT_CLINIC_OR_DEPARTMENT_OTHER): Payer: Self-pay

## 2023-01-07 ENCOUNTER — Telehealth: Payer: Self-pay | Admitting: Family Medicine

## 2023-01-07 NOTE — Telephone Encounter (Signed)
Copied from CRM [id number]. Topic: Clinical - Medication Refill >> Jan 07, 2023  4:26 PM Fuller Mandril wrote: Most Recent Primary Care Visit:  Provider: Danise Edge A  Department: [facility]  Visit Type: PHYSICAL  Date: 10/07/2022  Medication:  LORazepam (ATIVAN) 1 MG tablet - [facility]  tirzepatide (ZEPBOUND) 5 MG/0.5ML Pen - [facility]  Has the patient contacted their pharmacy? Yes - Pt states pharmacy has listed under different provider and has rejections - unable to fill. Medications are at 2 different pharmacies.  (Agent: If no, request that the patient contact the pharmacy for the refill. If patient does not wish to contact the pharmacy document the reason why and proceed with request.) (Agent: If yes, when and what did the pharmacy advise?)  Is this the correct pharmacy for this prescription? Yes If no, delete pharmacy and type the correct one.  This is the patient's preferred pharmacy:  [facility] - [place], [place] - [address] [place] [address] Phone: [phone] Fax: [phone]  [facility] - [facility] [address] [place] [address] Phone: [phone] Fax: [phone]   Has the prescription been filled recently? No  Is the patient out of the medication? Yes - out of the LORazepam (ATIVAN) 1 MG tablet for multiple days   Has the patient been seen for an appointment in the last year OR does the patient have an upcoming appointment? Yes  Can we respond through MyChart? No - please call with questions or concerns or if not able to refill   Agent: Please be advised that Rx refills may take up to 3 business days. We ask that you follow-up with your pharmacy.

## 2023-01-08 ENCOUNTER — Other Ambulatory Visit (HOSPITAL_BASED_OUTPATIENT_CLINIC_OR_DEPARTMENT_OTHER): Payer: Self-pay

## 2023-01-08 ENCOUNTER — Other Ambulatory Visit: Payer: Self-pay | Admitting: Family

## 2023-01-08 ENCOUNTER — Other Ambulatory Visit: Payer: Self-pay | Admitting: Emergency Medicine

## 2023-01-08 MED ORDER — TIRZEPATIDE-WEIGHT MANAGEMENT 5 MG/0.5ML ~~LOC~~ SOAJ
5.0000 mg | SUBCUTANEOUS | 1 refills | Status: DC
  Filled 2023-01-08: qty 2, 28d supply, fill #0
  Filled 2023-02-01: qty 2, 28d supply, fill #1

## 2023-01-08 MED ORDER — LORAZEPAM 1 MG PO TABS: 1.0000 mg | ORAL_TABLET | Freq: Two times a day (BID) | ORAL | 1 refills | Status: DC

## 2023-01-08 NOTE — Telephone Encounter (Signed)
Requesting: Lorazepam (Ativan) 1 mg tablet Contract: 08/07/2021 UDS: None Last Visit: 10/07/2022 Next Visit: 02/09/2023 Last Refill: 01/03/2023  Please Advise

## 2023-01-09 ENCOUNTER — Telehealth: Payer: Self-pay | Admitting: Emergency Medicine

## 2023-01-09 ENCOUNTER — Other Ambulatory Visit: Payer: Self-pay | Admitting: Family Medicine

## 2023-01-09 MED ORDER — LORAZEPAM 1 MG PO TABS: 1.0000 mg | ORAL_TABLET | Freq: Two times a day (BID) | ORAL | 1 refills | Status: DC

## 2023-01-09 NOTE — Telephone Encounter (Signed)
Copied from CRM [id number]. Topic: Clinical - Medication Refill >> Jan 09, 2023  9:13 AM Lorin Glass B wrote: Most Recent Primary Care Visit:  Provider: Danise Edge A  Department: [facility]  Visit Type: PHYSICAL  Date: 10/07/2022  Medication: LORazepam (ATIVAN) 1 MG tablet  Has the patient contacted their pharmacy? Yes (Agent: If no, request that the patient contact the pharmacy for the refill. If patient does not wish to contact the pharmacy document the reason why and proceed with request.) (Agent: If yes, when and what did the pharmacy advise?)  Is this the correct pharmacy for this prescription? Yes If no, delete pharmacy and type the correct one.  This is the patient's preferred pharmacy:   [facility] - [place], [place] - [address] [place] [address] Phone: [phone] Fax: [phone]   Has the prescription been filled recently? Yes  Is the patient out of the medication? Yes  Has the patient been seen for an appointment in the last year OR does the patient have an upcoming appointment? Yes  Can we respond through MyChart? Yes  Agent: Please be advised that Rx refills may take up to 3 business days. We ask that you follow-up with your pharmacy.

## 2023-01-09 NOTE — Telephone Encounter (Signed)
Sorry - I hit send before finishing. Looks like [facility] sent in - can you verify with pharmacy and advise pt please?

## 2023-01-09 NOTE — Telephone Encounter (Signed)
Patient has been made aware that revised prescription has been sent in.

## 2023-01-09 NOTE — Telephone Encounter (Signed)
Called the pharmacy and canceled the first prescription.

## 2023-01-09 NOTE — Telephone Encounter (Signed)
Emily Davenport sent in rx for 30 tabs and patient usually gets 60tabs of her lorazepam.  Can you help out with this one please and send to pt pharmacy.

## 2023-01-09 NOTE — Progress Notes (Deleted)
Referring-Emily Abner Greenspan, MD Reason for referral-aortic atherosclerosis and hyperlipidemia.  HPI: 62 year old female for evaluation of aortic atherosclerosis and hyperlipidemia at request of Danise Edge, MD.  Abdominal ultrasound March 24, 2019 showed no aneurysm.  Current Outpatient Medications  Medication Sig Dispense Refill   aspirin EC 81 MG tablet Take 81 mg by mouth daily.     atorvastatin (LIPITOR) 10 MG tablet Take 1 tablet (10 mg total) by mouth daily. 90 tablet 0   azelastine (ASTELIN) 0.1 % nasal spray Place 1 spray into both nostrils daily as needed.     Cholecalciferol 50 MCG (2000 UT) TABS Take 1 tablet by mouth daily at 6 (six) AM.     cloNIDine (CATAPRES) 0.1 MG tablet TAKE 1 TABLET BY MOUTH THREE TIMES DAILY 270 tablet 1   diphenhydrAMINE (BENADRYL) 25 MG tablet Take 25 mg by mouth every 8 (eight) hours as needed for itching or allergies.     doxycycline (VIBRAMYCIN) 100 MG capsule Take 100 mg by mouth 2 (two) times daily.     EPINEPHrine 0.3 mg/0.3 mL IJ SOAJ injection as directed Injection prn     famotidine (PEPCID) 40 MG tablet Take 40 mg by mouth daily as needed for heartburn or indigestion.     ibuprofen (ADVIL) 200 MG tablet Take 200 mg by mouth every 6 (six) hours as needed.     Krill Oil (OMEGA-3) 500 MG CAPS Take 2 capsules by mouth daily.     LORazepam (ATIVAN) 1 MG tablet Take 1 tablet (1 mg total) by mouth 2 (two) times daily. 30 tablet 1   Melatonin 10 MG TBCR Take by mouth.     metoprolol succinate (TOPROL-XL) 100 MG 24 hr tablet Take 1 tablet (100 mg total) by mouth in the morning and at bedtime. 180 tablet 1   Multiple Vitamin (MULTIVITAMIN) tablet Take 1 tablet by mouth daily.     nystatin ointment (MYCOSTATIN) APPLY TOPICALLY TWICE DAILY 30 g 3   ondansetron (ZOFRAN) 4 MG tablet Take 1 tablet (4 mg total) by mouth every 8 (eight) hours as needed for nausea or vomiting. 30 tablet 1   Probiotic Product (PROBIOTIC DAILY) CAPS Take 1 capsule by mouth  daily at 6 (six) AM.     tirzepatide (ZEPBOUND) 5 MG/0.5ML Pen Inject 5 mg into the skin once a week. 2 mL 1   triamcinolone (NASACORT ALLERGY 24HR) 55 MCG/ACT AERO nasal inhaler Place 1 spray into the nose daily as needed.     zolpidem (AMBIEN CR) 12.5 MG CR tablet TAKE ONE TABLET BY MOUTH DAILY AT BEDTIME AS NEEDED FOR SLEEP 90 tablet 0   No current facility-administered medications for this visit.    No Known Allergies   Past Medical History:  Diagnosis Date   Allergies    Anxiety    on meds   Arthritis    Benign essential HTN 09/30/2015   Bilateral swelling of feet    Borderline hypertension    Depression    Fatty liver    GERD (gastroesophageal reflux disease)    on meds   H/O sinusitis 05/11/2015   High serum parathyroid hormone (PTH) 08/18/2018   History of chicken pox 05/11/2015   History of chicken pox 05/11/2015   Hx of endometriosis 03/28/2013   Hyperlipidemia    IBS (irritable bowel syndrome)    Insomnia    Kidney problem    Knee pain    Multiple sclerosis (HCC)    PKD (polycystic kidney disease)  Renal insufficiency 12/20/2013   Seasonal allergies 05/11/2015   Skin cancer    basal and sqaumous cell - removed   Sleep apnea    does not use CPAP   Tear of lateral meniscus of right knee    Vitamin D deficiency     Past Surgical History:  Procedure Laterality Date   BLADDER SURGERY  2009   sling   BLEPHAROPLASTY Bilateral 2021   COLONOSCOPY  2014   RG-Prepopik-good prep-normal - 10 yr recall   Finger Joint Surgery     KNEE ARTHROSCOPY WITH MEDIAL MENISECTOMY Right 08/15/2013   Procedure: RIGHT KNEE ARTHROSCOPY WITH DEBRIDEMENT/SHAVING (CHRONDRPLASTY), MEDIAL AND LATERAL MENISECTOMY;  Surgeon: Nilda Simmer, MD;  Location: [facility];  Service: Orthopedics;  Laterality: Right;   NASAL SINUS SURGERY  2000   planterfaciitis  1998   right   RHINOPLASTY  1979   UPPER GI ENDOSCOPY  1985   2021   VAGINAL HYSTERECTOMY  2010   WISDOM  TOOTH EXTRACTION      Social History   Socioeconomic History   Marital status: Married    Spouse name: Not on file   Number of children: Not on file   Years of education: Not on file   Highest education level: Not on file  Occupational History   Not on file  Tobacco Use   Smoking status: Never   Smokeless tobacco: Never  Vaping Use   Vaping status: Never Used  Substance and Sexual Activity   Alcohol use: Yes    Comment: occ   Drug use: No   Sexual activity: Not on file  Other Topics Concern   Not on file  Social History Narrative   Retired Hotel manager, lives with husband   Social Drivers of Corporate investment banker Strain: Not on file  Food Insecurity: Not on file  Transportation Needs: Not on file  Physical Activity: Not on file  Stress: Not on file  Social Connections: Not on file  Intimate Partner Violence: Not on file    Family History  Problem Relation Age of Onset   Kidney disease Mother    Osteoporosis Mother    COPD Mother    Hypertension Mother    Hyperlipidemia Mother    Obesity Mother    Colon polyps Father 76   Hypertension Father    Kidney disease Father        kidney stones and cancer   Alzheimer's disease Father    Hyperlipidemia Father    Throat cancer Father 28   Kidney cancer Father 60   Hypertension Sister    Prostate cancer Maternal Grandfather    Alzheimer's disease Paternal Grandmother    Drug abuse Paternal Grandmother    Heart disease Paternal Grandfather    Drug abuse Paternal Grandfather    Colon cancer Neg Hx    Esophageal cancer Neg Hx    Stomach cancer Neg Hx    Rectal cancer Neg Hx     ROS: no fevers or chills, productive cough, hemoptysis, dysphasia, odynophagia, melena, hematochezia, dysuria, hematuria, rash, seizure activity, orthopnea, PND, pedal edema, claudication. Remaining systems are negative.  Physical Exam:   There were no vitals taken for this visit.  General:  Well developed/well  nourished in NAD Skin warm/dry Patient not depressed No peripheral clubbing Back-normal HEENT-normal/normal eyelids Neck supple/normal carotid upstroke bilaterally; no bruits; no JVD; no thyromegaly chest - CTA/ normal expansion CV - RRR/normal S1 and S2; no murmurs, rubs or gallops;  PMI nondisplaced Abdomen -NT/ND, no HSM, no mass, + bowel sounds, no bruit 2+ femoral pulses, no bruits Ext-no edema, chords, 2+ DP Neuro-grossly nonfocal  ECG - personally reviewed  A/P  1 aortic atherosclerosis-  2 hyperlipidemia-  3 hypertension-  Olga Millers, MD

## 2023-01-09 NOTE — Telephone Encounter (Signed)
Copied from CRM [id number]. Topic: Clinical - Prescription Issue >> Jan 09, 2023  9:18 AM Lorin Glass B wrote: Reason for CRM: Patient states that her medication has been getting denied and listed under a Dr. Hyman Hopes. She is now completely out of her medication which is not supposed to be stopped without tapering off slowly. States that her Lorazepam is only though Dr. Rogelia Rohrer and not sure why this issue is occurring. Submitted a refill request in the correct PCP format. Patient states it was also in 15 day supply as she takes it twice. Corrected quantity in refill request. Pharmacy has 15 day supply that is ready at pharmacy, but she does not want this. Patient would like clarification. Callback [phone]

## 2023-01-09 NOTE — Telephone Encounter (Signed)
FYI

## 2023-01-15 ENCOUNTER — Ambulatory Visit: Payer: 59 | Admitting: Cardiology

## 2023-02-09 ENCOUNTER — Telehealth: Payer: 59 | Admitting: Family Medicine

## 2023-02-10 ENCOUNTER — Other Ambulatory Visit: Payer: Self-pay | Admitting: Family

## 2023-02-11 ENCOUNTER — Other Ambulatory Visit: Payer: Self-pay | Admitting: Family Medicine

## 2023-02-11 MED ORDER — ZOLPIDEM TARTRATE ER 12.5 MG PO TBCR: 12.5000 mg | EXTENDED_RELEASE_TABLET | Freq: Every day | ORAL | 0 refills | Status: DC

## 2023-02-16 NOTE — Assessment & Plan Note (Signed)
Encouraged DASH or MIND diet, decrease po intake and increase exercise as tolerated. Needs 7-8 hours of sleep nightly. Avoid trans fats, eat small, frequent meals every 4-5 hours with lean proteins, complex carbs and healthy fats. Minimize simple carbs, high fat foods and processed foods

## 2023-02-16 NOTE — Assessment & Plan Note (Signed)
Using cpap

## 2023-02-16 NOTE — Assessment & Plan Note (Signed)
hgba1c acceptable, minimize simple carbs. Increase exercise as tolerated.

## 2023-02-16 NOTE — Assessment & Plan Note (Signed)
Hydrate and monitor

## 2023-02-16 NOTE — Assessment & Plan Note (Signed)
Encourage heart healthy diet such as MIND or DASH diet, increase exercise, avoid trans fats, simple carbohydrates and processed foods, consider a krill or fish or flaxseed oil cap daily. Tolerating Atorvastatin

## 2023-02-16 NOTE — Assessment & Plan Note (Signed)
Well controlled, no changes to meds. Encouraged heart healthy diet such as the DASH diet and exercise as tolerated.

## 2023-02-17 ENCOUNTER — Telehealth: Payer: 59 | Admitting: Family Medicine

## 2023-02-17 DIAGNOSIS — I10 Essential (primary) hypertension: Secondary | ICD-10-CM

## 2023-02-17 DIAGNOSIS — N289 Disorder of kidney and ureter, unspecified: Secondary | ICD-10-CM

## 2023-02-17 DIAGNOSIS — Z6839 Body mass index (BMI) 39.0-39.9, adult: Secondary | ICD-10-CM

## 2023-02-17 DIAGNOSIS — E785 Hyperlipidemia, unspecified: Secondary | ICD-10-CM | POA: Diagnosis not present

## 2023-02-17 DIAGNOSIS — R739 Hyperglycemia, unspecified: Secondary | ICD-10-CM | POA: Diagnosis not present

## 2023-02-17 DIAGNOSIS — G4733 Obstructive sleep apnea (adult) (pediatric): Secondary | ICD-10-CM

## 2023-02-17 DIAGNOSIS — E559 Vitamin D deficiency, unspecified: Secondary | ICD-10-CM

## 2023-02-17 DIAGNOSIS — N390 Urinary tract infection, site not specified: Secondary | ICD-10-CM

## 2023-02-17 DIAGNOSIS — E66812 Obesity, class 2: Secondary | ICD-10-CM

## 2023-02-17 MED ORDER — NITROFURANTOIN MACROCRYSTAL 50 MG PO CAPS: 50.0000 mg | ORAL_CAPSULE | Freq: Every day | ORAL | 3 refills | Status: DC | PRN

## 2023-02-17 NOTE — Patient Instructions (Signed)

## 2023-02-18 NOTE — Progress Notes (Signed)
MyChart Video Visit    Virtual Visit via Video Note   This patient is at least at moderate risk for complications without adequate follow up. This format is felt to be most appropriate for this patient at this time. Physical exam was limited by quality of the video and audio technology used for the visit. Juanetta, CMA was able to get the patient set up on a video visit.  Patient location: home Patient and provider in visit Provider location: Office  I discussed the limitations of evaluation and management by telemedicine and the availability of in person appointments. The patient expressed understanding and agreed to proceed.  Visit Date: 02/17/2023  Today's healthcare provider: Danise Edge, MD     Subjective:    Patient ID: Emily Davenport, female    DOB: 1960-10-01, 63 y.o.   MRN: [id number]  Chief Complaint  Patient presents with  . Follow-up    4 month    HPI Discussed the use of AI scribe software for clinical note transcription with the patient, who gave verbal consent to proceed.  History of Present Illness   The patient, with a history of hypertension, diabetes, and aortic atherosclerosis, presents with concerns about recurrent urinary tract infections (UTIs) and kidney stones. They report an increase in the frequency of UTIs, with recent cultures showing Klebsiella instead of the usual E. Coli. The patient is concerned about the potential impact of sex and menopause on their urinary health. They also mention having three kidney stones that have been left in place as they have not been causing any issues.  The patient is also on Zepbound for weight loss and reports some gastrointestinal side effects, including occasional nausea and a shift from diarrhea to constipation. They manage these side effects with Zofran for nausea and a stool softener for constipation. The patient is considering increasing the dose of Zepbound from 5mg  to 7.5mg  but has not made a final  decision.  The patient also mentions a history vitamin D deficiency. They report that they are no longer on high-dose vitamin D supplements as their levels have normalized.        Past Medical History:  Diagnosis Date  . Allergies   . Anxiety    on meds  . Arthritis   . Benign essential HTN 09/30/2015  . Bilateral swelling of feet   . Borderline hypertension   . Depression   . Fatty liver   . GERD (gastroesophageal reflux disease)    on meds  . H/O sinusitis 05/11/2015  . High serum parathyroid hormone (PTH) 08/18/2018  . History of chicken pox 05/11/2015  . History of chicken pox 05/11/2015  . Hx of endometriosis 03/28/2013  . Hyperlipidemia   . IBS (irritable bowel syndrome)   . Insomnia   . Kidney problem   . Knee pain   . Multiple sclerosis (HCC)   . PKD (polycystic kidney disease)   . Renal insufficiency 12/20/2013  . Seasonal allergies 05/11/2015  . Skin cancer    basal and sqaumous cell - removed  . Sleep apnea    does not use CPAP  . Tear of lateral meniscus of right knee   . Vitamin D deficiency     Past Surgical History:  Procedure Laterality Date  . BLADDER SURGERY  2009   sling  . BLEPHAROPLASTY Bilateral 2021  . COLONOSCOPY  2014   RG-Prepopik-good prep-normal - 10 yr recall  . Finger Joint Surgery    . KNEE ARTHROSCOPY WITH MEDIAL  MENISECTOMY Right 08/15/2013   Procedure: RIGHT KNEE ARTHROSCOPY WITH DEBRIDEMENT/SHAVING (CHRONDRPLASTY), MEDIAL AND LATERAL MENISECTOMY;  Surgeon: Nilda Simmer, MD;  Location: [facility];  Service: Orthopedics;  Laterality: Right;  . NASAL SINUS SURGERY  2000  . planterfaciitis  1998   right  . RHINOPLASTY  1979  . UPPER GI ENDOSCOPY  1985   2021  . VAGINAL HYSTERECTOMY  2010  . WISDOM TOOTH EXTRACTION      Family History  Problem Relation Age of Onset  . Kidney disease Mother   . Osteoporosis Mother   . COPD Mother   . Hypertension Mother   . Hyperlipidemia Mother   . Obesity Mother   .  Colon polyps Father 9  . Hypertension Father   . Kidney disease Father        kidney stones and cancer  . Alzheimer's disease Father   . Hyperlipidemia Father   . Throat cancer Father 31  . Kidney cancer Father 30  . Hypertension Sister   . Prostate cancer Maternal Grandfather   . Alzheimer's disease Paternal Grandmother   . Drug abuse Paternal Grandmother   . Heart disease Paternal Grandfather   . Drug abuse Paternal Grandfather   . Colon cancer Neg Hx   . Esophageal cancer Neg Hx   . Stomach cancer Neg Hx   . Rectal cancer Neg Hx     Social History   Socioeconomic History  . Marital status: Married    Spouse name: Not on file  . Number of children: Not on file  . Years of education: Not on file  . Highest education level: Not on file  Occupational History  . Not on file  Tobacco Use  . Smoking status: Never  . Smokeless tobacco: Never  Vaping Use  . Vaping status: Never Used  Substance and Sexual Activity  . Alcohol use: Yes    Comment: occ  . Drug use: No  . Sexual activity: Not on file  Other Topics Concern  . Not on file  Social History Narrative   Retired Hotel manager, lives with husband   Social Drivers of Corporate investment banker Strain: Not on [employer] Insecurity: Not on file  Transportation Needs: Not on file  Physical Activity: Not on file  Stress: Not on file  Social Connections: Not on file  Intimate Partner Violence: Not on file    Outpatient Medications Prior to Visit  Medication Sig Dispense Refill  . aspirin EC 81 MG tablet Take 81 mg by mouth daily.    Marland Kitchen atorvastatin (LIPITOR) 10 MG tablet Take 1 tablet (10 mg total) by mouth daily. 90 tablet 0  . azelastine (ASTELIN) 0.1 % nasal spray Place 1 spray into both nostrils daily as needed.    . Cholecalciferol 50 MCG (2000 UT) TABS Take 1 tablet by mouth daily at 6 (six) AM.    . cloNIDine (CATAPRES) 0.1 MG tablet TAKE 1 TABLET BY MOUTH THREE TIMES DAILY 270 tablet 1  .  diphenhydrAMINE (BENADRYL) 25 MG tablet Take 25 mg by mouth every 8 (eight) hours as needed for itching or allergies.    Marland Kitchen doxycycline (VIBRAMYCIN) 100 MG capsule Take 100 mg by mouth 2 (two) times daily.    Marland Kitchen EPINEPHrine 0.3 mg/0.3 mL IJ SOAJ injection as directed Injection prn    . famotidine (PEPCID) 40 MG tablet Take 40 mg by mouth daily as needed for heartburn or indigestion.    Marland Kitchen ibuprofen (ADVIL)  200 MG tablet Take 200 mg by mouth every 6 (six) hours as needed.    Boris Lown Oil (OMEGA-3) 500 MG CAPS Take 2 capsules by mouth daily.    Marland Kitchen LORazepam (ATIVAN) 1 MG tablet Take 1 tablet (1 mg total) by mouth 2 (two) times daily. 60 tablet 1  . Melatonin 10 MG TBCR Take by mouth.    . metoprolol succinate (TOPROL-XL) 100 MG 24 hr tablet Take 1 tablet (100 mg total) by mouth in the morning and at bedtime. 180 tablet 1  . Multiple Vitamin (MULTIVITAMIN) tablet Take 1 tablet by mouth daily.    Marland Kitchen nystatin ointment (MYCOSTATIN) APPLY TOPICALLY TWICE DAILY 30 g 3  . ondansetron (ZOFRAN) 4 MG tablet Take 1 tablet (4 mg total) by mouth every 8 (eight) hours as needed for nausea or vomiting. 30 tablet 1  . Probiotic Product (PROBIOTIC DAILY) CAPS Take 1 capsule by mouth daily at 6 (six) AM.    . tirzepatide (ZEPBOUND) 5 MG/0.5ML Pen Inject 5 mg into the skin once a week. 2 mL 1  . triamcinolone (NASACORT ALLERGY 24HR) 55 MCG/ACT AERO nasal inhaler Place 1 spray into the nose daily as needed.    . zolpidem (AMBIEN CR) 12.5 MG CR tablet Take 1 tablet (12.5 mg total) by mouth at bedtime. 90 tablet 0   No facility-administered medications prior to visit.    No Known Allergies  Review of Systems  Constitutional:  Negative for fever and malaise/fatigue.  HENT:  Negative for congestion.   Eyes:  Negative for blurred vision.  Respiratory:  Negative for shortness of breath.   Cardiovascular:  Negative for chest pain, palpitations and leg swelling.  Gastrointestinal:  Positive for constipation and nausea.  Negative for abdominal pain, blood in stool and vomiting.  Genitourinary:  Negative for dysuria and frequency.  Musculoskeletal:  Negative for falls.  Skin:  Negative for rash.  Neurological:  Negative for dizziness, loss of consciousness and headaches.  Endo/Heme/Allergies:  Negative for environmental allergies.  Psychiatric/Behavioral:  Negative for depression. The patient is not nervous/anxious.        Objective:    Physical Exam Constitutional:      General: She is not in acute distress.    Appearance: Normal appearance. She is not ill-appearing or toxic-appearing.  HENT:     Head: Normocephalic and atraumatic.     Right Ear: External ear normal.     Left Ear: External ear normal.     Nose: Nose normal.  Eyes:     General:        Right eye: No discharge.        Left eye: No discharge.  Pulmonary:     Effort: Pulmonary effort is normal.  Skin:    Findings: No rash.  Neurological:     Mental Status: She is alert and oriented to person, place, and time.  Psychiatric:        Behavior: Behavior normal.    There were no vitals taken for this visit. Wt Readings from Last 3 Encounters:  11/25/22 240 lb (108.9 kg)  11/06/22 240 lb (108.9 kg)  10/07/22 247 lb (112 kg)       Assessment & Plan:  Benign essential HTN Assessment & Plan: Well controlled, no changes to meds. Encouraged heart healthy diet such as the DASH diet and exercise as tolerated.    Hyperglycemia Assessment & Plan: hgba1c acceptable, minimize simple carbs. Increase exercise as tolerated.   Orders: -  Hemoglobin A1c; Future  Hyperlipidemia, unspecified hyperlipidemia type Assessment & Plan: Encourage heart healthy diet such as MIND or DASH diet, increase exercise, avoid trans fats, simple carbohydrates and processed foods, consider a krill or fish or flaxseed oil cap daily. Tolerating Atorvastatin  Orders: -     Lipid panel; Future  Hypertension, unspecified type Assessment & Plan: Well  controlled, no changes to meds. Encouraged heart healthy diet such as the DASH diet and exercise as tolerated.    Orders: -     Comprehensive metabolic panel; Future -     CBC with Differential/Platelet; Future -     TSH; Future  Renal insufficiency Assessment & Plan: Hydrate and monitor   Orders: -     Comprehensive metabolic panel; Future  OSA (obstructive sleep apnea) Assessment & Plan: Using cpap   Class 2 severe obesity with serious comorbidity and body mass index (BMI) of 39.0 to 39.9 in adult, unspecified obesity type [facility]) Assessment & Plan: Encouraged DASH or MIND diet, decrease po intake and increase exercise as tolerated. Needs 7-8 hours of sleep nightly. Avoid trans fats, eat small, frequent meals every 4-5 hours with lean proteins, complex carbs and healthy fats. Minimize simple carbs, high fat foods and processed foods    Vitamin D deficiency -     VITAMIN D 25 Hydroxy (Vit-D Deficiency, Fractures); Future  Recurrent UTI -     Urinalysis; Future -     Urine Culture; Future -     Ambulatory referral to Urogynecology  Other orders -     Nitrofurantoin Macrocrystal; Take 1 capsule (50 mg total) by mouth daily as needed.  Dispense: 30 capsule; Refill: 3     Assessment and Plan    Aortic Atherosclerosis Incidental finding on US imaging. Discussed the low risk of progression to aneurysm and the importance of lifestyle modifications and statin therapy. -Continue statin therapy.   Vitamin D Deficiency Patient reports taking 4000 international units daily. Discussed the risks of high vitamin D and benefits of maintaining low normal levels. -Continue current supplementation and monitor levels.  Recurrent Urinary Tract Infections Recent infections with Klebsiella. Discussed the potential contributing factors and prevention strategies. -Prescribe prophylactic nitrofurantoin to be taken as needed. -Advise on lifestyle modifications including hydration, cotton  undergarments, daily multistrain probiotic, and cranberry tablet. -Refer to urogynecology for further evaluation and management.  Weight Management Patient reports weight loss has improved sleep and overall health. -Continue current lifestyle modifications and monitor progress.  Medication Management Patient on Zepbound, experiencing some gastrointestinal side effects managed with Zofran. Discussed potential dose adjustment. -Continue current dose of Zepbound. -Consider dose adjustment in future if necessary, monitor closely.  Kidney Stones Asymptomatic stones noted in kidneys. Discussed potential for future issues. -Continue current management, no intervention needed at this time. -Refer to urogynecology for further evaluation and management.  Follow-up Plans -Order labs to monitor vitamin D levels and other routine checks. -Schedule virtual follow-up in 3-4 months to discuss Zepbound dosing. -Schedule in-person physical in October.         I discussed the assessment and treatment plan with the patient. The patient was provided an opportunity to ask questions and all were answered. The patient agreed with the plan and demonstrated an understanding of the instructions.   The patient was advised to call back or seek an in-person evaluation if the symptoms worsen or if the condition fails to improve as anticipated.  Danise Edge, MD [facility] [phone] (phone)  [phone] (fax)  [facility]

## 2023-02-19 ENCOUNTER — Encounter: Payer: Self-pay | Admitting: Family Medicine

## 2023-02-22 NOTE — Addendum Note (Signed)
Addended by: Danise Edge A on: 02/22/2023 04:28 PM   Modules accepted: Level of Service

## 2023-03-05 ENCOUNTER — Other Ambulatory Visit: Payer: Self-pay | Admitting: Family Medicine

## 2023-03-12 ENCOUNTER — Other Ambulatory Visit: Payer: Self-pay | Admitting: Family Medicine

## 2023-03-12 ENCOUNTER — Telehealth: Payer: Self-pay

## 2023-03-12 ENCOUNTER — Other Ambulatory Visit (HOSPITAL_COMMUNITY): Payer: Self-pay

## 2023-03-12 ENCOUNTER — Other Ambulatory Visit (HOSPITAL_BASED_OUTPATIENT_CLINIC_OR_DEPARTMENT_OTHER): Payer: Self-pay

## 2023-03-12 ENCOUNTER — Encounter: Payer: Self-pay | Admitting: Family Medicine

## 2023-03-12 MED ORDER — ZEPBOUND 7.5 MG/0.5ML ~~LOC~~ SOAJ
7.5000 mg | SUBCUTANEOUS | 0 refills | Status: DC
  Filled 2023-03-12: qty 2, 28d supply, fill #0

## 2023-03-12 NOTE — Telephone Encounter (Signed)
error 

## 2023-03-12 NOTE — Telephone Encounter (Signed)
Pharmacy Patient Advocate Encounter   Received notification from Patient Pharmacy that prior authorization for Zepbound 5mg /0.98ml is required/requested.   Insurance verification completed.   The patient is insured through [facility] .   Per test claim: PA required; PA submitted to above mentioned insurance via CoverMyMeds Key/confirmation #/EOC [id number] Status is pending

## 2023-03-13 ENCOUNTER — Other Ambulatory Visit (HOSPITAL_BASED_OUTPATIENT_CLINIC_OR_DEPARTMENT_OTHER): Payer: Self-pay

## 2023-03-13 NOTE — Telephone Encounter (Signed)
 Pharmacy Patient Advocate Encounter  Received notification from [facility] that Prior Authorization for Zepbound 5mg /0.73ml has been DENIED.  See denial reason below. No denial letter attached in CMM. Will attach denial letter to Media tab once received.   PA #/Case ID/Reference #:  [id number]

## 2023-03-16 ENCOUNTER — Other Ambulatory Visit: Payer: Self-pay | Admitting: Family Medicine

## 2023-03-17 NOTE — Telephone Encounter (Signed)
 Called and spoke with patient. Prior authorization has been handled and it was approved again.

## 2023-03-25 ENCOUNTER — Ambulatory Visit: Payer: 59 | Admitting: Cardiology

## 2023-04-19 ENCOUNTER — Other Ambulatory Visit: Payer: Self-pay | Admitting: Family Medicine

## 2023-04-30 ENCOUNTER — Other Ambulatory Visit: Payer: Self-pay | Admitting: Family

## 2023-05-05 ENCOUNTER — Other Ambulatory Visit: Payer: Self-pay | Admitting: Family Medicine

## 2023-05-05 NOTE — Telephone Encounter (Signed)
 Copied from CRM [id number]. Topic: Clinical - Prescription Issue >> May 05, 2023 11:48 AM Lovett Ruck C wrote: Reason for CRM: patient called stating she spoke with her pharmacy and they are having issues with her LORazepam (ATIVAN) 1 MG tablet. They haven't been able to get it filled by Padonda Webb NP and patient is unsure who the NP is. She was wondering if Dr. Rodrick Clapper is able to fill prescription. Pharmacy also stated per their records she doesn't have any prescriptions left but on patients bottle and per our records she has one refill left. Please advise with pharmacy and/or with patient if needed. Thank you.

## 2023-05-05 NOTE — Telephone Encounter (Signed)
 Request received, forwarded to PCP.

## 2023-05-05 NOTE — Telephone Encounter (Signed)
 Requesting: lorazepam 1mg   Contract: None UDS: None Last Visit: 02/17/23 Next Visit:  05/26/23 Last Refill: 03/06/23 #60 and 1RF   Please Advise

## 2023-05-12 ENCOUNTER — Other Ambulatory Visit: Payer: Self-pay | Admitting: Family

## 2023-05-14 ENCOUNTER — Other Ambulatory Visit: Payer: Self-pay | Admitting: Family Medicine

## 2023-05-14 NOTE — Telephone Encounter (Signed)
 Requesting: Ambien  CR 12.5mg   Contract: 08/07/21 UDS: None Last Visit: 02/17/23 Next Visit: 05/26/23 Last Refill: 02/11/23 #90 and 0RF   Please Advise

## 2023-05-14 NOTE — Telephone Encounter (Signed)
 Copied from CRM [id number]. Topic: Clinical - Prescription Issue >> May 14, 2023  1:17 PM Kita Perish H wrote: Reason for CRM: Patient is calling to check status of her refill request submitted on 4/22 for her zolpidem  (AMBIEN  CR) 12.5 MG CR tablets patient is out of medication please reach out to patient, thanks.  Aiko [phone]

## 2023-05-15 ENCOUNTER — Other Ambulatory Visit: Payer: Self-pay | Admitting: Family Medicine

## 2023-05-15 ENCOUNTER — Telehealth: Payer: Self-pay

## 2023-05-15 NOTE — Telephone Encounter (Signed)
 Copied from CRM [id number]. Topic: Clinical - Prescription Issue >> May 14, 2023  1:17 PM Kita Perish H wrote: Reason for CRM: Patient is calling to check status of her refill request submitted on 4/22 for her zolpidem  (AMBIEN  CR) 12.5 MG CR tablets patient is out of medication please reach out to patient, thanks.  [place] [phone] >> May 15, 2023 11:46 AM Alethia Huxley E wrote: Patient called back regarding this issue, stated she spoke with her pharmacy and they still do not have this medication for her and she was hoping to have it before the weekend.

## 2023-05-15 NOTE — Telephone Encounter (Signed)
 Message sent

## 2023-05-18 ENCOUNTER — Telehealth: Payer: Self-pay

## 2023-05-18 ENCOUNTER — Other Ambulatory Visit: Payer: Self-pay | Admitting: Family Medicine

## 2023-05-18 ENCOUNTER — Other Ambulatory Visit: Payer: Self-pay | Admitting: Family

## 2023-05-18 MED ORDER — ZOLPIDEM TARTRATE ER 12.5 MG PO TBCR: 12.5000 mg | EXTENDED_RELEASE_TABLET | Freq: Every day | ORAL | 0 refills | Status: DC

## 2023-05-18 MED ORDER — ZOLPIDEM TARTRATE ER 12.5 MG PO TBCR: 12.5000 mg | EXTENDED_RELEASE_TABLET | Freq: Every day | ORAL | 1 refills | Status: DC

## 2023-05-18 NOTE — Progress Notes (Deleted)
 Referring-aortic atherosclerosis and hyperlipidemia Reason for Emily Mccreedy, MD  HPI: 63 year old female for evaluation of aortic atherosclerosis and hyperlipidemia at request of Randie Bustle MD. Abdominal CT October 2020 showed aortic atherosclerosis.  Abdominal ultrasound March 2021 showed no aneurysm.  Current Outpatient Medications  Medication Sig Dispense Refill   aspirin EC 81 MG tablet Take 81 mg by mouth daily.     atorvastatin  (LIPITOR) 10 MG tablet TAKE 1 TABLET BY MOUTH DAILY 90 tablet 0   azelastine (ASTELIN) 0.1 % nasal spray Place 1 spray into both nostrils daily as needed.     Cholecalciferol 50 MCG (2000 UT) TABS Take 1 tablet by mouth daily at 6 (six) AM.     cloNIDine  (CATAPRES ) 0.1 MG tablet TAKE 1 TABLET BY MOUTH THREE TIMES DAILY 270 tablet 1   diphenhydrAMINE (BENADRYL) 25 MG tablet Take 25 mg by mouth every 8 (eight) hours as needed for itching or allergies.     doxycycline  (VIBRAMYCIN ) 100 MG capsule Take 100 mg by mouth 2 (two) times daily.     EPINEPHrine  0.3 mg/0.3 mL IJ SOAJ injection as directed Injection prn     famotidine  (PEPCID ) 40 MG tablet Take 40 mg by mouth daily as needed for heartburn or indigestion.     ibuprofen (ADVIL) 200 MG tablet Take 200 mg by mouth every 6 (six) hours as needed.     Krill Oil (OMEGA-3) 500 MG CAPS Take 2 capsules by mouth daily.     LORazepam  (ATIVAN ) 1 MG tablet TAKE 1 TABLET BY MOUTH TWICE DAILY 60 tablet 1   Melatonin 10 MG TBCR Take by mouth.     metoprolol  succinate (TOPROL -XL) 100 MG 24 hr tablet TAKE 1 TABLET (100 MG TOTAL) BY MOUTH IN THE MORNING AND AT BEDTIME. 180 tablet 1   Multiple Vitamin (MULTIVITAMIN) tablet Take 1 tablet by mouth daily.     nitrofurantoin  (MACRODANTIN ) 50 MG capsule Take 1 capsule (50 mg total) by mouth daily as needed. 30 capsule 3   nystatin  ointment (MYCOSTATIN ) APPLY TOPICALLY TWICE DAILY 30 g 3   ondansetron  (ZOFRAN ) 4 MG tablet Take 1 tablet (4 mg total) by mouth every 8  (eight) hours as needed for nausea or vomiting. 30 tablet 1   Probiotic Product (PROBIOTIC DAILY) CAPS Take 1 capsule by mouth daily at 6 (six) AM.     tirzepatide  (ZEPBOUND ) 7.5 MG/0.5ML Pen Inject 7.5 mg into the skin once a week. 2 mL 0   triamcinolone  (NASACORT  ALLERGY 24HR) 55 MCG/ACT AERO nasal inhaler Place 1 spray into the nose daily as needed.     zolpidem  (AMBIEN  CR) 12.5 MG CR tablet Take 1 tablet (12.5 mg total) by mouth at bedtime. 90 tablet 0   No current facility-administered medications for this visit.    No Known Allergies   Past Medical History:  Diagnosis Date   Allergies    Anxiety    on meds   Arthritis    Benign essential HTN 09/30/2015   Bilateral swelling of feet    Borderline hypertension    Depression    Fatty liver    GERD (gastroesophageal reflux disease)    on meds   H/O sinusitis 05/11/2015   High serum parathyroid  hormone (PTH) 08/18/2018   History of chicken pox 05/11/2015   History of chicken pox 05/11/2015   Hx of endometriosis 03/28/2013   Hyperlipidemia    IBS (irritable bowel syndrome)    Insomnia    Kidney problem  Knee pain    Multiple sclerosis (HCC)    PKD (polycystic kidney disease)    Renal insufficiency 12/20/2013   Seasonal allergies 05/11/2015   Skin cancer    basal and sqaumous cell - removed   Sleep apnea    does not use CPAP   Tear of lateral meniscus of right knee    Vitamin D  deficiency     Past Surgical History:  Procedure Laterality Date   BLADDER SURGERY  2009   sling   BLEPHAROPLASTY Bilateral 2021   COLONOSCOPY  2014   RG-Prepopik-good prep-normal - 10 yr recall   Finger Joint Surgery     KNEE ARTHROSCOPY WITH MEDIAL MENISECTOMY Right 08/15/2013   Procedure: RIGHT KNEE ARTHROSCOPY WITH DEBRIDEMENT/SHAVING (CHRONDRPLASTY), MEDIAL AND LATERAL MENISECTOMY;  Surgeon: Genevie Kerns, MD;  Location: [facility];  Service: Orthopedics;  Laterality: Right;   NASAL SINUS SURGERY  2000    planterfaciitis  1998   right   RHINOPLASTY  1979   UPPER GI ENDOSCOPY  1985   2021   VAGINAL HYSTERECTOMY  2010   WISDOM TOOTH EXTRACTION      Social History   Socioeconomic History   Marital status: Married    Spouse name: Not on file   Number of children: Not on file   Years of education: Not on file   Highest education level: Not on file  Occupational History   Not on file  Tobacco Use   Smoking status: Never   Smokeless tobacco: Never  Vaping Use   Vaping status: Never Used  Substance and Sexual Activity   Alcohol use: Yes    Comment: occ   Drug use: No   Sexual activity: Not on file  Other Topics Concern   Not on file  Social History Narrative   Retired Hotel manager, lives with husband   Social Drivers of Corporate investment banker Strain: Not on file  Food Insecurity: Not on file  Transportation Needs: Not on file  Physical Activity: Not on file  Stress: Not on file  Social Connections: Not on file  Intimate Partner Violence: Not on file    Family History  Problem Relation Age of Onset   Kidney disease Mother    Osteoporosis Mother    COPD Mother    Hypertension Mother    Hyperlipidemia Mother    Obesity Mother    Colon polyps Father 59   Hypertension Father    Kidney disease Father        kidney stones and cancer   Alzheimer's disease Father    Hyperlipidemia Father    Throat cancer Father 42   Kidney cancer Father 23   Hypertension Sister    Prostate cancer Maternal Grandfather    Alzheimer's disease Paternal Grandmother    Drug abuse Paternal Grandmother    Heart disease Paternal Grandfather    Drug abuse Paternal Grandfather    Colon cancer Neg Hx    Esophageal cancer Neg Hx    Stomach cancer Neg Hx    Rectal cancer Neg Hx     ROS: no fevers or chills, productive cough, hemoptysis, dysphasia, odynophagia, melena, hematochezia, dysuria, hematuria, rash, seizure activity, orthopnea, PND, pedal edema, claudication.  Remaining systems are negative.  Physical Exam:   There were no vitals taken for this visit.  General:  Well developed/well nourished in NAD Skin warm/dry Patient not depressed No peripheral clubbing Back-normal HEENT-normal/normal eyelids Neck supple/normal carotid upstroke bilaterally; no bruits; no JVD;  no thyromegaly chest - CTA/ normal expansion CV - RRR/normal S1 and S2; no murmurs, rubs or gallops;  PMI nondisplaced Abdomen -NT/ND, no HSM, no mass, + bowel sounds, no bruit 2+ femoral pulses, no bruits Ext-no edema, chords, 2+ DP Neuro-grossly nonfocal  ECG - personally reviewed  A/P  1 aortic atherosclerosis-  2 hyperlipidemia-  3 hypertension-  Alexandria Angel, MD

## 2023-05-18 NOTE — Telephone Encounter (Signed)
 Copied from CRM [id number]. Topic: Clinical - Prescription Issue >> May 14, 2023  1:17 PM Kita Perish H wrote: Reason for CRM: Patient is calling to check status of her refill request submitted on 4/22 for her zolpidem  (AMBIEN  CR) 12.5 MG CR tablets patient is out of medication please reach out to patient, thanks.  Josett [phone] >> May 18, 2023  3:56 PM Alyse July wrote: Patient called back stating the medication was sent to the wrong pharmacy and patient requested medication be sent to   [facility] [address] [place] - [address]  [address] Otha Blight [place] [address]  Phone: [phone] Fax: [phone]  Patient is currently out of medication. >> May 15, 2023 11:46 AM [place] E wrote: Patient called back regarding this issue, stated she spoke with her pharmacy and they still do not have this medication for her and she was hoping to have it before the weekend.

## 2023-05-19 MED ORDER — ZOLPIDEM TARTRATE ER 12.5 MG PO TBCR: 12.5000 mg | EXTENDED_RELEASE_TABLET | Freq: Every day | ORAL | 0 refills | Status: DC

## 2023-05-20 ENCOUNTER — Ambulatory Visit: Payer: 59 | Admitting: Obstetrics and Gynecology

## 2023-05-24 NOTE — Assessment & Plan Note (Deleted)
Avoid offending foods, start probiotics. Do not eat large meals in late evening and consider raising head of bed.  

## 2023-05-24 NOTE — Assessment & Plan Note (Deleted)
 Encourage heart healthy diet such as MIND or DASH diet, increase exercise, avoid trans fats, simple carbohydrates and processed foods, consider a krill or fish or flaxseed oil cap daily. Tolerating Atorvastatin

## 2023-05-24 NOTE — Assessment & Plan Note (Deleted)
 Supplement and monitor

## 2023-05-24 NOTE — Assessment & Plan Note (Deleted)
 Monitor and report any concerns, no changes to meds. Encouraged heart healthy diet such as the DASH diet and exercise as tolerated.  ?

## 2023-05-24 NOTE — Assessment & Plan Note (Deleted)
 Hydrate and monitor

## 2023-05-24 NOTE — Assessment & Plan Note (Deleted)
 [id number] acceptable, minimize simple carbs. Increase exercise as tolerated.

## 2023-05-26 ENCOUNTER — Telehealth: Payer: 59 | Admitting: Family Medicine

## 2023-05-26 DIAGNOSIS — E559 Vitamin D deficiency, unspecified: Secondary | ICD-10-CM

## 2023-05-26 DIAGNOSIS — E785 Hyperlipidemia, unspecified: Secondary | ICD-10-CM

## 2023-05-26 DIAGNOSIS — N289 Disorder of kidney and ureter, unspecified: Secondary | ICD-10-CM

## 2023-05-26 DIAGNOSIS — I10 Essential (primary) hypertension: Secondary | ICD-10-CM

## 2023-05-26 DIAGNOSIS — R739 Hyperglycemia, unspecified: Secondary | ICD-10-CM

## 2023-05-26 DIAGNOSIS — K219 Gastro-esophageal reflux disease without esophagitis: Secondary | ICD-10-CM

## 2023-05-27 ENCOUNTER — Ambulatory Visit: Payer: 59 | Admitting: Cardiology

## 2023-06-10 ENCOUNTER — Other Ambulatory Visit: Payer: Self-pay | Admitting: Family Medicine

## 2023-06-15 ENCOUNTER — Other Ambulatory Visit: Payer: Self-pay | Admitting: Family Medicine

## 2023-06-29 ENCOUNTER — Other Ambulatory Visit: Payer: Self-pay | Admitting: Family

## 2023-07-01 ENCOUNTER — Other Ambulatory Visit: Payer: Self-pay | Admitting: Family

## 2023-07-01 ENCOUNTER — Encounter: Payer: Self-pay | Admitting: Family Medicine

## 2023-07-02 ENCOUNTER — Other Ambulatory Visit: Payer: Self-pay | Admitting: Family

## 2023-07-02 ENCOUNTER — Other Ambulatory Visit: Payer: Self-pay | Admitting: Family Medicine

## 2023-07-02 MED ORDER — ONDANSETRON HCL 4 MG PO TABS: 4.0000 mg | ORAL_TABLET | Freq: Three times a day (TID) | ORAL | 3 refills | Status: DC | PRN

## 2023-07-02 MED ORDER — LORAZEPAM 1 MG PO TABS: 1.0000 mg | ORAL_TABLET | Freq: Two times a day (BID) | ORAL | 3 refills | Status: DC

## 2023-07-02 MED ORDER — NITROFURANTOIN MACROCRYSTAL 50 MG PO CAPS: 50.0000 mg | ORAL_CAPSULE | Freq: Every day | ORAL | 3 refills | Status: DC | PRN

## 2023-07-02 NOTE — Telephone Encounter (Signed)
 Requesting: lorazepam  1mg   Contract: 08/07/21 UDS: None Last Visit: 02/17/23 Next Visit: 07/21/23 Last Refill: 05/05/23 #60 and 1RF   Please Advise

## 2023-07-14 ENCOUNTER — Other Ambulatory Visit: Payer: Self-pay | Admitting: Family

## 2023-07-14 ENCOUNTER — Encounter: Payer: Self-pay | Admitting: Family Medicine

## 2023-07-14 MED ORDER — TIRZEPATIDE-WEIGHT MANAGEMENT 5 MG/0.5ML ~~LOC~~ SOAJ
5.0000 mg | SUBCUTANEOUS | 1 refills | Status: DC
  Filled 2023-07-15: qty 2, 28d supply, fill #0
  Filled 2023-08-14: qty 2, 28d supply, fill #1

## 2023-07-15 ENCOUNTER — Other Ambulatory Visit (HOSPITAL_COMMUNITY): Payer: Self-pay

## 2023-07-15 ENCOUNTER — Other Ambulatory Visit (HOSPITAL_BASED_OUTPATIENT_CLINIC_OR_DEPARTMENT_OTHER): Payer: Self-pay

## 2023-07-16 ENCOUNTER — Other Ambulatory Visit (HOSPITAL_COMMUNITY): Payer: Self-pay

## 2023-07-16 ENCOUNTER — Other Ambulatory Visit (HOSPITAL_BASED_OUTPATIENT_CLINIC_OR_DEPARTMENT_OTHER): Payer: Self-pay

## 2023-07-17 ENCOUNTER — Other Ambulatory Visit: Payer: Self-pay | Admitting: Family Medicine

## 2023-07-17 ENCOUNTER — Other Ambulatory Visit (INDEPENDENT_AMBULATORY_CARE_PROVIDER_SITE_OTHER)

## 2023-07-17 ENCOUNTER — Other Ambulatory Visit (HOSPITAL_BASED_OUTPATIENT_CLINIC_OR_DEPARTMENT_OTHER): Payer: Self-pay

## 2023-07-17 DIAGNOSIS — R739 Hyperglycemia, unspecified: Secondary | ICD-10-CM | POA: Diagnosis not present

## 2023-07-17 DIAGNOSIS — E785 Hyperlipidemia, unspecified: Secondary | ICD-10-CM | POA: Diagnosis not present

## 2023-07-17 DIAGNOSIS — E559 Vitamin D deficiency, unspecified: Secondary | ICD-10-CM

## 2023-07-17 DIAGNOSIS — I10 Essential (primary) hypertension: Secondary | ICD-10-CM

## 2023-07-17 LAB — LIPID PANEL
Cholesterol: 188 mg/dL (ref 0–200)
HDL: 55.3 mg/dL (ref >39.00)
LDL Cholesterol: 97 mg/dL (ref 0–99)
NonHDL: 133.14
Total CHOL/HDL Ratio: 3
Triglycerides: 181 mg/dL — ABNORMAL HIGH (ref 0.0–149.0)
VLDL: 36.2 mg/dL (ref 0.0–40.0)

## 2023-07-17 LAB — COMPREHENSIVE METABOLIC PANEL WITH GFR
ALT: 43 U/L — ABNORMAL HIGH (ref 0–35)
AST: 35 U/L (ref 0–37)
Albumin: 4.4 g/dL (ref 3.5–5.2)
Alkaline Phosphatase: 160 U/L — ABNORMAL HIGH (ref 39–117)
BUN: 22 mg/dL (ref 6–23)
CO2: 28 meq/L (ref 19–32)
Calcium: 10.2 mg/dL (ref 8.4–10.5)
Chloride: 103 meq/L (ref 96–112)
Creatinine, Ser: 1.06 mg/dL (ref 0.40–1.20)
GFR: 56.11 mL/min — ABNORMAL LOW (ref >60.00)
Glucose, Bld: 108 mg/dL — ABNORMAL HIGH (ref 70–99)
Potassium: 4.2 meq/L (ref 3.5–5.1)
Sodium: 137 meq/L (ref 135–145)
Total Bilirubin: 0.5 mg/dL (ref 0.2–1.2)
Total Protein: 7.4 g/dL (ref 6.0–8.3)

## 2023-07-17 LAB — CBC WITH DIFFERENTIAL/PLATELET
Basophils Absolute: 0 10*3/uL (ref 0.0–0.1)
Basophils Relative: 0.5 % (ref 0.0–3.0)
Eosinophils Absolute: 0.2 10*3/uL (ref 0.0–0.7)
Eosinophils Relative: 2.7 % (ref 0.0–5.0)
HCT: 42.4 % (ref 36.0–46.0)
Hemoglobin: 14 g/dL (ref 12.0–15.0)
Lymphocytes Relative: 38.8 % (ref 12.0–46.0)
Lymphs Abs: 2.8 10*3/uL (ref 0.7–4.0)
MCHC: 33.1 g/dL (ref 30.0–36.0)
MCV: 85.2 fl (ref 78.0–100.0)
Monocytes Absolute: 0.6 10*3/uL (ref 0.1–1.0)
Monocytes Relative: 8.1 % (ref 3.0–12.0)
Neutro Abs: 3.6 10*3/uL (ref 1.4–7.7)
Neutrophils Relative %: 49.9 % (ref 43.0–77.0)
Platelets: 254 10*3/uL (ref 150.0–400.0)
RBC: 4.98 Mil/uL (ref 3.87–5.11)
RDW: 14 % (ref 11.5–15.5)
WBC: 7.1 10*3/uL (ref 4.0–10.5)

## 2023-07-17 LAB — HEMOGLOBIN A1C: Hgb A1c MFr Bld: 6.4 % (ref 4.6–6.5)

## 2023-07-17 LAB — TSH: TSH: 1.55 u[IU]/mL (ref 0.35–5.50)

## 2023-07-17 LAB — VITAMIN D 25 HYDROXY (VIT D DEFICIENCY, FRACTURES): VITD: 26.35 ng/mL — ABNORMAL LOW (ref 30.00–100.00)

## 2023-07-19 ENCOUNTER — Ambulatory Visit: Payer: Self-pay | Admitting: Family Medicine

## 2023-07-20 ENCOUNTER — Other Ambulatory Visit (HOSPITAL_COMMUNITY): Payer: Self-pay

## 2023-07-20 ENCOUNTER — Telehealth: Payer: Self-pay

## 2023-07-20 ENCOUNTER — Other Ambulatory Visit (HOSPITAL_BASED_OUTPATIENT_CLINIC_OR_DEPARTMENT_OTHER): Payer: Self-pay

## 2023-07-20 ENCOUNTER — Other Ambulatory Visit: Payer: Self-pay | Admitting: Family Medicine

## 2023-07-20 NOTE — Telephone Encounter (Signed)
 Pharmacy Patient Advocate Encounter   Received notification from Physician's Office that prior authorization for Zepbound  5mg /0.9ml is required/requested.   Insurance verification completed.   The patient is insured through [facility] .   Per test claim: PA required; PA started via CoverMyMeds. KEY [id number] . Please see clinical question(s) below that I am not finding the answer to in their chart and advise.     Will need patient's current weight to see if there is a greater than or equal to 5% weight loss

## 2023-07-21 ENCOUNTER — Other Ambulatory Visit (HOSPITAL_BASED_OUTPATIENT_CLINIC_OR_DEPARTMENT_OTHER): Payer: Self-pay

## 2023-07-21 ENCOUNTER — Ambulatory Visit: Admitting: Family Medicine

## 2023-07-21 NOTE — Progress Notes (Unsigned)
 MyChart Video Visit    Virtual Visit via Video Note   This patient is at least at moderate risk for complications without adequate follow up. This format is felt to be most appropriate for this patient at this time. Physical exam was limited by quality of the video and audio technology used for the visit.   Patient location: home Patient and provider in visit Provider location: Office  I discussed the limitations of evaluation and management by telemedicine and the availability of in person appointments. The patient expressed understanding and agreed to proceed.  Visit Date: 07/22/2023  Today's healthcare provider: Harlene LITTIE Jolly, NP     Subjective:    Patient ID: Emily Davenport Medicine, female    DOB: Jun 17, 1960, 63 y.o.   MRN: [id number]  No chief complaint on file.   HPI  Emily Davenport is a 63 year old patient who presents for follow-up.  She is currently on tirzepatide  (Zepbound ) 5 mg injection weekly.  She was recently switched to the 5 mg down from 7.5 mg. 7.5 mg patient cannot tolerate GI side effects.  She reports that she has been steadily losing weight Diet Exercise Weight loss GI side effects    Patient denies fever, chills, SOB, CP, palpitations, dyspnea, edema, HA, vision changes, N/V/D, abdominal pain, urinary symptoms, rash, weight changes, and recent illness or hospitalizations.    History of Present Illness          Patient denies fever, chills, SOB, CP, palpitations, dyspnea, edema, HA, vision changes, N/V/D, abdominal pain, urinary symptoms, rash, weight changes, and recent illness or hospitalizations.   Past Medical History:  Diagnosis Date   Allergies    Anxiety    on meds   Arthritis    Benign essential HTN 09/30/2015   Bilateral swelling of feet    Borderline hypertension    Depression    Fatty liver    GERD (gastroesophageal reflux disease)    on meds   H/O sinusitis 05/11/2015   High serum parathyroid  hormone (PTH) 08/18/2018    History of chicken pox 05/11/2015   History of chicken pox 05/11/2015   Hx of endometriosis 03/28/2013   Hyperlipidemia    IBS (irritable bowel syndrome)    Insomnia    Kidney problem    Knee pain    Multiple sclerosis (HCC)    PKD (polycystic kidney disease)    Renal insufficiency 12/20/2013   Seasonal allergies 05/11/2015   Skin cancer    basal and sqaumous cell - removed   Sleep apnea    does not use CPAP   Tear of lateral meniscus of right knee    Vitamin D  deficiency     Past Surgical History:  Procedure Laterality Date   BLADDER SURGERY  2009   sling   BLEPHAROPLASTY Bilateral 2021   COLONOSCOPY  2014   RG-Prepopik-good prep-normal - 10 yr recall   Finger Joint Surgery     KNEE ARTHROSCOPY WITH MEDIAL MENISECTOMY Right 08/15/2013   Procedure: RIGHT KNEE ARTHROSCOPY WITH DEBRIDEMENT/SHAVING (CHRONDRPLASTY), MEDIAL AND LATERAL MENISECTOMY;  Surgeon: Lamar DELENA Millman, MD;  Location: [facility];  Service: Orthopedics;  Laterality: Right;   NASAL SINUS SURGERY  2000   planterfaciitis  1998   right   RHINOPLASTY  1979   UPPER GI ENDOSCOPY  1985   2021   VAGINAL HYSTERECTOMY  2010   WISDOM TOOTH EXTRACTION      Family History  Problem Relation Age of Onset   Kidney disease  Mother    Osteoporosis Mother    COPD Mother    Hypertension Mother    Hyperlipidemia Mother    Obesity Mother    Colon polyps Father 86   Hypertension Father    Kidney disease Father        kidney stones and cancer   Alzheimer's disease Father    Hyperlipidemia Father    Throat cancer Father 23   Kidney cancer Father 54   Hypertension Sister    Prostate cancer Maternal Grandfather    Alzheimer's disease Paternal Grandmother    Drug abuse Paternal Grandmother    Heart disease Paternal Grandfather    Drug abuse Paternal Grandfather    Colon cancer Neg Hx    Esophageal cancer Neg Hx    Stomach cancer Neg Hx    Rectal cancer Neg Hx     Social History   Socioeconomic  History   Marital status: Married    Spouse name: Not on file   Number of children: Not on file   Years of education: Not on file   Highest education level: Bachelor's degree (e.g., BA, AB, BS)  Occupational History   Not on file  Tobacco Use   Smoking status: Never   Smokeless tobacco: Never  Vaping Use   Vaping status: Never Used  Substance and Sexual Activity   Alcohol use: Yes    Comment: occ   Drug use: No   Sexual activity: Not on file  Other Topics Concern   Not on file  Social History Narrative   Retired Hotel manager, lives with husband   Social Drivers of Corporate investment banker Strain: Low Risk  (07/15/2023)   Overall Financial Resource Strain (CARDIA)    Difficulty of Paying Living Expenses: Not hard at all  Food Insecurity: No Food Insecurity (07/15/2023)   Hunger Vital Sign    Worried About Running Out of Food in the Last Year: Never true    Ran Out of Food in the Last Year: Never true  Transportation Needs: No Transportation Needs (07/15/2023)   PRAPARE - Administrator, Civil Service (Medical): No    Lack of Transportation (Non-Medical): No  Physical Activity: Insufficiently Active (07/15/2023)   Exercise Vital Sign    Days of Exercise per Week: 3 days    Minutes of Exercise per Session: 30 min  Stress: No Stress Concern Present (07/15/2023)   [employer] of Occupational Health - Occupational Stress Questionnaire    Feeling of Stress: Not at all  Social Connections: Socially Integrated (07/15/2023)   Social Connection and Isolation Panel    Frequency of Communication with Friends and Family: More than three times a week    Frequency of Social Gatherings with Friends and Family: More than three times a week    Attends Religious Services: More than 4 times per year    Active Member of [employer] or Organizations: Yes    Attends Engineer, structural: More than 4 times per year    Marital Status: Married  Careers information officer Violence: Not on file    Outpatient Medications Prior to Visit  Medication Sig Dispense Refill   aspirin EC 81 MG tablet Take 81 mg by mouth daily.     atorvastatin  (LIPITOR) 10 MG tablet Take 1 tablet (10 mg total) by mouth daily. 90 tablet 0   azelastine (ASTELIN) 0.1 % nasal spray Place 1 spray into both nostrils daily as needed.     Cholecalciferol 50  MCG (2000 UT) TABS Take 1 tablet by mouth daily at 6 (six) AM.     cloNIDine  (CATAPRES ) 0.1 MG tablet TAKE 1 TABLET BY MOUTH THREE TIMES DAILY 270 tablet 1   diphenhydrAMINE (BENADRYL) 25 MG tablet Take 25 mg by mouth every 8 (eight) hours as needed for itching or allergies.     doxycycline  (VIBRAMYCIN ) 100 MG capsule Take 100 mg by mouth 2 (two) times daily.     EPINEPHrine  0.3 mg/0.3 mL IJ SOAJ injection as directed Injection prn     famotidine  (PEPCID ) 40 MG tablet Take 40 mg by mouth daily as needed for heartburn or indigestion.     ibuprofen (ADVIL) 200 MG tablet Take 200 mg by mouth every 6 (six) hours as needed.     Krill Oil (OMEGA-3) 500 MG CAPS Take 2 capsules by mouth daily.     LORazepam  (ATIVAN ) 1 MG tablet Take 1 tablet (1 mg total) by mouth 2 (two) times daily. 60 tablet 3   Melatonin 10 MG TBCR Take by mouth.     metoprolol  succinate (TOPROL -XL) 100 MG 24 hr tablet TAKE 1 TABLET (100 MG TOTAL) BY MOUTH IN THE MORNING AND AT BEDTIME. 180 tablet 1   Multiple Vitamin (MULTIVITAMIN) tablet Take 1 tablet by mouth daily.     nitrofurantoin  (MACRODANTIN ) 50 MG capsule Take 1 capsule (50 mg total) by mouth daily as needed. 30 capsule 3   nystatin  ointment (MYCOSTATIN ) APPLY TOPICALLY TWICE DAILY 30 g 3   ondansetron  (ZOFRAN ) 4 MG tablet Take 1 tablet (4 mg total) by mouth every 8 (eight) hours as needed for nausea or vomiting. 30 tablet 3   Probiotic Product (PROBIOTIC DAILY) CAPS Take 1 capsule by mouth daily at 6 (six) AM.     tirzepatide  (ZEPBOUND ) 5 MG/0.5ML Pen Inject 5 mg into the skin once a week. 2 mL 1    triamcinolone  (NASACORT  ALLERGY 24HR) 55 MCG/ACT AERO nasal inhaler Place 1 spray into the nose daily as needed.     zolpidem  (AMBIEN  CR) 12.5 MG CR tablet Take 1 tablet (12.5 mg total) by mouth at bedtime. 90 tablet 0   zolpidem  (AMBIEN  CR) 12.5 MG CR tablet Take 1 tablet (12.5 mg total) by mouth at bedtime. 90 tablet 1   No facility-administered medications prior to visit.    No Known Allergies       Objective:    Physical Exam Constitutional:      General: She is not in acute distress.    Appearance: Normal appearance. She is not ill-appearing or toxic-appearing.  HENT:     Head: Normocephalic and atraumatic.     Right Ear: External ear normal.     Left Ear: External ear normal.     Nose: Nose normal.   Eyes:     General:        Right eye: No discharge.        Left eye: No discharge.   Pulmonary:     Effort: Pulmonary effort is normal.   Skin:    Findings: No rash.   Neurological:     Mental Status: She is alert and oriented to person, place, and time.   Psychiatric:        Behavior: Behavior normal.     There were no vitals taken for this visit. Wt Readings from Last 3 Encounters:  11/25/22 240 lb (108.9 kg)  11/06/22 240 lb (108.9 kg)  10/07/22 247 lb (112 kg)       Assessment &  Plan:  There are no diagnoses linked to this encounter.    Assessment and Plan     Follow-up in 3 months for UDS and annual physical.         I discussed the assessment and treatment plan with the patient. The patient was provided an opportunity to ask questions and all were answered. The patient agreed with the plan and demonstrated an understanding of the instructions.   The patient was advised to call back or seek an in-person evaluation if the symptoms worsen or if the condition fails to improve as anticipated.  Harlene LITTIE Jolly, NP [facility] [phone] (phone) [phone] (fax)  [facility]

## 2023-07-22 ENCOUNTER — Encounter: Payer: Self-pay | Admitting: Student

## 2023-07-22 ENCOUNTER — Telehealth: Admitting: Student

## 2023-07-22 ENCOUNTER — Other Ambulatory Visit (HOSPITAL_COMMUNITY): Payer: Self-pay

## 2023-07-22 ENCOUNTER — Other Ambulatory Visit (HOSPITAL_BASED_OUTPATIENT_CLINIC_OR_DEPARTMENT_OTHER): Payer: Self-pay

## 2023-07-22 VITALS — Wt 211.0 lb

## 2023-07-22 DIAGNOSIS — Z6836 Body mass index (BMI) 36.0-36.9, adult: Secondary | ICD-10-CM

## 2023-07-22 DIAGNOSIS — E66812 Obesity, class 2: Secondary | ICD-10-CM

## 2023-07-22 DIAGNOSIS — E785 Hyperlipidemia, unspecified: Secondary | ICD-10-CM

## 2023-07-22 DIAGNOSIS — R110 Nausea: Secondary | ICD-10-CM | POA: Insufficient documentation

## 2023-07-22 NOTE — Assessment & Plan Note (Addendum)
 Nausea related to GLP-1 SEs.  Zepbound  changed from 7.5 mg to 5 mg recently, this may minimize GI Ses. Can increase Zofran  to 8 mg every 8 hours as needed.  If nausea persists does not improve return to clinic.  Ensure adequate hydration.

## 2023-07-22 NOTE — Assessment & Plan Note (Signed)
 Stable.  Tolerating atorvastatin .  Encourage heart healthy diet such as MIND or DASH diet, increase exercise, avoid trans fats, simple carbohydrates and processed foods, consider a krill or fish or flaxseed oil cap daily.

## 2023-07-22 NOTE — Telephone Encounter (Signed)
 Clinical questions answered and PA submitted.

## 2023-07-22 NOTE — Assessment & Plan Note (Signed)
 Encouraged DASH or MIND diet, decrease po intake and increase exercise as tolerated. Needs 7-8 hours of sleep nightly. Avoid trans fats, eat small, frequent meals every 4-5 hours with lean proteins, complex carbs and healthy fats. Minimize simple carbs, high fat foods and processed foods

## 2023-07-22 NOTE — Telephone Encounter (Signed)
 Pt has lost 5% of baseline.  She is now 89.  Her baseline was 247.

## 2023-07-23 ENCOUNTER — Other Ambulatory Visit (HOSPITAL_COMMUNITY): Payer: Self-pay

## 2023-07-23 NOTE — Telephone Encounter (Signed)
 Pharmacy Patient Advocate Encounter  Received notification from [employer] that Prior Authorization for Zepbound  5mg /0.8ml has been APPROVED from 07/22/23 to 07/21/24   PA #/Case ID/Reference #: [id number]

## 2023-08-21 ENCOUNTER — Encounter: Payer: Self-pay | Admitting: Cardiology

## 2023-08-21 NOTE — Progress Notes (Signed)
 Referring-Emily Domenica, MD Reason for referral-hyperlipidemia and aortic atherosclerosis  HPI: 63 year old female for evaluation of hyperlipidemia and aortic atherosclerosis at request of Harlene Domenica, MD.  Abdominal ultrasound March 2021 showed no aneurysm.  Patient has some dyspnea with more vigorous activities.  No orthopnea, PND, pedal edema, chest pain or syncope.  Current Outpatient Medications  Medication Sig Dispense Refill   aspirin EC 81 MG tablet Take 81 mg by mouth daily.     atorvastatin  (LIPITOR) 10 MG tablet Take 1 tablet (10 mg total) by mouth daily. 90 tablet 0   Cholecalciferol 50 MCG (2000 UT) TABS Take 1 tablet by mouth daily at 6 (six) AM.     cloNIDine  (CATAPRES ) 0.1 MG tablet TAKE 1 TABLET BY MOUTH THREE TIMES DAILY 270 tablet 1   diphenhydrAMINE (BENADRYL) 25 MG tablet Take 25 mg by mouth every 8 (eight) hours as needed for itching or allergies.     EPINEPHrine  0.3 mg/0.3 mL IJ SOAJ injection as directed Injection prn     famotidine  (PEPCID ) 40 MG tablet Take 40 mg by mouth daily as needed for heartburn or indigestion.     ibuprofen (ADVIL) 200 MG tablet Take 200 mg by mouth every 6 (six) hours as needed.     Krill Oil (OMEGA-3) 500 MG CAPS Take 2 capsules by mouth daily.     LORazepam  (ATIVAN ) 1 MG tablet Take 1 tablet (1 mg total) by mouth 2 (two) times daily. 60 tablet 3   metoprolol  succinate (TOPROL -XL) 100 MG 24 hr tablet TAKE 1 TABLET (100 MG TOTAL) BY MOUTH IN THE MORNING AND AT BEDTIME. 180 tablet 1   Multiple Vitamin (MULTIVITAMIN) tablet Take 1 tablet by mouth daily.     nitrofurantoin  (MACRODANTIN ) 50 MG capsule Take 1 capsule (50 mg total) by mouth daily as needed. 30 capsule 3   nystatin  ointment (MYCOSTATIN ) APPLY TOPICALLY TWICE DAILY 30 g 3   ondansetron  (ZOFRAN ) 4 MG tablet Take 1 tablet (4 mg total) by mouth every 8 (eight) hours as needed for nausea or vomiting. 30 tablet 3   Probiotic Product (PROBIOTIC DAILY) CAPS Take 1 capsule by mouth  daily at 6 (six) AM.     tirzepatide  (ZEPBOUND ) 5 MG/0.5ML Pen Inject 5 mg into the skin once a week. 2 mL 1   triamcinolone  (NASACORT  ALLERGY 24HR) 55 MCG/ACT AERO nasal inhaler Place 1 spray into the nose daily as needed.     zolpidem  (AMBIEN  CR) 12.5 MG CR tablet Take 1 tablet (12.5 mg total) by mouth at bedtime. 90 tablet 1   No current facility-administered medications for this visit.    No Known Allergies   Past Medical History:  Diagnosis Date   Allergies    Anxiety    on meds   Arthritis    Benign essential HTN 09/30/2015   Bilateral swelling of feet    Borderline hypertension    Depression    Fatty liver    GERD (gastroesophageal reflux disease)    on meds   H/O sinusitis 05/11/2015   High serum parathyroid  hormone (PTH) 08/18/2018   History of chicken pox 05/11/2015   History of chicken pox 05/11/2015   Hx of endometriosis 03/28/2013   Hyperlipidemia    IBS (irritable bowel syndrome)    Insomnia    Kidney problem    Knee pain    Multiple sclerosis (HCC)    PKD (polycystic kidney disease)    Renal insufficiency 12/20/2013   Seasonal allergies 05/11/2015  Skin cancer    basal and sqaumous cell - removed   Sleep apnea    does not use CPAP   Tear of lateral meniscus of right knee    Vitamin D  deficiency     Past Surgical History:  Procedure Laterality Date   BLADDER SURGERY  2009   sling   BLEPHAROPLASTY Bilateral 2021   COLONOSCOPY  2014   RG-Prepopik-good prep-normal - 10 yr recall   Finger Joint Surgery     KNEE ARTHROSCOPY WITH MEDIAL MENISECTOMY Right 08/15/2013   Procedure: RIGHT KNEE ARTHROSCOPY WITH DEBRIDEMENT/SHAVING (CHRONDRPLASTY), MEDIAL AND LATERAL MENISECTOMY;  Surgeon: Lamar DELENA Millman, MD;  Location: [facility];  Service: Orthopedics;  Laterality: Right;   NASAL SINUS SURGERY  2000   planterfaciitis  1998   right   RHINOPLASTY  1979   UPPER GI ENDOSCOPY  1985   2021   VAGINAL HYSTERECTOMY  2010   WISDOM TOOTH  EXTRACTION      Social History   Socioeconomic History   Marital status: Married    Spouse name: Not on file   Number of children: Not on file   Years of education: Not on file   Highest education level: Bachelor's degree (e.g., BA, AB, BS)  Occupational History   Not on file  Tobacco Use   Smoking status: Never   Smokeless tobacco: Never  Vaping Use   Vaping status: Never Used  Substance and Sexual Activity   Alcohol use: Yes    Comment: occ   Drug use: No   Sexual activity: Not on file  Other Topics Concern   Not on file  Social History Narrative   Retired Hotel manager, lives with husband   Social Drivers of Corporate investment banker Strain: Low Risk  (07/15/2023)   Overall Financial Resource Strain (CARDIA)    Difficulty of Paying Living Expenses: Not hard at all  Food Insecurity: No Food Insecurity (07/15/2023)   Hunger Vital Sign    Worried About Running Out of Food in the Last Year: Never true    Ran Out of Food in the Last Year: Never true  Transportation Needs: No Transportation Needs (07/15/2023)   PRAPARE - Administrator, Civil Service (Medical): No    Lack of Transportation (Non-Medical): No  Physical Activity: Insufficiently Active (07/15/2023)   Exercise Vital Sign    Days of Exercise per Week: 3 days    Minutes of Exercise per Session: 30 min  Stress: No Stress Concern Present (07/15/2023)   [employer] of Occupational Health - Occupational Stress Questionnaire    Feeling of Stress: Not at all  Social Connections: Socially Integrated (07/15/2023)   Social Connection and Isolation Panel    Frequency of Communication with Friends and Family: More than three times a week    Frequency of Social Gatherings with Friends and Family: More than three times a week    Attends Religious Services: More than 4 times per year    Active Member of [employer] or Organizations: Yes    Attends Engineer, structural: More than 4 times  per year    Marital Status: Married  Catering manager Violence: Not on file    Family History  Problem Relation Age of Onset   Kidney disease Mother    Osteoporosis Mother    COPD Mother    Hypertension Mother    Hyperlipidemia Mother    Obesity Mother    Colon polyps Father 14  Hypertension Father    Kidney disease Father        kidney stones and cancer   Alzheimer's disease Father    Hyperlipidemia Father    Throat cancer Father 18   Kidney cancer Father 27   Hypertension Sister    Prostate cancer Maternal Grandfather    Alzheimer's disease Paternal Grandmother    Drug abuse Paternal Grandmother    Heart disease Paternal Grandfather    Drug abuse Paternal Grandfather    Colon cancer Neg Hx    Esophageal cancer Neg Hx    Stomach cancer Neg Hx    Rectal cancer Neg Hx     ROS: Arthralgias but no fevers or chills, productive cough, hemoptysis, dysphasia, odynophagia, melena, hematochezia, dysuria, hematuria, rash, seizure activity, orthopnea, PND, pedal edema, claudication. Remaining systems are negative.  Physical Exam:   There were no vitals taken for this visit.  General:  Well developed/well nourished in NAD Skin warm/dry Patient not depressed No peripheral clubbing Back-normal HEENT-normal/normal eyelids Neck supple/normal carotid upstroke bilaterally; no bruits; no JVD; no thyromegaly chest - CTA/ normal expansion CV - RRR/normal S1 and S2; no murmurs, rubs or gallops;  PMI nondisplaced Abdomen -NT/ND, no HSM, no mass, + bowel sounds, no bruit 2+ femoral pulses, no bruits Ext-no edema, chords, 2+ DP Neuro-grossly nonfocal  EKG Interpretation Date/Time:  Wednesday September 02 2023 09:50:21 EDT Ventricular Rate:  79 PR Interval:  148 QRS Duration:  90 QT Interval:  352 QTC Calculation: 403 R Axis:   -27  Text Interpretation: Normal sinus rhythm Minimal voltage criteria for LVH, may be normal variant ( R in aVL ) Possible Anterolateral infarct , age  undetermined Non-specific ST-t changes Confirmed by Pietro Rogue ([id number]) on 09/02/2023 10:03:43 AM    A/P  1 aortic atherosclerosis-plan to continue statin.  Will also arrange calcium  score for risk stratification.  2 hyperlipidemia-patient does have fatty liver.  Will continue low-dose Lipitor and add Zetia  10 mg daily.  Check lipids, liver and LP(a) in 8 weeks.  3 morbid obesity-she has lost weight recently and will continue Zepbound .  She states  4 dyspnea on exertion-Will arrange echocardiogram to assess LV function.    Rogue Pietro, MD

## 2023-08-28 ENCOUNTER — Other Ambulatory Visit: Payer: Self-pay | Admitting: Family Medicine

## 2023-09-02 ENCOUNTER — Ambulatory Visit: Attending: Cardiology | Admitting: Cardiology

## 2023-09-02 ENCOUNTER — Encounter: Payer: Self-pay | Admitting: Cardiology

## 2023-09-02 VITALS — BP 122/76 | HR 79 | Ht 64.0 in | Wt 218.0 lb

## 2023-09-02 DIAGNOSIS — E785 Hyperlipidemia, unspecified: Secondary | ICD-10-CM | POA: Diagnosis not present

## 2023-09-02 DIAGNOSIS — I700 Atherosclerosis of aorta: Secondary | ICD-10-CM

## 2023-09-02 DIAGNOSIS — R0602 Shortness of breath: Secondary | ICD-10-CM

## 2023-09-02 DIAGNOSIS — I10 Essential (primary) hypertension: Secondary | ICD-10-CM | POA: Diagnosis not present

## 2023-09-02 MED ORDER — EZETIMIBE 10 MG PO TABS: 10.0000 mg | ORAL_TABLET | Freq: Every day | ORAL | 3 refills | Status: AC

## 2023-09-02 NOTE — Patient Instructions (Signed)
 Medication Instructions:   START EZETIMIBE  10 MG ONCE DAILY  *If you need a refill on your cardiac medications before your next appointment, please call your pharmacy*  Lab Work:  Your physician recommends that you return for lab work in:[facility]  If you have labs (blood work) drawn today and your tests are completely normal, you will receive your results only by: MyChart Message (if you have MyChart) OR A paper copy in the mail If you have any lab test that is abnormal or we need to change your treatment, we will call you to review the results.  Testing/Procedures:  CORONARY CALCIUM  SCORING CT SCAN AT THE [facility] ST FLOOR IMAGING DEPARTMENT  Your physician has requested that you have an echocardiogram. Echocardiography is a painless test that uses sound waves to create images of your heart. It provides your doctor with information about the size and shape of your heart and how well your heart's chambers and valves are working. This procedure takes approximately one hour. There are no restrictions for this procedure. Please do NOT wear cologne, perfume, aftershave, or lotions (deodorant is allowed). Please arrive 15 minutes prior to your appointment time.  Please note: We ask at that you not bring children with you during ultrasound (echo/ vascular) testing. Due to room size and safety concerns, children are not allowed in the ultrasound rooms during exams. Our front office staff cannot provide observation of children in our lobby area while testing is being conducted. An adult accompanying a patient to their appointment will only be allowed in the ultrasound room at the discretion of the ultrasound technician under special circumstances. We apologize for any inconvenience. [facility] 1 ST FLOOR IMAGING DEPARTMENT  Follow-Up: At [facility], you and your health needs are our priority.  As part of our continuing mission to provide you with  exceptional heart care, our providers are all part of one team.  This team includes your primary Cardiologist (physician) and Advanced Practice Providers or APPs (Physician Assistants and Nurse Practitioners) who all work together to provide you with the care you need, when you need it.  Your next appointment:   12 month(s)  Provider:   REDELL SHALLOW MD

## 2023-09-03 ENCOUNTER — Ambulatory Visit: Admitting: Obstetrics and Gynecology

## 2023-09-03 ENCOUNTER — Encounter: Payer: Self-pay | Admitting: Obstetrics and Gynecology

## 2023-09-03 ENCOUNTER — Other Ambulatory Visit (HOSPITAL_COMMUNITY)
Admission: RE | Admit: 2023-09-03 | Discharge: 2023-09-03 | Disposition: A | Discharge status: Home / Self Care | Attending: Obstetrics and Gynecology | Admitting: Obstetrics and Gynecology

## 2023-09-03 VITALS — BP 114/80 | HR 77 | Ht 63.0 in | Wt 219.8 lb

## 2023-09-03 DIAGNOSIS — D72828 Other elevated white blood cell count: Secondary | ICD-10-CM | POA: Diagnosis not present

## 2023-09-03 DIAGNOSIS — Z87442 Personal history of urinary calculi: Secondary | ICD-10-CM

## 2023-09-03 DIAGNOSIS — M6289 Other specified disorders of muscle: Secondary | ICD-10-CM

## 2023-09-03 DIAGNOSIS — R82998 Other abnormal findings in urine: Secondary | ICD-10-CM

## 2023-09-03 DIAGNOSIS — N952 Postmenopausal atrophic vaginitis: Secondary | ICD-10-CM | POA: Diagnosis not present

## 2023-09-03 DIAGNOSIS — Z87440 Personal history of urinary (tract) infections: Secondary | ICD-10-CM | POA: Diagnosis present

## 2023-09-03 DIAGNOSIS — N390 Urinary tract infection, site not specified: Secondary | ICD-10-CM

## 2023-09-03 DIAGNOSIS — R350 Frequency of micturition: Secondary | ICD-10-CM

## 2023-09-03 DIAGNOSIS — R319 Hematuria, unspecified: Secondary | ICD-10-CM | POA: Insufficient documentation

## 2023-09-03 LAB — URINALYSIS, ROUTINE W REFLEX MICROSCOPIC
Bilirubin Urine: NEGATIVE
Glucose, UA: NEGATIVE mg/dL
Hgb urine dipstick: NEGATIVE
Ketones, ur: NEGATIVE mg/dL
Nitrite: NEGATIVE
Protein, ur: 30 mg/dL — AB
Specific Gravity, Urine: 1.013 (ref 1.005–1.030)
pH: 6 (ref 5.0–8.0)

## 2023-09-03 LAB — POCT URINALYSIS DIP (CLINITEK)
Bilirubin, UA: NEGATIVE
Glucose, UA: NEGATIVE mg/dL
Ketones, POC UA: NEGATIVE mg/dL
Nitrite, UA: NEGATIVE
POC PROTEIN,UA: 30 — AB
Spec Grav, UA: 1.02 (ref 1.010–1.025)
Urobilinogen, UA: 0.2 U/dL
pH, UA: 6.5 (ref 5.0–8.0)

## 2023-09-03 MED ORDER — ESTRADIOL 0.1 MG/GM VA CREA: 0.5000 g | TOPICAL_CREAM | VAGINAL | 11 refills | Status: DC

## 2023-09-03 MED ORDER — METHENAMINE HIPPURATE 1 G PO TABS: 1.0000 g | ORAL_TABLET | Freq: Every day | ORAL | 1 refills | Status: DC

## 2023-09-03 MED ORDER — METHENAMINE HIPPURATE 1 G PO TABS
1.0000 g | ORAL_TABLET | Freq: Every day | ORAL | 1 refills | Status: DC
Start: 2023-09-03 — End: 2023-10-29

## 2023-09-03 NOTE — Patient Instructions (Addendum)
 Constipation: Our goal is to achieve formed bowel movements daily or every-other-day.  You may need to try different combinations of the following options to find what works best for you - everybody's body works differently so feel free to adjust the dosages as needed.  Some options to help maintain bowel health include:  Dietary changes (more leafy greens, vegetables and fruits; less processed foods) Fiber supplementation (Benefiber, FiberCon, Metamucil or Psyllium). Start slow and increase gradually to full dose. Over-the-counter agents such as: stool softeners (Docusate or Colace) and/or laxatives (Miralax, milk of magnesia)  Power Pudding is a natural mixture that may help your constipation.  To make blend 1 cup applesauce, 1 cup wheat bran, and 3/4 cup prune juice, refrigerate and then take 1 tablespoon daily with a large glass of water as needed.   For the estrogen cream use it nightly for two weeks and then twice weekly  Today we talked about ways to manage bladder urgency such as altering your diet to avoid irritative beverages and foods (bladder diet) as well as attempting to decrease stress and other exacerbating factors.    The Most Bothersome Foods* The Least Bothersome Foods*  Coffee - Regular & Decaf Tea - caffeinated Carbonated beverages - cola, non-colas, diet & caffeine-free Alcohols - Beer, Red Wine, White Wine, [address] - Grapefruit, [place], Orange, [employer] - Cranberry, Grapefruit, Orange, Pineapple Vegetables - Tomato & Tomato Products Flavor Enhancers - Hot peppers, Spicy foods, Chili, Horseradish, Vinegar, Monosodium glutamate (MSG) Artificial Sweeteners - NutraSweet, Sweet 'N Low, Equal (sweetener), Saccharin Ethnic foods - [place], [place], [place] food [employer] - low-fat & whole Fruits - Bananas, Blueberries, Honeydew melon, Pears, Raisins, Watermelon Vegetables - Broccoli, [address] Sprouts, [place], Carrots, Cauliflower, [place], Cucumber,  Mushrooms, Peas, Radishes, Squash, Zucchini, White potatoes, Sweet potatoes & yams Poultry - Chicken, Eggs, [place], [employer] - Beef, Diplomatic Services operational officer, Lamb Seafood - Shrimp, [place] fish, Salmon Grains - Oat, Rice Snacks - Pretzels, Popcorn  *Mitch ALF et al. Diet and its role in interstitial cystitis/bladder pain syndrome (IC/BPS) and comorbid conditions. BJU International. BJU Int. 2012 Jan 11.
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
## 2023-09-04 ENCOUNTER — Ambulatory Visit: Payer: Self-pay | Admitting: Obstetrics and Gynecology

## 2023-09-04 LAB — URINE CULTURE

## 2023-09-10 ENCOUNTER — Other Ambulatory Visit: Payer: Self-pay | Admitting: Family

## 2023-09-10 ENCOUNTER — Other Ambulatory Visit (HOSPITAL_BASED_OUTPATIENT_CLINIC_OR_DEPARTMENT_OTHER): Payer: Self-pay

## 2023-09-11 ENCOUNTER — Other Ambulatory Visit (HOSPITAL_COMMUNITY): Payer: Self-pay

## 2023-09-11 ENCOUNTER — Other Ambulatory Visit: Payer: Self-pay

## 2023-09-11 ENCOUNTER — Telehealth: Payer: Self-pay

## 2023-09-11 ENCOUNTER — Other Ambulatory Visit (HOSPITAL_BASED_OUTPATIENT_CLINIC_OR_DEPARTMENT_OTHER): Payer: Self-pay

## 2023-09-11 MED ORDER — TIRZEPATIDE-WEIGHT MANAGEMENT 5 MG/0.5ML ~~LOC~~ SOAJ: 5.0000 mg | SUBCUTANEOUS | 1 refills | Status: AC

## 2023-09-11 MED ORDER — ZEPBOUND 5 MG/0.5ML ~~LOC~~ SOAJ
5.0000 mg | SUBCUTANEOUS | 1 refills | Status: DC
  Filled 2023-09-11: qty 2, 280d supply, fill #0
  Filled 2023-09-11: qty 2, 28d supply, fill #0
  Filled 2023-10-09: qty 2, 28d supply, fill #1

## 2023-09-11 NOTE — Addendum Note (Signed)
 Addended by: DORLENE CHIQUITA RAMAN on: 09/11/2023 02:37 PM   Modules accepted: Orders

## 2023-09-11 NOTE — Telephone Encounter (Signed)
 Rx sent.

## 2023-09-11 NOTE — Telephone Encounter (Signed)
 Copied from CRM #[id number]. Topic: Clinical - Medication Question >> Sep 11, 2023  1:28 PM Thersia C wrote: Reason for CRM: Patient called in regarding tirzepatide  (ZEPBOUND ) 5 MG/0.5ML Pen , stated she is going out of town and would like it before the end of the day would like a nurse yo give her a callback

## 2023-09-24 ENCOUNTER — Ambulatory Visit (HOSPITAL_BASED_OUTPATIENT_CLINIC_OR_DEPARTMENT_OTHER)
Admission: RE | Admit: 2023-09-24 | Discharge: 2023-09-24 | Disposition: A | Discharge status: Home / Self Care | Source: Ambulatory Visit | Attending: Obstetrics and Gynecology

## 2023-09-24 ENCOUNTER — Ambulatory Visit (HOSPITAL_BASED_OUTPATIENT_CLINIC_OR_DEPARTMENT_OTHER)
Admission: RE | Admit: 2023-09-24 | Discharge: 2023-09-24 | Disposition: A | Discharge status: Home / Self Care | Source: Ambulatory Visit | Attending: Cardiology | Admitting: Cardiology

## 2023-09-24 ENCOUNTER — Ambulatory Visit: Attending: Obstetrics and Gynecology | Admitting: Physical Therapy

## 2023-09-24 ENCOUNTER — Ambulatory Visit (HOSPITAL_BASED_OUTPATIENT_CLINIC_OR_DEPARTMENT_OTHER)
Admission: RE | Admit: 2023-09-24 | Discharge: 2023-09-24 | Disposition: A | Discharge status: Home / Self Care | Payer: Self-pay | Source: Ambulatory Visit | Attending: Cardiology | Admitting: Cardiology

## 2023-09-24 DIAGNOSIS — Z87442 Personal history of urinary calculi: Secondary | ICD-10-CM | POA: Insufficient documentation

## 2023-09-24 DIAGNOSIS — R319 Hematuria, unspecified: Secondary | ICD-10-CM | POA: Insufficient documentation

## 2023-09-24 DIAGNOSIS — M6281 Muscle weakness (generalized): Secondary | ICD-10-CM | POA: Diagnosis present

## 2023-09-24 DIAGNOSIS — R279 Unspecified lack of coordination: Secondary | ICD-10-CM | POA: Insufficient documentation

## 2023-09-24 DIAGNOSIS — R0602 Shortness of breath: Secondary | ICD-10-CM | POA: Diagnosis present

## 2023-09-24 DIAGNOSIS — R293 Abnormal posture: Secondary | ICD-10-CM | POA: Insufficient documentation

## 2023-09-24 DIAGNOSIS — I700 Atherosclerosis of aorta: Secondary | ICD-10-CM | POA: Insufficient documentation

## 2023-09-24 LAB — ECHOCARDIOGRAM COMPLETE
AR max vel: 2.04 cm2
AV Area VTI: 2.03 cm2
AV Area mean vel: 2.03 cm2
AV Mean grad: 3 mmHg
AV Peak grad: 4.8 mmHg
Ao pk vel: 1.1 m/s
Area-P 1/2: 2.68 cm2
Calc EF: 64.7 %
S' Lateral: 2.9 cm
Single Plane A2C EF: 58.5 %
Single Plane A4C EF: 69.3 %

## 2023-09-24 MED ORDER — PERFLUTREN LIPID MICROSPHERE
1.0000 mL | INTRAVENOUS | Status: AC | PRN
  Administered 2023-09-24: 2 mL via INTRAVENOUS

## 2023-09-24 MED ORDER — IOHEXOL 300 MG/ML  SOLN: 100.0000 mL | Freq: Once | INTRAMUSCULAR | Status: DC | PRN

## 2023-09-24 NOTE — Therapy (Signed)
 OUTPATIENT PHYSICAL THERAPY FEMALE PELVIC EVALUATION   Patient Name: Emily Davenport MRN: [id number] DOB:06-30-1960, 63 y.o., female Today's Date: 09/24/2023  END OF SESSION:  PT End of Session - 09/24/23 1641     Visit Number 1    Number of Visits 3    Date for PT Re-Evaluation 12/24/23    Authorization Type [employer]    PT Start Time 0330    PT Stop Time 0415    PT Time Calculation (min) 45 min    Activity Tolerance Patient tolerated treatment well    Behavior During Therapy [facility] for tasks assessed/performed          Past Medical History:  Diagnosis Date   Allergies    Anxiety    on meds   Arthritis    Benign essential HTN 09/30/2015   Bilateral swelling of feet    Depression    Fatty liver    GERD (gastroesophageal reflux disease)    on meds   H/O sinusitis 05/11/2015   High serum parathyroid  hormone (PTH) 08/18/2018   History of chicken pox 05/11/2015   Hx of endometriosis 03/28/2013   Hyperlipidemia    IBS (irritable bowel syndrome)    Insomnia    Kidney problem    Knee pain    Multiple sclerosis (HCC)    PKD (polycystic kidney disease)    Renal insufficiency 12/20/2013   Seasonal allergies 05/11/2015   Skin cancer    basal and sqaumous cell - removed   Sleep apnea    does not use CPAP   Tear of lateral meniscus of right knee    Vitamin D  deficiency    Past Surgical History:  Procedure Laterality Date   BLADDER SURGERY  2009   sling   BLEPHAROPLASTY Bilateral 2021   COLONOSCOPY  2014   RG-Prepopik-good prep-normal - 10 yr recall   Finger Joint Surgery     KNEE ARTHROSCOPY WITH MEDIAL MENISECTOMY Right 08/15/2013   Procedure: RIGHT KNEE ARTHROSCOPY WITH DEBRIDEMENT/SHAVING (CHRONDRPLASTY), MEDIAL AND LATERAL MENISECTOMY;  Surgeon: Lamar DELENA Millman, MD;  Location:  SUR[facility]ERY [facility];  Service: Orthopedics;  Laterality: Right;   NASAL SINUS SURGERY  2000   planterfaciitis  1998   right   RHINOPLASTY  1979   UPPER GI ENDOSCOPY  1985    2021   VAGINAL HYSTERECTOMY  2010   WISDOM TOOTH EXTRACTION     Patient Active Problem List   Diagnosis Date Noted   Nausea 07/22/2023   Preventative health care 10/07/2022   Hyperglycemia 11/21/2019   Headache 11/21/2019   Close exposure to COVID-19 virus 10/19/2019   Recurrent UTI 09/06/2019   History of urinary stone 09/06/2019   Hypertensive disorder 09/06/2019   Open nondisplaced fracture of base of fifth metacarpal bone of left hand 03/30/2019   Rhinitis, chronic 12/27/2018   Aortic atherosclerosis (HCC) 12/05/2018   Restless sleeper 12/05/2018   OSA (obstructive sleep apnea) 12/05/2018   Vitamin D  deficiency 08/18/2018   High serum parathyroid  hormone (PTH) 08/18/2018   Obesity 08/18/2018   Benign essential HTN 09/30/2015   History of chicken pox 05/11/2015   H/O sinusitis 05/11/2015   Seasonal allergies 05/11/2015   Elevated LFTs 02/06/2014   Renal insufficiency 12/20/2013   Bilateral renal cysts 12/20/2013   Microscopic hematuria 12/20/2013   Tear of lateral meniscus of right knee    Insomnia 04/11/2013   Anxiety 03/28/2013   GERD (gastroesophageal reflux disease) 03/28/2013   Renal calculi 03/28/2013   Hyperlipidemia 03/28/2013  IBS (irritable bowel syndrome) 03/28/2013   Hx of endometriosis 03/28/2013   S/P total hysterectomy and BSO (bilateral salpingo-oophorectomy) 03/28/2013   Multiple sclerosis (HCC) 03/28/2013   Diverticulosis 03/28/2013   PCP:    Domenica Harlene LABOR, MD    REFERRING PROVIDER: Zuleta, Kaitlin G, NP  REFERRING DIAG: [phone] (ICD-10-CM) - Pelvic floor dysfunction  THERAPY DIAG:  Muscle weakness (generalized)  Unspecified lack of coordination  Abnormal posture  Rationale for Evaluation and Treatment: Rehabilitation  ONSET DATE: unknown   SUBJECTIVE:                                                                                                                                                                                            SUBJECTIVE STATEMENT: Patient reports that she recently had a pelvic exam and she was informed of deep pelvic floor tension. She reports occasional urinary urgency that sometimes causes mild leakage, but rarely. She reports that she has no other issues in the pelvis, she is not sure if she needs much pelvic PT. She doesn't have to wear a pad at all. No bowel concerns to report, currently taking Zepbound  5mg . She had bladder sling surgery in 2009 and full hysterectomy in 2010 and this helped with the leakage she was experiencing in the past. She is also taking estradiol  due to painful intercourse and dryness.   PAIN:  Are you having pain? No NPRS scale: 0/10  PRECAUTIONS: None  RED FLAGS: None   WEIGHT BEARING RESTRICTIONS: No  FALLS:  Has patient fallen in last 6 months? No  OCCUPATION: yes part time for [employer] [place] - sitting majority of the day   ACTIVITY LEVEL : walking 3 times per week, walking to lunch   PLOF: Independent with basic ADLs  PATIENT GOALS: figure out what is going on with her pelvic floor and if she needs PT   PERTINENT HISTORY:  Bladder sling procedure 2009, hysterectomy 2010  Sexual abuse: No  BOWEL MOVEMENT: Pain with bowel movement: No Type of bowel movement:Type (Bristol Stool Scale) looser end, Frequency within normal limits , Strain no, and Splinting no Fully empty rectum: Yes:   Leakage: No Pads: No Fiber supplement/laxative No  URINATION: Pain with urination: No Fully empty bladder: Yes:   Stream: Strong Urgency: Yes  Frequency: within normal limits  Leakage: none Pads: No  INTERCOURSE:  Ability to have vaginal penetration Yes  Pain with intercourse: Initial Penetration and During Penetration DrynessYes  Climax: yes Marinoff Scale: 1/3  PROLAPSE: None  OBJECTIVE:  Note: Objective measures were completed at Evaluation unless otherwise noted. PATIENT SURVEYS:  PFIQ-7: 15  COGNITION: Overall  cognitive status: Within  functional limits for tasks assessed     SENSATION: Light touch: Appears intact  LUMBAR SPECIAL TESTS:  Single leg stance test: Positive  FUNCTIONAL TESTS:  Functional sit to stand: general   GAIT: Assistive device utilized: None Comments: moderate trendelenburg gait pattern with ambulation   POSTURE: rounded shoulders, forward head, and flexed trunk   LUMBARAROM/PROM: within normal limits for all motions bilaterally with no pain   LOWER EXTREMITY ROM: within normal limits for all motions bilaterally with no pain   LOWER EXTREMITY MMT: 4/5 bilateral knees and hips grossly   PALPATION:   General: no tenderness to palpation of bilateral adductors or hip flexors in supine   Pelvic Alignment: within normal limits   Abdominal: upper chest breathing, abdominal bracing and decreased lower rib excursion with inhalation                 External Perineal Exam: dryness present with redness in and around external labia, some dry skin peeling on outer labia also                              Internal Pelvic Floor: Pt demonstrates normal muscle tension at superficial pelvic floor, but deeper she demonstrates high muscle tone, specifically in lateral aspects of pelvic floor bilaterally. She had slight pain with palpation of left sided obturator that did not increase during the duration of exam. She has a weak pelvic floor contraction that is uncoordinated in nature. She tends to have a stronger pelvic floor contraction when paired with an exhale.   Patient confirms identification and approves PT to assess internal pelvic floor and treatment Yes No emotional/communication barriers or cognitive limitation. Patient is motivated to learn. Patient understands and agrees with treatment goals and plan. PT explains patient will be examined in standing, sitting, and lying down to see how their muscles and joints work. When they are ready, they will be asked to remove their underwear so PT can examine their  perineum. The patient is also given the option of providing their own chaperone as one is not provided in our facility. The patient also has the right and is explained the right to defer or refuse any part of the evaluation or treatment including the internal exam. With the patient's consent, PT will use one gloved finger to gently assess the muscles of the pelvic floor, seeing how well it contracts and relaxes and if there is muscle symmetry. After, the patient will get dressed and PT and patient will discuss exam findings and plan of care. PT and patient discuss plan of care, schedule, attendance policy and HEP activities.  PELVIC MMT:   MMT eval  Vaginal 3/5, 10 quick flicks, 5 second hold  Internal Anal Sphincter   External Anal Sphincter   Puborectalis   Diastasis Recti   (Blank rows = not tested)  TONE: Low at superficial pelvic floor bilaterally, high in deep pelvic floor musculature bilaterally  PROLAPSE: N/A  TODAY'S TREATMENT:  DATE:   EVAL 09/24/23: Examination completed, findings reviewed, pt educated on POC, HEP, and self care. Pt motivated to participate in PT and agreeable to attempt recommendations.   Neuro re-ed: Hooklying diaphragmatic breathing + pelvic floor lengthening with inhalation + shortening with exhalation 2x10  Hooklying diaphragmatic breathing + pelvic floor quick flick contractions 2x10  Self care: Relative anatomy and the connection between the diaphragm and pelvic floor; water intake and bladder irritants; vaginal moisturizers vs lubricants with samples provided   PATIENT EDUCATION:  Education details: Relative anatomy and the connection between the diaphragm and pelvic floor; water intake and bladder irritants; vaginal moisturizers vs lubricants with samples provided  Person educated: Patient Education method: Explanation,  Demonstration, Tactile cues, Verbal cues, and Handouts Education comprehension: verbalized understanding, returned demonstration, verbal cues required, tactile cues required, and needs further education  HOME EXERCISE PROGRAM: Access Code: [email or web address] URL: [email or web address] Date: 09/24/2023 Prepared by: Celena Domino  Exercises - Supine Pelvic Floor Contraction  - 1 x daily - 7 x weekly - 3 sets - 10 reps - Quick Flick Pelvic Floor Contractions in Hooklying  - 1 x daily - 7 x weekly - 3 sets - 10 reps  ASSESSMENT:  CLINICAL IMPRESSION: Patient is a 63 y.o. female  who was seen today for physical therapy evaluation and treatment for pelvic floor dysfunction. She has had a bladder sling procedure in 2009 and a full hysterectomy in 2010. Since then, she will occasionally have urinary urgency that causes very mild leakage, but this is rare. She fully consents to today's internal vaginal examination. Pt demonstrates normal muscle tension at superficial pelvic floor, but deeper she demonstrates high muscle tone, specifically in lateral aspects of pelvic floor bilaterally. She had slight pain with palpation of left sided obturator that did not increase during the duration of exam. She has a weak pelvic floor contraction that is uncoordinated in nature. She tends to have a stronger pelvic floor contraction when paired with an exhale. Overall, patient has mild pelvic floor dysfunction and she tolerated this evaluation very well with no pain, Pt would benefit from additional PT to further address deficits.    OBJECTIVE IMPAIRMENTS: decreased coordination, decreased endurance, decreased mobility, decreased ROM, and decreased strength.   ACTIVITY LIMITATIONS: continence  PARTICIPATION LIMITATIONS: community activity  PERSONAL FACTORS: Age, Past/current experiences, and Time since onset of injury/illness/exacerbation are also affecting patient's functional outcome.   REHAB POTENTIAL:  Good  CLINICAL DECISION MAKING: Stable/uncomplicated  EVALUATION COMPLEXITY: Low   GOALS: Goals reviewed with patient? Yes  SHORT TERM GOALS: Target date: 10/22/2023  Pt will be independent with HEP.  Baseline: Goal status: INITIAL  2.  Pt will be independent with diaphragmatic breathing and down training activities in order to improve pelvic floor relaxation. Baseline:  Goal status: INITIAL  3.  Pt will be independent with the knack, urge suppression technique, and double voiding in order to improve bladder habits and decrease urinary incontinence.  Baseline:  Goal status: INITIAL  4.  Pt will be able to correctly perform diaphragmatic breathing and appropriate pressure management in order to prevent worsening vaginal wall laxity and improve pelvic floor A/ROM.  Baseline:  Goal status: INITIAL  LONG TERM GOALS: Target date: 03/23/2024  Pt will be independent with advanced HEP.  Baseline:  Goal status: INITIAL  2.  Pt to demonstrate improved coordination of pelvic floor and breathing mechanics with 10# squat with appropriate synergistic patterns to decrease pain and leakage at least 75% of the time for improved  ability to complete a 30 minute workout with strain at pelvic floor and symptoms.   Baseline:  Goal status: INITIAL  3.  Pt will report no leaks with laughing, coughing, sneezing in order to improve comfort with interpersonal relationships and community activities.  Baseline:  Goal status: INITIAL  4.  Pt will be able to teach back and utilize urge suppression technique in order to help reduce number of trips to the bathroom.   Baseline:  Goal status: INITIAL  PLAN:  PT FREQUENCY: 1-2x/week  PT DURATION: 6 months  PLANNED INTERVENTIONS: 97110-Therapeutic exercises, 97530- Therapeutic activity, 97112- Neuromuscular re-education, 97535- Self Care, [id number]- Manual therapy, Patient/Family education, Taping, Joint mobilization, Spinal mobilization, Scar mobilization,  Cryotherapy, and Moist heat  PLAN FOR NEXT SESSION: continued pelvic floor AROM in seated, introduce loading with pelvic floor control, discuss toileting techniques and complete emptying techniques, manual to back and glutes   Celena JAYSON Domino, PT 09/24/2023, 4:43 PM

## 2023-09-25 ENCOUNTER — Ambulatory Visit: Payer: Self-pay | Admitting: Cardiology

## 2023-10-05 ENCOUNTER — Encounter: Payer: Self-pay | Admitting: Obstetrics and Gynecology

## 2023-10-05 NOTE — Telephone Encounter (Signed)
 Called and spoke to patient regarding US . We discussed the results and that it would be a good idea to repeat cystoscopy in the office. Previous report from cysto in 2021 had no biopsy, but did show Negative for high grade urothelial carcinoma. Urothelial cells, squamous cells, mild inflammation and red blood cells present.   Patient reports she plans to go personally ask for [facility] records [facility] as we have not yet gotten a full history.   Patient is open to another cysto in our office.   We discussed that regarding the kidney stones, urology is the best place for follow up and if she would like a referral to a different office then we are more than willing to do whatever makes her comfortable if she would like another opinion on the stones and bilateral hydronephrosis which has been previously reported on renal scans. She will consider her options and we will plan to work through her plan of care one step at a time and together.

## 2023-10-09 ENCOUNTER — Other Ambulatory Visit (HOSPITAL_BASED_OUTPATIENT_CLINIC_OR_DEPARTMENT_OTHER): Payer: Self-pay

## 2023-10-10 ENCOUNTER — Other Ambulatory Visit: Payer: Self-pay | Admitting: Family Medicine

## 2023-10-12 ENCOUNTER — Encounter: Payer: Self-pay | Admitting: Family Medicine

## 2023-10-13 MED ORDER — METOPROLOL SUCCINATE ER 100 MG PO TB24: 100.0000 mg | ORAL_TABLET | Freq: Two times a day (BID) | ORAL | 1 refills | Status: AC

## 2023-10-18 NOTE — Assessment & Plan Note (Signed)
 Hydrate and monitor

## 2023-10-18 NOTE — Assessment & Plan Note (Signed)
Encouraged good sleep hygiene such as dark, quiet room. No blue/green glowing lights such as computer screens in bedroom. No alcohol or stimulants in evening. Cut down on caffeine as able. Regular exercise is helpful but not just prior to bed time. Ambien XR prn

## 2023-10-18 NOTE — Assessment & Plan Note (Signed)
 Patient encouraged to maintain heart healthy diet, regular exercise, adequate sleep. Consider daily probiotics. Take medications as prescribed. Labs ordered and reviewed Given and reviewed copy of ACP documents from [facility] Secretary of State and encouraged to complete and return  Colonoscopy 11/2022 repeat in 11/31 Pap MGM 09/2022 repeat every 1-2 years Dexa

## 2023-10-18 NOTE — Assessment & Plan Note (Signed)
 Supplement and monitor

## 2023-10-18 NOTE — Assessment & Plan Note (Signed)
 Well controlled, no changes to meds. Encouraged heart healthy diet such as the DASH diet and exercise as tolerated.

## 2023-10-18 NOTE — Assessment & Plan Note (Signed)
 Encourage heart healthy diet such as MIND or DASH diet, increase exercise, avoid trans fats, simple carbohydrates and processed foods, consider a krill or fish or flaxseed oil cap daily. Tolerating Atorvastatin

## 2023-10-18 NOTE — Assessment & Plan Note (Signed)
 Encouraged DASH or MIND diet, decrease po intake and increase exercise as tolerated. Needs 7-8 hours of sleep nightly. Avoid trans fats, eat small, frequent meals every 4-5 hours with lean proteins, complex carbs and healthy fats. Minimize simple carbs, high fat foods and processed foods. Tolerating Zepbound 

## 2023-10-18 NOTE — Assessment & Plan Note (Signed)
 hgba1c acceptable, minimize simple carbs. Increase exercise as tolerated.

## 2023-10-22 ENCOUNTER — Other Ambulatory Visit: Payer: Self-pay | Admitting: Family Medicine

## 2023-10-22 ENCOUNTER — Encounter: Payer: Self-pay | Admitting: Family Medicine

## 2023-10-22 ENCOUNTER — Ambulatory Visit: Payer: 59 | Admitting: Family Medicine

## 2023-10-22 ENCOUNTER — Ambulatory Visit: Payer: Self-pay | Admitting: Family Medicine

## 2023-10-22 VITALS — BP 130/86 | HR 65 | Temp 98.0°F | Resp 16 | Ht 63.0 in | Wt 206.2 lb

## 2023-10-22 DIAGNOSIS — I10 Essential (primary) hypertension: Secondary | ICD-10-CM | POA: Diagnosis not present

## 2023-10-22 DIAGNOSIS — E66812 Obesity, class 2: Secondary | ICD-10-CM

## 2023-10-22 DIAGNOSIS — R748 Abnormal levels of other serum enzymes: Secondary | ICD-10-CM

## 2023-10-22 DIAGNOSIS — E559 Vitamin D deficiency, unspecified: Secondary | ICD-10-CM | POA: Diagnosis not present

## 2023-10-22 DIAGNOSIS — Z1231 Encounter for screening mammogram for malignant neoplasm of breast: Secondary | ICD-10-CM

## 2023-10-22 DIAGNOSIS — E785 Hyperlipidemia, unspecified: Secondary | ICD-10-CM

## 2023-10-22 DIAGNOSIS — F5101 Primary insomnia: Secondary | ICD-10-CM

## 2023-10-22 DIAGNOSIS — Z23 Encounter for immunization: Secondary | ICD-10-CM | POA: Diagnosis not present

## 2023-10-22 DIAGNOSIS — E2839 Other primary ovarian failure: Secondary | ICD-10-CM

## 2023-10-22 DIAGNOSIS — R739 Hyperglycemia, unspecified: Secondary | ICD-10-CM

## 2023-10-22 DIAGNOSIS — Z0000 Encounter for general adult medical examination without abnormal findings: Secondary | ICD-10-CM

## 2023-10-22 DIAGNOSIS — N289 Disorder of kidney and ureter, unspecified: Secondary | ICD-10-CM

## 2023-10-22 DIAGNOSIS — Z780 Asymptomatic menopausal state: Secondary | ICD-10-CM

## 2023-10-22 DIAGNOSIS — R7989 Other specified abnormal findings of blood chemistry: Secondary | ICD-10-CM

## 2023-10-22 DIAGNOSIS — Z6836 Body mass index (BMI) 36.0-36.9, adult: Secondary | ICD-10-CM

## 2023-10-22 LAB — CBC WITH DIFFERENTIAL/PLATELET
Basophils Absolute: 0 K/uL (ref 0.0–0.1)
Basophils Relative: 0.3 % (ref 0.0–3.0)
Eosinophils Absolute: 0.1 K/uL (ref 0.0–0.7)
Eosinophils Relative: 1.7 % (ref 0.0–5.0)
HCT: 41.9 % (ref 36.0–46.0)
Hemoglobin: 13.9 g/dL (ref 12.0–15.0)
Lymphocytes Relative: 41.5 % (ref 12.0–46.0)
Lymphs Abs: 3.1 K/uL (ref 0.7–4.0)
MCHC: 33.3 g/dL (ref 30.0–36.0)
MCV: 84.6 fl (ref 78.0–100.0)
Monocytes Absolute: 0.6 K/uL (ref 0.1–1.0)
Monocytes Relative: 8 % (ref 3.0–12.0)
Neutro Abs: 3.6 K/uL (ref 1.4–7.7)
Neutrophils Relative %: 48.5 % (ref 43.0–77.0)
Platelets: 245 K/uL (ref 150.0–400.0)
RBC: 4.95 Mil/uL (ref 3.87–5.11)
RDW: 13.8 % (ref 11.5–15.5)
WBC: 7.4 K/uL (ref 4.0–10.5)

## 2023-10-22 LAB — LIPID PANEL
Cholesterol: 118 mg/dL (ref 0–200)
HDL: 44 mg/dL (ref >39.00)
LDL Cholesterol: 48 mg/dL (ref 0–99)
NonHDL: 73.85
Total CHOL/HDL Ratio: 3
Triglycerides: 128 mg/dL (ref 0.0–149.0)
VLDL: 25.6 mg/dL (ref 0.0–40.0)

## 2023-10-22 LAB — VITAMIN D 25 HYDROXY (VIT D DEFICIENCY, FRACTURES): VITD: 43.93 ng/mL (ref 30.00–100.00)

## 2023-10-22 LAB — COMPREHENSIVE METABOLIC PANEL WITH GFR
ALT: 24 U/L (ref 0–35)
AST: 21 U/L (ref 0–37)
Albumin: 4.4 g/dL (ref 3.5–5.2)
Alkaline Phosphatase: 116 U/L (ref 39–117)
BUN: 25 mg/dL — ABNORMAL HIGH (ref 6–23)
CO2: 27 meq/L (ref 19–32)
Calcium: 10.4 mg/dL (ref 8.4–10.5)
Chloride: 103 meq/L (ref 96–112)
Creatinine, Ser: 1.03 mg/dL (ref 0.40–1.20)
GFR: 57.97 mL/min — ABNORMAL LOW (ref >60.00)
Glucose, Bld: 97 mg/dL (ref 70–99)
Potassium: 4.3 meq/L (ref 3.5–5.1)
Sodium: 138 meq/L (ref 135–145)
Total Bilirubin: 0.5 mg/dL (ref 0.2–1.2)
Total Protein: 7.5 g/dL (ref 6.0–8.3)

## 2023-10-22 LAB — TSH: TSH: 0.79 u[IU]/mL (ref 0.35–5.50)

## 2023-10-22 LAB — HEMOGLOBIN A1C: Hgb A1c MFr Bld: 5.6 % (ref 4.6–6.5)

## 2023-10-22 NOTE — Assessment & Plan Note (Signed)
 Will check isoenyzmes today

## 2023-10-22 NOTE — Telephone Encounter (Signed)
 Requesting: lorazepam  1mg   Contract: 08/07/21 UDS:10/2/5 Last Visit: 10/22/23 Next Visit:04/21/24 Last Refill: 07/02/23 #60 and 3RF   Please Advise

## 2023-10-22 NOTE — Progress Notes (Signed)
 Subjective:    Patient ID: Emily Davenport Medicine, female    DOB: [birth year], 63 y.o.   MRN: [id number]  Chief Complaint  Patient presents with   Annual Exam    HPI Discussed the use of AI scribe software for clinical note transcription with the patient, who gave verbal consent to proceed.  History of Present Illness Emily Davenport is a 63 year old female who presents for a follow-up visit.  She has experienced significant weight loss, losing 41 pounds over the past year, currently weighing 206 pounds. She attributes this to the use of Zepbound , which she finds beneficial in managing her obesity. She is on a 5 mg dose and experiences constipation as a side effect, which she manages with Mag 07, a magnesium supplement, taken once a week. She is considering increasing the frequency to twice a week to improve bowel regularity.  She has a history of hypertension and is currently on two blood pressure medications, including clonidine . She is hopeful that continued weight loss might allow for a reduction in her medication regimen.  She underwent a total hysterectomy and is postmenopausal, which she describes as challenging, especially during the pandemic. She has a history of kidney stones and is concerned about a large stone that has been stable but could potentially cause issues if it moves.  She has a family history of osteoporosis and is considering a bone density scan, as her mother had osteoporosis.  She experiences insomnia and anxiety, for which she takes medication as needed. She reports difficulty falling asleep due to a constantly active mind and is exploring cognitive behavioral therapy for insomnia as an additional strategy.  She has a history of internal hemorrhoids and recently noticed a bulge, which she suspects is a hemorrhoid. She has not experienced pain or bleeding but is considering over-the-counter treatments if symptoms worsen.  She is concerned about her medication list and  wonders if she is 'that sick,' noting that she takes medications for blood pressure, cholesterol, and other conditions. She is interested in simplifying her regimen if possible.    Past Medical History:  Diagnosis Date   Allergies    Allergy 1970   Anxiety 2000   on meds   Arthritis    Right knee/left shoulder   Benign essential HTN 09/30/2015   Bilateral swelling of feet    Depression    Fatty liver    GERD (gastroesophageal reflux disease) 2015   on meds   H/O sinusitis 05/11/2015   High serum parathyroid  hormone (PTH) 08/18/2018   History of chicken pox 05/11/2015   Hx of endometriosis 03/28/2013   Hyperlipidemia 2020   Cholesterol  has improved greatly   IBS (irritable bowel syndrome)    Insomnia    Kidney problem    Knee pain    Multiple sclerosis    PKD (polycystic kidney disease)    Renal insufficiency 12/20/2013   Seasonal allergies 05/11/2015   Skin cancer    basal and sqaumous cell - removed   Sleep apnea 10/2019   does not use CPAP   Tear of lateral meniscus of right knee    Vitamin D  deficiency     Past Surgical History:  Procedure Laterality Date   BLADDER SURGERY  2009   sling   BLEPHAROPLASTY Bilateral 2021   COLONOSCOPY  2014   RG-Prepopik-good prep-normal - 10 yr recall   COSMETIC SURGERY  12/2019   Eyelid   Finger Joint Surgery     KNEE ARTHROSCOPY  WITH MEDIAL MENISECTOMY Right 08/15/2013   Procedure: RIGHT KNEE ARTHROSCOPY WITH DEBRIDEMENT/SHAVING (CHRONDRPLASTY), MEDIAL AND LATERAL MENISECTOMY;  Surgeon: Lamar DELENA Millman, MD;  Location: [facility];  Service: Orthopedics;  Laterality: Right;   NASAL SINUS SURGERY  2000   planterfaciitis  1998   right   RHINOPLASTY  1979   UPPER GI ENDOSCOPY  1985   2021   VAGINAL HYSTERECTOMY  2010   WISDOM TOOTH EXTRACTION      Family History  Problem Relation Age of Onset   Kidney disease Mother    Osteoporosis Mother    COPD Mother    Hypertension Mother    Hyperlipidemia Mother     Obesity Mother    Miscarriages / [place] Mother    Colon polyps Father 88   Hypertension Father    Kidney disease Father        kidney stones and cancer   Alzheimer's disease Father    Hyperlipidemia Father    Throat cancer Father 37   Kidney cancer Father 80   Hypertension Sister    Hyperlipidemia Sister    Heart disease Maternal Grandmother    Prostate cancer Maternal Grandfather    Alzheimer's disease Paternal Grandmother    Drug abuse Paternal Grandmother    Heart disease Paternal Grandfather    Drug abuse Paternal Grandfather    Colon cancer Neg Hx    Esophageal cancer Neg Hx    Stomach cancer Neg Hx    Rectal cancer Neg Hx     Social History   Socioeconomic History   Marital status: Married    Spouse name: Herlinda Heady   Number of children: Not on file   Years of education: Not on file   Highest education level: Bachelor's degree (e.g., BA, AB, BS)  Occupational History   Not on file  Tobacco Use   Smoking status: Never   Smokeless tobacco: Never  Vaping Use   Vaping status: Never Used  Substance and Sexual Activity   Alcohol use: Not Currently    Comment: occ   Drug use: Never   Sexual activity: Yes    Birth control/protection: Post-menopausal, Other-see comments    Comment: Hysterectomy 2010  Other Topics Concern   Not on file  Social History Narrative   Retired Hotel manager, lives with husband   Social Drivers of Corporate investment banker Strain: Low Risk  (07/15/2023)   Overall Financial Resource Strain (CARDIA)    Difficulty of Paying Living Expenses: Not hard at all  Food Insecurity: No Food Insecurity (07/15/2023)   Hunger Vital Sign    Worried About Running Out of Food in the Last Year: Never true    Ran Out of Food in the Last Year: Never true  Transportation Needs: No Transportation Needs (07/15/2023)   PRAPARE - Administrator, Civil Service (Medical): No    Lack of Transportation (Non-Medical): No   Physical Activity: Insufficiently Active (07/15/2023)   Exercise Vital Sign    Days of Exercise per Week: 3 days    Minutes of Exercise per Session: 30 min  Stress: No Stress Concern Present (07/15/2023)   [employer] of Occupational Health - Occupational Stress Questionnaire    Feeling of Stress: Not at all  Social Connections: Socially Integrated (07/15/2023)   Social Connection and Isolation Panel    Frequency of Communication with Friends and Family: More than three times a week    Frequency of Social Gatherings with Friends and Family:  More than three times a week    Attends Religious Services: More than 4 times per year    Active Member of Clubs or Organizations: Yes    Attends Banker Meetings: More than 4 times per year    Marital Status: Married  Catering manager Violence: Not on file    Outpatient Medications Prior to Visit  Medication Sig Dispense Refill   aspirin EC 81 MG tablet Take 81 mg by mouth daily.     atorvastatin  (LIPITOR) 10 MG tablet TAKE 1 TABLET BY MOUTH DAILY 90 tablet 0   Cholecalciferol 50 MCG (2000 UT) TABS Take 1 tablet by mouth daily at 6 (six) AM.     cloNIDine  (CATAPRES ) 0.1 MG tablet TAKE 1 TABLET BY MOUTH THREE TIMES DAILY 270 tablet 1   diphenhydrAMINE (BENADRYL) 25 MG tablet Take 25 mg by mouth every 8 (eight) hours as needed for itching or allergies.     EPINEPHrine  0.3 mg/0.3 mL IJ SOAJ injection as directed Injection prn     estradiol  (ESTRACE ) 0.1 MG/GM vaginal cream Place 0.5 g vaginally 2 (two) times a week. Place 0.5g nightly for two weeks then twice a week after 42.5 g 11   ezetimibe  (ZETIA ) 10 MG tablet Take 1 tablet (10 mg total) by mouth daily. 90 tablet 3   famotidine  (PEPCID ) 40 MG tablet TAKE 1 TABLET BY MOUTH AT BEDTIME AS NEEDED FOR HEARTBURN OR INDIGESTION 90 tablet 1   ibuprofen (ADVIL) 200 MG tablet Take 200 mg by mouth every 6 (six) hours as needed.     Krill Oil (OMEGA-3) 500 MG CAPS Take 2 capsules by mouth  daily.     LORazepam  (ATIVAN ) 1 MG tablet Take 1 tablet (1 mg total) by mouth 2 (two) times daily. 60 tablet 3   methenamine  (HIPREX ) 1 g tablet Take 1 tablet (1 g total) by mouth daily. 30 tablet 1   metoprolol  succinate (TOPROL -XL) 100 MG 24 hr tablet Take 1 tablet (100 mg total) by mouth in the morning and at bedtime. 180 tablet 1   Multiple Vitamin (MULTIVITAMIN) tablet Take 1 tablet by mouth daily.     nitrofurantoin  (MACRODANTIN ) 50 MG capsule Take 1 capsule (50 mg total) by mouth daily as needed. 30 capsule 3   nystatin  ointment (MYCOSTATIN ) APPLY TOPICALLY TWICE DAILY 30 g 3   ondansetron  (ZOFRAN ) 4 MG tablet Take 1 tablet (4 mg total) by mouth every 8 (eight) hours as needed for nausea or vomiting. 30 tablet 3   Probiotic Product (PROBIOTIC DAILY) CAPS Take 1 capsule by mouth daily at 6 (six) AM.     tirzepatide  (ZEPBOUND ) 5 MG/0.5ML Pen Inject 5 mg into the skin once a week. 2 mL 1   tirzepatide  (ZEPBOUND ) 5 MG/0.5ML Pen Inject 5 mg into the skin once a week. 2 mL 1   zolpidem  (AMBIEN  CR) 12.5 MG CR tablet Take 1 tablet (12.5 mg total) by mouth at bedtime. 90 tablet 1   No facility-administered medications prior to visit.    No Known Allergies  Review of Systems  Constitutional:  Positive for malaise/fatigue. Negative for chills and fever.  HENT:  Negative for congestion and hearing loss.   Eyes:  Negative for discharge.  Respiratory:  Negative for cough, sputum production and shortness of breath.   Cardiovascular:  Negative for chest pain, palpitations and leg swelling.  Gastrointestinal:  Positive for constipation. Negative for abdominal pain, blood in stool, diarrhea, heartburn, nausea and vomiting.  Genitourinary:  Negative for  dysuria, frequency, hematuria and urgency.  Musculoskeletal:  Negative for back pain, falls and myalgias.  Skin:  Negative for rash.  Neurological:  Negative for dizziness, sensory change, loss of consciousness, weakness and headaches.   Endo/Heme/Allergies:  Negative for environmental allergies. Does not bruise/bleed easily.  Psychiatric/Behavioral:  Negative for depression and suicidal ideas. The patient is not nervous/anxious and does not have insomnia.        Objective:    Physical Exam Constitutional:      General: She is not in acute distress.    Appearance: Normal appearance. She is not diaphoretic.  HENT:     Head: Normocephalic and atraumatic.     Right Ear: Tympanic membrane, ear canal and external ear normal.     Left Ear: Tympanic membrane, ear canal and external ear normal.     Nose: Nose normal.     Mouth/Throat:     Mouth: Mucous membranes are moist.     Pharynx: Oropharynx is clear. No oropharyngeal exudate.  Eyes:     General: No scleral icterus.       Right eye: No discharge.        Left eye: No discharge.     Conjunctiva/sclera: Conjunctivae normal.     Pupils: Pupils are equal, round, and reactive to light.  Neck:     Thyroid : No thyromegaly.  Cardiovascular:     Rate and Rhythm: Normal rate and regular rhythm.     Heart sounds: Normal heart sounds. No murmur heard. Pulmonary:     Effort: Pulmonary effort is normal. No respiratory distress.     Breath sounds: Normal breath sounds. No wheezing or rales.  Abdominal:     General: Bowel sounds are normal. There is no distension.     Palpations: Abdomen is soft. There is no mass.     Tenderness: There is no abdominal tenderness.  Musculoskeletal:        General: No tenderness. Normal range of motion.     Cervical back: Normal range of motion and neck supple.  Lymphadenopathy:     Cervical: No cervical adenopathy.  Skin:    General: Skin is warm and dry.     Findings: No rash.  Neurological:     General: No focal deficit present.     Mental Status: She is alert and oriented to person, place, and time.     Cranial Nerves: No cranial nerve deficit.     Coordination: Coordination normal.     Deep Tendon Reflexes: Reflexes are normal and  symmetric. Reflexes normal.  Psychiatric:        Mood and Affect: Mood normal.        Behavior: Behavior normal.        Thought Content: Thought content normal.        Judgment: Judgment normal.     BP 130/86   Pulse 65   Temp 98 F (36.7 C)   Resp 16   Ht 5' 3 (1.6 m)   Wt 206 lb 3.2 oz (93.5 kg)   SpO2 96%   BMI 36.53 kg/m  Wt Readings from Last 3 Encounters:  10/22/23 206 lb 3.2 oz (93.5 kg)  09/03/23 219 lb 12.8 oz (99.7 kg)  09/02/23 218 lb (98.9 kg)    Diabetic Foot Exam - Simple   No data filed    Lab Results  Component Value Date   WBC 7.1 07/17/2023   HGB 14.0 07/17/2023   HCT 42.4 07/17/2023   PLT 254.0 07/17/2023  GLUCOSE 108 (H) 07/17/2023   CHOL 188 07/17/2023   TRIG 181.0 (H) 07/17/2023   HDL 55.30 07/17/2023   LDLCALC 97 07/17/2023   ALT 43 (H) 07/17/2023   AST 35 07/17/2023   NA 137 07/17/2023   K 4.2 07/17/2023   CL 103 07/17/2023   CREATININE 1.06 07/17/2023   BUN 22 07/17/2023   CO2 28 07/17/2023   TSH 1.55 07/17/2023   HGBA1C 6.4 07/17/2023    Lab Results  Component Value Date   TSH 1.55 07/17/2023   Lab Results  Component Value Date   WBC 7.1 07/17/2023   HGB 14.0 07/17/2023   HCT 42.4 07/17/2023   MCV 85.2 07/17/2023   PLT 254.0 07/17/2023   Lab Results  Component Value Date   NA 137 07/17/2023   K 4.2 07/17/2023   CO2 28 07/17/2023   GLUCOSE 108 (H) 07/17/2023   BUN 22 07/17/2023   CREATININE 1.06 07/17/2023   BILITOT 0.5 07/17/2023   ALKPHOS 160 (H) 07/17/2023   AST 35 07/17/2023   ALT 43 (H) 07/17/2023   PROT 7.4 07/17/2023   ALBUMIN 4.4 07/17/2023   CALCIUM  10.2 07/17/2023   GFR 56.11 (L) 07/17/2023   Lab Results  Component Value Date   CHOL 188 07/17/2023   Lab Results  Component Value Date   HDL 55.30 07/17/2023   Lab Results  Component Value Date   LDLCALC 97 07/17/2023   Lab Results  Component Value Date   TRIG 181.0 (H) 07/17/2023   Lab Results  Component Value Date   CHOLHDL 3  07/17/2023   Lab Results  Component Value Date   HGBA1C 6.4 07/17/2023       Assessment & Plan:  Preventative health care Assessment & Plan: Patient encouraged to maintain heart healthy diet, regular exercise, adequate sleep. Consider daily probiotics. Take medications as prescribed. Labs ordered and reviewed Given and reviewed copy of ACP documents from [facility] Secretary of State and encouraged to complete and return  Colonoscopy 11/2022 repeat in 11/31 Pap s/p hysterectomy, total MGM 09/2022 repeat every 1-2 years, ordered Dexa ordered  Orders: -     Comprehensive metabolic panel with GFR -     CBC with Differential/Platelet -     Lipid panel -     TSH -     Hemoglobin A1c -     VITAMIN D  25 Hydroxy (Vit-D Deficiency, Fractures)  High serum parathyroid  hormone (PTH) Assessment & Plan: Hydrate and monitor   Orders: -     TSH  Hyperglycemia Assessment & Plan: hgba1c acceptable, minimize simple carbs. Increase exercise as tolerated.   Orders: -     Hemoglobin A1c  Hyperlipidemia, unspecified hyperlipidemia type Assessment & Plan: Encourage heart healthy diet such as MIND or DASH diet, increase exercise, avoid trans fats, simple carbohydrates and processed foods, consider a krill or fish or flaxseed oil cap daily. Tolerating Atorvastatin   Orders: -     Lipid panel -     Lipoprotein A (LPA)  Hypertension, unspecified type Assessment & Plan: Well controlled, no changes to meds. Encouraged heart healthy diet such as the DASH diet and exercise as tolerated.    Orders: -     Comprehensive metabolic panel with GFR -     CBC with Differential/Platelet  Class 2 severe obesity with serious comorbidity and body mass index (BMI) of 36.0 to 36.9 in adult, unspecified obesity type Assessment & Plan: Encouraged DASH or MIND diet, decrease po intake and increase exercise as tolerated.  Needs 7-8 hours of sleep nightly. Avoid trans fats, eat small, frequent meals every 4-5 hours with  lean proteins, complex carbs and healthy fats. Minimize simple carbs, high fat foods and processed foods. Tolerating Zepbound    Renal insufficiency Assessment & Plan: Hydrate and monitor    Vitamin D  deficiency Assessment & Plan: Supplement and monitor   Orders: -     VITAMIN D  25 Hydroxy (Vit-D Deficiency, Fractures)  Primary insomnia Assessment & Plan: Encouraged good sleep hygiene such as dark, quiet room. No blue/green glowing lights such as computer screens in bedroom. No alcohol or stimulants in evening. Cut down on caffeine as able. Regular exercise is helpful but not just prior to bed time. Ambien  XR prn  Orders: -     Drug Monitoring Panel [phone] , Urine  Encounter for screening mammogram for malignant neoplasm of breast -     3D Screening Mammogram, Left and Right; Future  Estrogen deficiency -     DG Bone Density; Future  Post-menopausal -     DG Bone Density; Future  Need for pneumococcal 20-valent conjugate vaccination -     Pneumococcal conjugate vaccine 20-valent  Elevated alkaline phosphatase level Assessment & Plan: Will check isoenyzmes today  Orders: -     Alkaline phosphatase, isoenzymes    Assessment and Plan Assessment & Plan Renal insufficiency with bilateral hydronephrosis and nephrolithiasis Renal insufficiency with bilateral hydronephrosis and nephrolithiasis. Left side hydronephrosis worsening. Large kidney stone (14-15 mm) noted. - Consider referral to urologist for second opinion on kidney stone management. She is following up with her urogynecologist soon and will discuss  Recurrent urinary tract infections Recurrent urinary tract infections managed with prophylactic antibiotics.  Obesity, class 2, on GLP-1 therapy Class 2 obesity managed with GLP-1 therapy (Tirzepatide ). Current weight is 206 lbs, down 41 lbs from last year. She is satisfied with current dose of Tirzepatide  5 mg/0.5ML subcutaneous once a week. Discussed potential for  dose adjustment if weight loss plateaus or cravings return. - Continue Tirzepatide  5 mg/0.5ML subcutaneous once a week - Monitor weight and symptoms; consider dose adjustment if weight loss plateaus  Essential hypertension Hypertension is well-controlled with medication. Blood pressure today is 130/86 mmHg. Discussed potential for medication adjustment if weight loss continues. - Continue current antihypertensive regimen - Monitor blood pressure; consider medication adjustment if blood pressure drops with further weight loss  Hyperlipidemia Hyperlipidemia managed with Atorvastatin . Recent tests indicate good control. - Continue Atorvastatin  10 mg oral daily  Obstructive sleep apnea Obstructive sleep apnea managed with CPAP.  Hyperglycemia Hyperglycemia monitored.  Primary insomnia Primary insomnia managed with Zolpidem . Reports difficulty falling asleep. Discussed cognitive behavioral therapy for insomnia (CBTI) as an adjunct to medication. - Continue Zolpidem  12.5 mg oral at bedtime - Recommend CBTI app for insomnia management  Hemorrhoids Presence of hemorrhoids with occasional symptoms. Discussed over-the-counter treatments and potential for in-office procedures if symptoms worsen. - Use Witch Hazel for cleaning after bowel movements - Consider over-the-counter treatments like Preparation H if symptomatic - Monitor for symptoms; consider referral for in-office procedure if symptoms worsen  Intertrigo with secondary candidiasis Intertrigo with secondary candidiasis in skin folds. Managed with topical Nystatin . Discussed use of Witch Hazel for cleaning and potential for systemic treatment if needed. - Continue Nystatin  ointment topical twice daily - Use Witch Hazel for cleaning affected areas - Consider Diflucan  if no improvement  Adult Wellness Visit Routine adult wellness visit. Discussed general health maintenance, including screenings and vaccinations. - Order bone density  scan for  baseline assessment - Order mammogram for breast cancer screening - Administer Prevnar 20 vaccine for pneumococcal pneumonia prevention  Recording duration: 47 minutes     Harlene Horton, MD

## 2023-10-22 NOTE — Patient Instructions (Addendum)
 CBTi, cognitive behavioral therapy insomnia APP by [employer] protein shake-----------------------------------------------------------  Preventive Care 78-63 Years Old, Female Preventive care refers to lifestyle choices and visits with your health care provider that can promote health and wellness. Preventive care visits are also called wellness exams. What can I expect for my preventive care visit? Counseling Your health care provider may ask you questions about your: Medical history, including: Past medical problems. Family medical history. Pregnancy history. Current health, including: Menstrual cycle. Method of birth control. Emotional well-being. Home life and relationship well-being. Sexual activity and sexual health. Lifestyle, including: Alcohol, nicotine or tobacco, and drug use. Access to firearms. Diet, exercise, and sleep habits. Work and work Astronomer. Sunscreen use. Safety issues such as seatbelt and bike helmet use. Physical exam Your health care provider will check your: Height and weight. These may be used to calculate your BMI (body mass index). BMI is a measurement that tells if you are at a healthy weight. Waist circumference. This measures the distance around your waistline. This measurement also tells if you are at a healthy weight and may help predict your risk of certain diseases, such as type 2 diabetes and high blood pressure. Heart rate and blood pressure. Body temperature. Skin for abnormal spots. What immunizations do I need?  Vaccines are usually given at various ages, according to a schedule. Your health care provider will recommend vaccines for you based on your age, medical history, and lifestyle or other factors, such as travel or where you work. What tests do I need? Screening Your health care provider may recommend screening tests for certain conditions. This may include: Lipid and cholesterol levels. Diabetes  screening. This is done by checking your blood sugar (glucose) after you have not eaten for a while (fasting). Pelvic exam and Pap test. Hepatitis B test. Hepatitis C test. HIV (human immunodeficiency virus) test. STI (sexually transmitted infection) testing, if you are at risk. Lung cancer screening. Colorectal cancer screening. Mammogram. Talk with your health care provider about when you should start having regular mammograms. This may depend on whether you have a family history of breast cancer. BRCA-related cancer screening. This may be done if you have a family history of breast, ovarian, tubal, or peritoneal cancers. Bone density scan. This is done to screen for osteoporosis. Talk with your health care provider about your test results, treatment options, and if necessary, the need for more tests. Follow these instructions at home: Eating and drinking  Eat a diet that includes fresh fruits and vegetables, whole grains, lean protein, and low-fat dairy products. Take vitamin and mineral supplements as recommended by your health care provider. Do not drink alcohol if: Your health care provider tells you not to drink. You are pregnant, may be pregnant, or are planning to become pregnant. If you drink alcohol: Limit how much you have to 0-1 drink a day. Know how much alcohol is in your drink. In the [place] one drink equals one 12 oz bottle of beer (355 mL), one 5 oz glass of wine (148 mL), or one 1 oz glass of hard liquor (44 mL). Lifestyle Brush your teeth every morning and night with fluoride toothpaste. Floss one time each day. Exercise for at least 30 minutes 5 or more days each week. Do not use any products that contain nicotine or tobacco. These products include cigarettes, chewing tobacco, and vaping devices, such as e-cigarettes. If you need help quitting, ask your health care provider. Do not use drugs. If you are sexually  active, practice safe sex. Use a condom or other form of  protection to prevent STIs. If you do not wish to become pregnant, use a form of birth control. If you plan to become pregnant, see your health care provider for a prepregnancy visit. Take aspirin only as told by your health care provider. Make sure that you understand how much to take and what form to take. Work with your health care provider to find out whether it is safe and beneficial for you to take aspirin daily. Find healthy ways to manage stress, such as: Meditation, yoga, or listening to music. Journaling. Talking to a trusted person. Spending time with friends and family. Minimize exposure to UV radiation to reduce your risk of skin cancer. Safety Always wear your seat belt while driving or riding in a vehicle. Do not drive: If you have been drinking alcohol. Do not ride with someone who has been drinking. When you are tired or distracted. While texting. If you have been using any mind-altering substances or drugs. Wear a helmet and other protective equipment during sports activities. If you have firearms in your house, make sure you follow all gun safety procedures. Seek help if you have been physically or sexually abused. What's next? Visit your health care provider once a year for an annual wellness visit. Ask your health care provider how often you should have your eyes and teeth checked. Stay up to date on all vaccines. This information is not intended to replace advice given to you by your health care provider. Make sure you discuss any questions you have with your health care provider. Document Revised: 07/04/2020 Document Reviewed: 07/04/2020 Elsevier Patient Education  2024 [employer].

## 2023-10-25 LAB — DRUG MONITORING PANEL 376104, URINE
Alphahydroxyalprazolam: NEGATIVE ng/mL (ref <25)
Alphahydroxymidazolam: NEGATIVE ng/mL (ref <50)
Alphahydroxytriazolam: NEGATIVE ng/mL (ref <50)
Aminoclonazepam: NEGATIVE ng/mL (ref <25)
Amphetamines: NEGATIVE ng/mL (ref <500)
Barbiturates: NEGATIVE ng/mL (ref <300)
Benzodiazepines: POSITIVE ng/mL — AB (ref <100)
Cocaine Metabolite: NEGATIVE ng/mL (ref <150)
Desmethyltramadol: NEGATIVE ng/mL (ref <100)
Hydroxyethylflurazepam: NEGATIVE ng/mL (ref <50)
Lorazepam: 357 ng/mL — ABNORMAL HIGH (ref <50)
Nordiazepam: NEGATIVE ng/mL (ref <50)
Opiates: NEGATIVE ng/mL (ref <100)
Oxazepam: NEGATIVE ng/mL (ref <50)
Oxycodone: NEGATIVE ng/mL (ref <100)
Temazepam: NEGATIVE ng/mL (ref <50)
Tramadol: NEGATIVE ng/mL (ref <100)

## 2023-10-25 LAB — DM TEMPLATE

## 2023-10-26 ENCOUNTER — Other Ambulatory Visit: Payer: Self-pay | Admitting: Obstetrics and Gynecology

## 2023-10-26 LAB — LIPOPROTEIN A (LPA): Lipoprotein (a): 190 nmol/L — ABNORMAL HIGH (ref <75)

## 2023-10-27 LAB — ALKALINE PHOSPHATASE, ISOENZYMES
Alkaline Phosphatase: 131 IU/L (ref 49–135)
BONE FRACTION: 50 % (ref 14–68)
INTESTINAL FRAC.: 3 % (ref 0–18)
LIVER FRACTION: 47 % (ref 18–85)

## 2023-10-29 ENCOUNTER — Encounter: Payer: Self-pay | Admitting: Obstetrics and Gynecology

## 2023-11-03 ENCOUNTER — Other Ambulatory Visit: Payer: Self-pay | Admitting: Family Medicine

## 2023-11-03 ENCOUNTER — Ambulatory Visit: Admitting: Physical Therapy

## 2023-11-03 ENCOUNTER — Other Ambulatory Visit (HOSPITAL_BASED_OUTPATIENT_CLINIC_OR_DEPARTMENT_OTHER): Payer: Self-pay

## 2023-11-04 ENCOUNTER — Other Ambulatory Visit (HOSPITAL_BASED_OUTPATIENT_CLINIC_OR_DEPARTMENT_OTHER): Payer: Self-pay

## 2023-11-04 MED ORDER — ZEPBOUND 5 MG/0.5ML ~~LOC~~ SOAJ
5.0000 mg | SUBCUTANEOUS | 1 refills | Status: DC
  Filled 2023-11-04: qty 2, 28d supply, fill #0
  Filled 2023-11-27: qty 2, 28d supply, fill #1

## 2023-11-06 ENCOUNTER — Ambulatory Visit: Admitting: Obstetrics and Gynecology

## 2023-11-10 ENCOUNTER — Ambulatory Visit: Admitting: Physical Therapy

## 2023-11-12 ENCOUNTER — Ambulatory Visit (HOSPITAL_BASED_OUTPATIENT_CLINIC_OR_DEPARTMENT_OTHER)
Admission: RE | Admit: 2023-11-12 | Discharge: 2023-11-12 | Disposition: A | Discharge status: Home / Self Care | Source: Ambulatory Visit | Attending: Family Medicine | Admitting: Family Medicine

## 2023-11-12 ENCOUNTER — Encounter (HOSPITAL_BASED_OUTPATIENT_CLINIC_OR_DEPARTMENT_OTHER): Payer: Self-pay

## 2023-11-12 DIAGNOSIS — E2839 Other primary ovarian failure: Secondary | ICD-10-CM | POA: Insufficient documentation

## 2023-11-12 DIAGNOSIS — Z1231 Encounter for screening mammogram for malignant neoplasm of breast: Secondary | ICD-10-CM

## 2023-11-12 DIAGNOSIS — Z780 Asymptomatic menopausal state: Secondary | ICD-10-CM | POA: Insufficient documentation

## 2023-11-13 ENCOUNTER — Encounter: Payer: Self-pay | Admitting: Family Medicine

## 2023-11-16 ENCOUNTER — Other Ambulatory Visit: Payer: Self-pay | Admitting: Family Medicine

## 2023-11-16 NOTE — Telephone Encounter (Signed)
 Requesting: Ambien  CR 12.5mg   Contract:10/22/23 UDS:10/22/23 Last Visit: 10/22/23 Next Visit: 04/21/24 Last Refill: 05/18/23 #90 and 1RF   Please Advise

## 2023-11-18 ENCOUNTER — Encounter: Payer: Self-pay | Admitting: Obstetrics and Gynecology

## 2023-11-18 ENCOUNTER — Ambulatory Visit: Admitting: Obstetrics and Gynecology

## 2023-11-18 VITALS — BP 143/85 | HR 63

## 2023-11-18 DIAGNOSIS — Z87440 Personal history of urinary (tract) infections: Secondary | ICD-10-CM | POA: Diagnosis not present

## 2023-11-18 DIAGNOSIS — Z87442 Personal history of urinary calculi: Secondary | ICD-10-CM

## 2023-11-18 DIAGNOSIS — M6289 Other specified disorders of muscle: Secondary | ICD-10-CM

## 2023-11-18 DIAGNOSIS — R350 Frequency of micturition: Secondary | ICD-10-CM

## 2023-11-18 DIAGNOSIS — N390 Urinary tract infection, site not specified: Secondary | ICD-10-CM

## 2023-11-18 LAB — POC URINALSYSI DIPSTICK (AUTOMATED)
Bilirubin, UA: NEGATIVE
Blood, UA: NEGATIVE
Glucose, UA: NEGATIVE
Ketones, UA: NEGATIVE
Leukocytes, UA: NEGATIVE
Nitrite, UA: NEGATIVE
Protein, UA: NEGATIVE
Spec Grav, UA: 1.015 (ref 1.010–1.025)
Urobilinogen, UA: 0.2 U/dL
pH, UA: 6.5 (ref 5.0–8.0)

## 2023-11-18 NOTE — Progress Notes (Addendum)
 [facility] Urodynamics Procedure  Referring Physician: Domenica Harlene LABOR, MD Date of Procedure: 11/18/2023  Emily Davenport is a 63 y.o. female who presents for urodynamic evaluation. Indication(s) for study: occult SUI and mixed incontinence  Vital Signs: BP 120/82   Pulse 63   Laboratory Results: A catheterized urine specimen revealed:  POC urine:  Lab Results  Component Value Date   COLORU yellow\ 11/18/2023   CLARITYU clear 11/18/2023   GLUCOSEUR Negative 11/18/2023   BILIRUBINUR neg 11/18/2023   KETONESU neg 11/18/2023   SPECGRAV 1.015 11/18/2023   RBCUR neg 11/18/2023   PHUR 6.5 11/18/2023   PROTEINUR Negative 11/18/2023   UROBILINOGEN 0.2 11/18/2023   LEUKOCYTESUR Negative 11/18/2023     Voiding Diary: Deferred  Procedure Timeout:  The correct patient was verified and the correct procedure was verified. The patient was in the correct position and safety precautions were reviewed based on at the patient's history.  Urodynamic Procedure A 44F dual lumen urodynamics catheter was placed under sterile conditions into the patient's bladder. A 44F catheter was placed into the rectum in order to measure abdominal pressure. EMG patches were placed in the appropriate position.  All connections were confirmed and calibrations/adjusted made. Saline was instilled into the bladder through the dual lumen catheters.  Cough/valsalva pressures were measured periodically during filling.  Patient was allowed to void.  The bladder was then emptied of its residual.  UROFLOW: Revealed a Qmax of 5 mL/sec.  She voided 81 mL and had a residual of 20 mL.  It was a intermittent pattern and represented normal habits though interpretation limited due to low voided volume.  CMG: This was performed with sterile water in the sitting position at a fill rate of 30 mL/min.    First sensation of fullness was 70 mLs,  First urge was 305 mLs,  Strong urge was 380 mLs and  Capacity was  862 mLs  Stress incontinence was not demonstrated Highest negative CLPP was 152 cmH20 at 381 ml. Highest negative VLPP was 138 cmH20at 381 ml.   Detrusor function was normal, with mnimal phasic contractions seen.  The first occurred at 430 mL to 2 cm of water and was associated with urge.  Compliance:  Normal. End fill detrusor pressure was 4.4cmH20.  Calculated compliance was 144mL/cmH20  UPP: MUCP without barrier reduction was 72 cm of water.    MICTURITION STUDY: Voiding was performed without reduction in the sitting position.  Pdet at Qmax was 12 cm of water.  Qmax was 12 mL/sec.  It was a intermittent pattern.  She voided 762 mL and had a residual of 100 mL.  It was a volitional void, sustained detrusor contraction was present and abdominal straining was not present  EMG: This was performed with patches.  She had voluntary contractions, recruitment with fill was present and urethral sphincter was not relaxed with void.  The details of the procedure with the study tracings have been scanned into EPIC.   Urodynamic Impression:  1. Sensation was normal; capacity was increased 2. Stress Incontinence was not demonstrated at normal pressures; 3. Detrusor Overactivity was not demonstrated. 4. Emptying was normal with a normal PVR, a sustained detrusor contraction present,  abdominal straining not present, dyssynergic urethral sphincter activity on EMG.  Plan: -Patient to follow up for cysto with Dr. Guadlupe

## 2023-11-19 ENCOUNTER — Encounter: Payer: Self-pay | Admitting: Obstetrics and Gynecology

## 2023-11-26 ENCOUNTER — Ambulatory Visit: Payer: Self-pay | Admitting: Obstetrics

## 2023-11-26 ENCOUNTER — Encounter: Payer: Self-pay | Admitting: Obstetrics

## 2023-11-26 ENCOUNTER — Ambulatory Visit: Admitting: Obstetrics

## 2023-11-26 VITALS — BP 134/83 | HR 60

## 2023-11-26 DIAGNOSIS — Z87440 Personal history of urinary (tract) infections: Secondary | ICD-10-CM | POA: Diagnosis not present

## 2023-11-26 DIAGNOSIS — N390 Urinary tract infection, site not specified: Secondary | ICD-10-CM

## 2023-11-26 LAB — POCT URINALYSIS DIP (CLINITEK)
Bilirubin, UA: NEGATIVE
Glucose, UA: NEGATIVE mg/dL
Ketones, POC UA: NEGATIVE mg/dL
Leukocytes, UA: NEGATIVE
Nitrite, UA: NEGATIVE
POC PROTEIN,UA: NEGATIVE
Spec Grav, UA: 1.01 (ref 1.010–1.025)
Urobilinogen, UA: 0.2 U/dL
pH, UA: 7 (ref 5.0–8.0)

## 2023-11-26 NOTE — Progress Notes (Signed)
 CYSTOSCOPY  CC:  This is a 63 y.o. with recurrent UTI and history of left kidney stone and midurethral sling who presents today for cystoscopy.  Results for orders placed or performed in visit on 11/18/23  POCT Urinalysis Dipstick (Automated)   Collection Time: 11/18/23 10:16 AM  Result Value Ref Range   Color, UA yellow\    Clarity, UA clear    Glucose, UA Negative Negative   Bilirubin, UA neg    Ketones, UA neg    Spec Grav, UA 1.015 1.010 - 1.025   Blood, UA neg    pH, UA 6.5 5.0 - 8.0   Protein, UA Negative Negative   Urobilinogen, UA 0.2 0.2 or 1.0 E.U./dL   Nitrite, UA neg    Leukocytes, UA Negative Negative     BP 134/83   Pulse 60   CYSTOSCOPY: A time out was performed.  The periurethral area was prepped and draped in a sterile manner.  2% lidocaine  jetpack was inserted at the urethral meatus. The urethra and bladder were visualized with flexible cystoscope.  She had normal urethral coaptation and normal urethral mucosa.  She had normal bladder mucosa. She had bilateral clear efflux from both ureteral orifices.  She had no squamous metaplasia at the trigone, no trabeculations, cellules or diverticuli.     ASSESSMENT:  63 y.o. with recurrent UTI. Cystoscopy today is normal.  PLAN:  Follow-up to discuss findings and treatment options.  All questions answered and post-procedures instructions were given - encouraged to continue pelvic floor PT due to pelvic floor dyssynergia  - no mesh exposure on exam - pt to call regarding urology referral after checking with insurance coverage due to L > R hydronephrosis, recurrent UTI with a history of kidney stones  Lianne ONEIDA Gillis, MD

## 2023-11-26 NOTE — Patient Instructions (Addendum)
 Taking Care of Yourself after Urodynamics, Cystoscopy, Bulkamid Injection, or Botox Injection   Drink plenty of water for a day or two following your procedure. Try to have about 8 ounces (one cup) at a time, and do this 6 times or more per day unless you have fluid restrictitons AVOID irritative beverages such as coffee, tea, soda, alcoholic or citrus drinks for a day or two, as this may cause burning with urination.  For the first 1-2 days after the procedure, your urine may be pink or red in color. You may have some blood in your urine as a normal side effect of the procedure. Large amounts of bleeding or difficulty urinating are NOT normal. Call the nurse line if this happens or go to the nearest Emergency Room if the bleeding is heavy or you cannot urinate at all and it is after hours. If you had a Bulkamid injection in the urethra and need to be catheterized, ask for a pediatric catheter to be used (size 10 or 12-French) so the material is not pushed out of place.   You may experience some discomfort or a burning sensation with urination after having this procedure. You can use over the counter Azo or pyridium to help with burning and follow the instructions on the packaging. If it does not improve within 1-2 days, or other symptoms appear (fever, chills, or difficulty urinating) call the office to speak to a nurse.  You may return to normal daily activities such as work, school, driving, exercising and housework on the day of the procedure. If your doctor gave you a prescription, take it as ordered.    Please continue pelvic floor PT  Call and let us  know if you desire a referral to urology based on your insurance coverage.

## 2023-11-26 NOTE — Addendum Note (Signed)
 Addended by: KRYSTAL ANDREE GAILS on: 11/26/2023 10:54 AM   Modules accepted: Orders

## 2023-11-27 ENCOUNTER — Other Ambulatory Visit (HOSPITAL_BASED_OUTPATIENT_CLINIC_OR_DEPARTMENT_OTHER): Payer: Self-pay

## 2023-12-01 ENCOUNTER — Other Ambulatory Visit: Payer: Self-pay | Admitting: Family Medicine

## 2023-12-10 ENCOUNTER — Ambulatory Visit: Admitting: Physical Therapy

## 2023-12-16 ENCOUNTER — Other Ambulatory Visit: Payer: Self-pay | Admitting: Family Medicine

## 2023-12-19 MED ORDER — ZEPBOUND 5 MG/0.5ML ~~LOC~~ SOAJ
5.0000 mg | SUBCUTANEOUS | 1 refills | Status: DC
  Filled 2023-12-19: qty 2, 28d supply, fill #0
  Filled 2024-01-13: qty 2, 28d supply, fill #1

## 2023-12-20 ENCOUNTER — Other Ambulatory Visit (HOSPITAL_BASED_OUTPATIENT_CLINIC_OR_DEPARTMENT_OTHER): Payer: Self-pay

## 2023-12-21 ENCOUNTER — Other Ambulatory Visit (HOSPITAL_BASED_OUTPATIENT_CLINIC_OR_DEPARTMENT_OTHER): Payer: Self-pay

## 2023-12-22 ENCOUNTER — Other Ambulatory Visit: Payer: Self-pay | Admitting: Cardiology

## 2023-12-22 DIAGNOSIS — E785 Hyperlipidemia, unspecified: Secondary | ICD-10-CM

## 2023-12-23 ENCOUNTER — Ambulatory Visit: Payer: Self-pay | Attending: Obstetrics and Gynecology | Admitting: Physical Therapy

## 2023-12-23 DIAGNOSIS — M6281 Muscle weakness (generalized): Secondary | ICD-10-CM | POA: Insufficient documentation

## 2023-12-23 DIAGNOSIS — R279 Unspecified lack of coordination: Secondary | ICD-10-CM | POA: Diagnosis present

## 2023-12-23 NOTE — Therapy (Signed)
 OUTPATIENT PHYSICAL THERAPY FEMALE PELVIC TREATMENT/DISCHARGE SUMMARY    Patient Name: Emily Davenport MRN: [id number] DOB:November 13, 1960, 63 y.o., female Today's Date: 12/23/2023  END OF SESSION:    Past Medical History:  Diagnosis Date   Allergies    Allergy 1970   Anxiety 2000   on meds   Arthritis    Right knee/left shoulder   Benign essential HTN 09/30/2015   Bilateral swelling of feet    Depression    Fatty liver    GERD (gastroesophageal reflux disease) 2015   on meds   H/O sinusitis 05/11/2015   High serum parathyroid  hormone (PTH) 08/18/2018   History of chicken pox 05/11/2015   Hx of endometriosis 03/28/2013   Hyperlipidemia 2020   Cholesterol  has improved greatly   IBS (irritable bowel syndrome)    Insomnia    Kidney problem    Knee pain    Multiple sclerosis    PKD (polycystic kidney disease)    Renal insufficiency 12/20/2013   Seasonal allergies 05/11/2015   Skin cancer    basal and sqaumous cell - removed   Sleep apnea 10/2019   does not use CPAP   Tear of lateral meniscus of right knee    Vitamin D  deficiency    Past Surgical History:  Procedure Laterality Date   BLADDER SURGERY  2009   sling   BLEPHAROPLASTY Bilateral 2021   COLONOSCOPY  2014   RG-Prepopik-good prep-normal - 10 yr recall   COSMETIC SURGERY  12/2019   Eyelid   Finger Joint Surgery     KNEE ARTHROSCOPY WITH MEDIAL MENISECTOMY Right 08/15/2013   Procedure: RIGHT KNEE ARTHROSCOPY WITH DEBRIDEMENT/SHAVING (CHRONDRPLASTY), MEDIAL AND LATERAL MENISECTOMY;  Surgeon: Lamar DELENA Millman, MD;  Location: [facility];  Service: Orthopedics;  Laterality: Right;   NASAL SINUS SURGERY  2000   planterfaciitis  1998   right   RHINOPLASTY  1979   UPPER GI ENDOSCOPY  1985   2021   VAGINAL HYSTERECTOMY  2010   WISDOM TOOTH EXTRACTION     Patient Active Problem List   Diagnosis Date Noted   Elevated alkaline phosphatase level 10/22/2023   Nausea 07/22/2023   Preventative  health care 10/07/2022   Hyperglycemia 11/21/2019   Headache 11/21/2019   Close exposure to COVID-19 virus 10/19/2019   Recurrent UTI 09/06/2019   History of urinary stone 09/06/2019   Hypertensive disorder 09/06/2019   Rhinitis, chronic 12/27/2018   Aortic atherosclerosis 12/05/2018   Restless sleeper 12/05/2018   OSA (obstructive sleep apnea) 12/05/2018   Vitamin D  deficiency 08/18/2018   High serum parathyroid  hormone (PTH) 08/18/2018   Obesity 08/18/2018   Benign essential HTN 09/30/2015   History of chicken pox 05/11/2015   H/O sinusitis 05/11/2015   Seasonal allergies 05/11/2015   Elevated LFTs 02/06/2014   Renal insufficiency 12/20/2013   Bilateral renal cysts 12/20/2013   Microscopic hematuria 12/20/2013   Tear of lateral meniscus of right knee    Insomnia 04/11/2013   Anxiety 03/28/2013   GERD (gastroesophageal reflux disease) 03/28/2013   Renal calculi 03/28/2013   Hyperlipidemia 03/28/2013   IBS (irritable bowel syndrome) 03/28/2013   Hx of endometriosis 03/28/2013   S/P total hysterectomy and BSO (bilateral salpingo-oophorectomy) 03/28/2013   Multiple sclerosis 03/28/2013   Diverticulosis 03/28/2013   PCP:    Domenica Harlene DELENA, MD    REFERRING PROVIDER: Zuleta, Kaitlin G, NP  REFERRING DIAG: M62.89 (ICD-10-CM) - Pelvic floor dysfunction  THERAPY DIAG:  Muscle weakness (generalized)  Unspecified lack  of coordination  Rationale for Evaluation and Treatment: Rehabilitation  ONSET DATE: unknown   SUBJECTIVE:                                                                                                                                                                                           SUBJECTIVE STATEMENT: No new urinary issues to report. She has had urodynamic testing performed and her bladder is doing very great. She has had no other pain or issues to report and has been consistent with HEP. She is amenable to discharge today.  From eval: Patient  reports that she recently had a pelvic exam and she was informed of deep pelvic floor tension. She reports occasional urinary urgency that sometimes causes mild leakage, but rarely. She reports that she has no other issues in the pelvis, she is not sure if she needs much pelvic PT. She doesn't have to wear a pad at all. No bowel concerns to report, currently taking Zepbound  5mg . She had bladder sling surgery in 2009 and full hysterectomy in 2010 and this helped with the leakage she was experiencing in the past. She is also taking estradiol  due to painful intercourse and dryness.   PAIN:  Are you having pain? No NPRS scale: 0/10  PRECAUTIONS: None  RED FLAGS: None   WEIGHT BEARING RESTRICTIONS: No  FALLS:  Has patient fallen in last 6 months? No  OCCUPATION: yes part time for [employer] [place] - sitting majority of the day   ACTIVITY LEVEL : walking 3 times per week, walking to lunch   PLOF: Independent with basic ADLs  PATIENT GOALS: figure out what is going on with her pelvic floor and if she needs PT   PERTINENT HISTORY:  Bladder sling procedure 2009, hysterectomy 2010  Sexual abuse: No  BOWEL MOVEMENT: Pain with bowel movement: No Type of bowel movement:Type (Bristol Stool Scale) looser end, Frequency within normal limits , Strain no, and Splinting no Fully empty rectum: Yes:   Leakage: No Pads: No Fiber supplement/laxative No  URINATION: Pain with urination: No Fully empty bladder: Yes:   Stream: Strong Urgency: Yes  Frequency: within normal limits  Leakage: none Pads: No  INTERCOURSE:  Ability to have vaginal penetration Yes  Pain with intercourse: Initial Penetration and During Penetration DrynessYes  Climax: yes Marinoff Scale: 1/3  PROLAPSE: None  OBJECTIVE:  Note: Objective measures were completed at Evaluation unless otherwise noted. PATIENT SURVEYS:  PFIQ-7: 15  COGNITION: Overall cognitive status: Within functional limits for tasks  assessed     SENSATION: Light touch: Appears intact  LUMBAR SPECIAL TESTS:  Single leg stance test: Positive  FUNCTIONAL TESTS:  Functional sit to stand: general   GAIT: Assistive device utilized: None Comments: moderate trendelenburg gait pattern with ambulation   POSTURE: rounded shoulders, forward head, and flexed trunk   LUMBARAROM/PROM: within normal limits for all motions bilaterally with no pain   LOWER EXTREMITY ROM: within normal limits for all motions bilaterally with no pain   LOWER EXTREMITY MMT: 4/5 bilateral knees and hips grossly   PALPATION:   General: no tenderness to palpation of bilateral adductors or hip flexors in supine   Pelvic Alignment: within normal limits   Abdominal: upper chest breathing, abdominal bracing and decreased lower rib excursion with inhalation                 External Perineal Exam: dryness present with redness in and around external labia, some dry skin peeling on outer labia also                              Internal Pelvic Floor: Pt demonstrates normal muscle tension at superficial pelvic floor, but deeper she demonstrates high muscle tone, specifically in lateral aspects of pelvic floor bilaterally. She had slight pain with palpation of left sided obturator that did not increase during the duration of exam. She has a weak pelvic floor contraction that is uncoordinated in nature. She tends to have a stronger pelvic floor contraction when paired with an exhale.   Patient confirms identification and approves PT to assess internal pelvic floor and treatment Yes No emotional/communication barriers or cognitive limitation. Patient is motivated to learn. Patient understands and agrees with treatment goals and plan. PT explains patient will be examined in standing, sitting, and lying down to see how their muscles and joints work. When they are ready, they will be asked to remove their underwear so PT can examine their perineum. The patient is  also given the option of providing their own chaperone as one is not provided in our facility. The patient also has the right and is explained the right to defer or refuse any part of the evaluation or treatment including the internal exam. With the patient's consent, PT will use one gloved finger to gently assess the muscles of the pelvic floor, seeing how well it contracts and relaxes and if there is muscle symmetry. After, the patient will get dressed and PT and patient will discuss exam findings and plan of care. PT and patient discuss plan of care, schedule, attendance policy and HEP activities.  PELVIC MMT:   MMT eval  Vaginal 3/5, 10 quick flicks, 5 second hold  Internal Anal Sphincter   External Anal Sphincter   Puborectalis   Diastasis Recti   (Blank rows = not tested)  TONE: Low at superficial pelvic floor bilaterally, high in deep pelvic floor musculature bilaterally  PROLAPSE: N/A  TODAY'S TREATMENT:  DATE:   EVAL 09/24/23: Examination completed, findings reviewed, pt educated on POC, HEP, and self care. Pt motivated to participate in PT and agreeable to attempt recommendations.   Neuro re-ed: Hooklying diaphragmatic breathing + pelvic floor lengthening with inhalation + shortening with exhalation 2x10  Hooklying diaphragmatic breathing + pelvic floor quick flick contractions 2x10  Self care: Relative anatomy and the connection between the diaphragm and pelvic floor; water intake and bladder irritants; vaginal moisturizers vs lubricants with samples provided   12/23/23: Hooklying diaphragmatic breathing + pelvic floor lengthening with inhalation + shortening with exhalation 2x10  Hooklying diaphragmatic breathing + pelvic floor quick flick contractions 2x10  Seated diaphragmatic breathing + pelvic floor lengthening with inhalation + shortening with  exhalation 2x10  Seated  diaphragmatic breathing + pelvic floor quick flick contractions 2x10   PATIENT EDUCATION:  Education details: Relative anatomy and the connection between the diaphragm and pelvic floor; water intake and bladder irritants; vaginal moisturizers vs lubricants with samples provided  Person educated: Patient Education method: Explanation, Demonstration, Tactile cues, Verbal cues, and Handouts Education comprehension: verbalized understanding, returned demonstration, verbal cues required, tactile cues required, and needs further education  HOME EXERCISE PROGRAM: Access Code: [email or web address] URL: [email or web address] Date: 12/23/2023 Prepared by: Celena Domino  Exercises - Supine Pelvic Floor Contraction  - 1 x daily - 7 x weekly - 3 sets - 10 reps - Quick Flick Pelvic Floor Contractions in Hooklying  - 1 x daily - 7 x weekly - 3 sets - 10 reps - Seated Pelvic Floor Contraction  - 1 x daily - 7 x weekly - 3 sets - 10 reps - Seated Quick Flick Pelvic Floor Contractions  - 1 x daily - 7 x weekly - 3 sets - 10 reps  Patient Education - Get To Know Your Pelvic Floor- Female - Get To Know Your Pelvic Floor- Female - Lifestyle Changes that Support Urinary Health - Urinary Incontinence  ASSESSMENT:  CLINICAL IMPRESSION: Patient is a 63 y.o. female  who was seen today for physical therapy evaluation and treatment for pelvic floor dysfunction. She has attended 2 sessions of PFPT and is no longer experiencing any pelvic floor symptoms (pain, leakage, constipation). Session tolerated well and pt feels confident with pelvic floor management moving forward. She is appropriate for discharge at this time.  OBJECTIVE IMPAIRMENTS: decreased coordination, decreased endurance, decreased mobility, decreased ROM, and decreased strength.   ACTIVITY LIMITATIONS: continence  PARTICIPATION LIMITATIONS: community activity  PERSONAL FACTORS: Age, Past/current experiences, and Time  since onset of injury/illness/exacerbation are also affecting patient's functional outcome.   REHAB POTENTIAL: Good  CLINICAL DECISION MAKING: Stable/uncomplicated  EVALUATION COMPLEXITY: Low   GOALS: Goals reviewed with patient? Yes  SHORT TERM GOALS: Target date: 10/22/2023  Pt will be independent with HEP.  Baseline: Goal status: GOAL MET  2.  Pt will be independent with diaphragmatic breathing and down training activities in order to improve pelvic floor relaxation. Baseline:  Goal status: GOAL MET  3.  Pt will be independent with the knack, urge suppression technique, and double voiding in order to improve bladder habits and decrease urinary incontinence.  Baseline:  Goal status: GOAL MET  4.  Pt will be able to correctly perform diaphragmatic breathing and appropriate pressure management in order to prevent worsening vaginal wall laxity and improve pelvic floor A/ROM.  Baseline:  Goal status: GOAL MET  LONG TERM GOALS: Target date: 03/23/2024  Pt will be independent with advanced HEP.  Baseline:  Goal status: GOAL MET  2.  Pt to demonstrate improved coordination of pelvic floor and breathing mechanics with 10# squat with appropriate synergistic patterns to decrease pain and leakage at least 75% of the time for improved ability to complete a 30 minute workout with strain at pelvic floor and symptoms.   Baseline:  Goal status: GOAL MET  3.  Pt will report no leaks with laughing, coughing, sneezing in order to improve comfort with interpersonal relationships and community activities.  Baseline:  Goal status: GOAL MET  4.  Pt will be able to teach back and utilize urge suppression technique in order to help reduce number of trips to the bathroom.   Baseline:  Goal status: GOAL MET  PHYSICAL THERAPY DISCHARGE SUMMARY  Visits from Start of Care: 2  Current functional level related to goals / functional outcomes: See above   Remaining deficits: See above    Education / Equipment: See above   Patient agrees to discharge. Patient goals were met. Patient is being discharged due to meeting the stated rehab goals.   Celena JAYSON Domino, PT 12/23/2023, 3:36 PM

## 2023-12-29 NOTE — Telephone Encounter (Signed)
 Requesting: Ambien  12.5mg  Contract: 10/22/23 UDS: 10/22/23 Last Visit: 10/22/23 Next Visit:  04/21/24 Last Refill: 11/16/23 #90 and 0RF   Please Advise

## 2023-12-31 ENCOUNTER — Encounter: Payer: Self-pay | Admitting: *Deleted

## 2024-01-04 ENCOUNTER — Other Ambulatory Visit: Payer: Self-pay | Admitting: Family Medicine

## 2024-01-06 ENCOUNTER — Other Ambulatory Visit (HOSPITAL_BASED_OUTPATIENT_CLINIC_OR_DEPARTMENT_OTHER): Payer: Self-pay

## 2024-01-08 ENCOUNTER — Other Ambulatory Visit: Payer: Self-pay | Admitting: Family

## 2024-01-11 ENCOUNTER — Other Ambulatory Visit: Payer: Self-pay | Admitting: Obstetrics and Gynecology

## 2024-01-13 ENCOUNTER — Other Ambulatory Visit (HOSPITAL_BASED_OUTPATIENT_CLINIC_OR_DEPARTMENT_OTHER): Payer: Self-pay

## 2024-01-16 ENCOUNTER — Encounter: Payer: Self-pay | Admitting: Student

## 2024-01-18 ENCOUNTER — Encounter: Payer: Self-pay | Admitting: Obstetrics and Gynecology

## 2024-01-18 DIAGNOSIS — N390 Urinary tract infection, site not specified: Secondary | ICD-10-CM

## 2024-01-18 MED ORDER — METHENAMINE HIPPURATE 1 G PO TABS: 1.0000 g | ORAL_TABLET | Freq: Every day | ORAL | 2 refills | Status: AC

## 2024-01-18 MED ORDER — ESTRADIOL 0.01 % VA CREA: 0.5000 g | TOPICAL_CREAM | VAGINAL | 11 refills | Status: AC

## 2024-01-27 ENCOUNTER — Other Ambulatory Visit: Payer: Self-pay

## 2024-01-27 ENCOUNTER — Other Ambulatory Visit: Payer: Self-pay | Admitting: Family

## 2024-01-27 ENCOUNTER — Other Ambulatory Visit (HOSPITAL_BASED_OUTPATIENT_CLINIC_OR_DEPARTMENT_OTHER): Payer: Self-pay

## 2024-01-27 MED ORDER — ZEPBOUND 5 MG/0.5ML ~~LOC~~ SOAJ
5.0000 mg | SUBCUTANEOUS | 1 refills | Status: AC
  Filled 2024-01-27 – 2024-02-04 (×5): qty 2, 28d supply, fill #0
  Filled 2024-02-25: qty 2, 28d supply, fill #1

## 2024-02-01 ENCOUNTER — Encounter: Payer: Self-pay | Admitting: *Deleted

## 2024-02-01 ENCOUNTER — Other Ambulatory Visit: Payer: Self-pay

## 2024-02-02 ENCOUNTER — Other Ambulatory Visit (HOSPITAL_BASED_OUTPATIENT_CLINIC_OR_DEPARTMENT_OTHER): Payer: Self-pay

## 2024-02-04 ENCOUNTER — Other Ambulatory Visit (HOSPITAL_BASED_OUTPATIENT_CLINIC_OR_DEPARTMENT_OTHER): Payer: Self-pay

## 2024-02-10 ENCOUNTER — Telehealth: Payer: Self-pay

## 2024-02-10 ENCOUNTER — Other Ambulatory Visit: Payer: Self-pay | Admitting: Family

## 2024-02-10 NOTE — Telephone Encounter (Signed)
 Copied from CRM [id number]. Topic: Appointments - Appointment Scheduling >> Feb 10, 2024  9:32 AM Berneda FALCON wrote: Patient has a TOC scheduled with Harlene Jolly 05/17/24 due to provider leaving. She would like to know if there are ANY cancellations at all, if she can be worked in to see Dr. Domenica before she leaves. She is anxious about her care and wants to see her any date and any time before she goes, if at all possible please. I left her on the schedule for 04/21/24 so that she can remain on a cancellation list for PCP. The template is blocked so I cannot see any appts on my end for PCP. Called CAL and she recommended to schedule TOC, which we did do.  Please call patient if there are ANY cancellations with Domenica at all or if we can get her worked in anywhere.   Patient callback is [phone] (home)

## 2024-02-11 ENCOUNTER — Other Ambulatory Visit: Payer: Self-pay | Admitting: Family Medicine

## 2024-02-11 NOTE — Telephone Encounter (Signed)
Appt already on waitlist.

## 2024-02-12 ENCOUNTER — Other Ambulatory Visit: Payer: Self-pay | Admitting: Family

## 2024-02-12 NOTE — Telephone Encounter (Signed)
 Requesting: Ambien  12.5mg   Contract: 10/22/23 UDS: 10/22/23 Last Visit: 10/22/23  Next Visit: 05/17/24 for toc w/ Harlene GRADE Last Refill: 12/29/23 #90 and 0RF   Please Advise

## 2024-02-20 ENCOUNTER — Other Ambulatory Visit: Payer: Self-pay | Admitting: Family Medicine

## 2024-02-26 ENCOUNTER — Ambulatory Visit: Admitting: Obstetrics and Gynecology

## 2024-03-14 ENCOUNTER — Ambulatory Visit: Admitting: Family Medicine

## 2024-03-29 ENCOUNTER — Ambulatory Visit: Admitting: Obstetrics

## 2024-04-21 ENCOUNTER — Ambulatory Visit: Admitting: Family Medicine

## 2024-05-17 ENCOUNTER — Encounter: Admitting: Student

## 2024-06-21 ENCOUNTER — Ambulatory Visit: Admitting: Cardiology
